# Patient Record
Sex: Female | Born: 1975 | Race: White | Hispanic: No | Smoking: Former smoker
Health system: Southern US, Community
[De-identification: ages and names within clinical notes are randomized; demographics above are authoritative.]

## PROBLEM LIST (undated history)

## (undated) DIAGNOSIS — F32A Depression, unspecified: Secondary | ICD-10-CM

## (undated) DIAGNOSIS — I2699 Other pulmonary embolism without acute cor pulmonale: Secondary | ICD-10-CM

## (undated) DIAGNOSIS — R569 Unspecified convulsions: Secondary | ICD-10-CM

## (undated) DIAGNOSIS — M1990 Unspecified osteoarthritis, unspecified site: Secondary | ICD-10-CM

## (undated) DIAGNOSIS — J45909 Unspecified asthma, uncomplicated: Secondary | ICD-10-CM

## (undated) DIAGNOSIS — F419 Anxiety disorder, unspecified: Secondary | ICD-10-CM

## (undated) DIAGNOSIS — Z7901 Long term (current) use of anticoagulants: Secondary | ICD-10-CM

## (undated) DIAGNOSIS — I82409 Acute embolism and thrombosis of unspecified deep veins of unspecified lower extremity: Secondary | ICD-10-CM

## (undated) DIAGNOSIS — G43909 Migraine, unspecified, not intractable, without status migrainosus: Secondary | ICD-10-CM

## (undated) HISTORY — DX: Unspecified asthma, uncomplicated: J45.909

## (undated) HISTORY — DX: Anxiety disorder, unspecified: F41.9

## (undated) HISTORY — DX: Depression, unspecified: F32.A

## (undated) HISTORY — DX: Migraine, unspecified, not intractable, without status migrainosus: G43.909

## (undated) HISTORY — DX: Long term (current) use of anticoagulants: Z79.01

---

## 2002-02-22 ENCOUNTER — Ambulatory Visit (HOSPITAL_COMMUNITY): Admission: RE | Admit: 2002-02-22 | Discharge: 2002-02-22 | Payer: Self-pay | Admitting: *Deleted

## 2002-02-22 ENCOUNTER — Encounter: Payer: Self-pay | Admitting: *Deleted

## 2002-04-28 ENCOUNTER — Ambulatory Visit (HOSPITAL_COMMUNITY): Admission: RE | Admit: 2002-04-28 | Discharge: 2002-04-28 | Payer: Self-pay | Admitting: *Deleted

## 2002-04-28 ENCOUNTER — Encounter: Payer: Self-pay | Admitting: *Deleted

## 2002-07-06 ENCOUNTER — Ambulatory Visit (HOSPITAL_COMMUNITY): Admission: AD | Admit: 2002-07-06 | Discharge: 2002-07-06 | Payer: Self-pay | Admitting: *Deleted

## 2002-07-09 ENCOUNTER — Inpatient Hospital Stay (HOSPITAL_COMMUNITY): Admission: AD | Admit: 2002-07-09 | Discharge: 2002-07-11 | Payer: Self-pay | Admitting: *Deleted

## 2008-03-28 ENCOUNTER — Emergency Department (HOSPITAL_COMMUNITY): Admission: EM | Admit: 2008-03-28 | Discharge: 2008-03-29 | Payer: Self-pay | Admitting: Emergency Medicine

## 2010-03-07 ENCOUNTER — Ambulatory Visit: Payer: Self-pay | Admitting: Cardiology

## 2010-11-23 NOTE — H&P (Signed)
NAMEARRIONA, PREST                           ACCOUNT NO.:  [id number]   MEDICAL RECORD NO.:  [id number]                   PATIENT TYPE:  OIB   LOCATION:  A415                                 FACILITY:  [facility]   PHYSICIAN:  Langley Gauss, M.D.                DATE OF BIRTH:  04-Sep-1975   DATE OF ADMISSION:  07/06/2002  DATE OF DISCHARGE:  07/06/2002                                HISTORY & PHYSICAL   HISTORY OF PRESENT ILLNESS:  This is a 35 year old gravida 3, para 2 at 12-  5/[redacted] weeks gestation who was seen in the office for a scheduled visit at  which time she complains of mostly back pain with occasional shooting of  this pain down into her legs.  The patient states that four days prior to  today she did have some significant menstrual-type cramping which has since  abated.  She denies any change in vaginal discharge, no leakage of fluid, no  vaginal bleeding.  The patient's prenatal course uncomplicated.  She is  known to have a normal glucose tolerance test of 112, O positive blood type,  has had serial ultrasounds which have documented adequate fetal growth and  normal anatomic survey.  The patient did have a first-trimester ultrasound  when she was complaining of left lower quadrant pelvic pain.  However, this  revealed a normal intrauterine pregnancy.  The patient does have a history  of depression.  She did restart Zoloft 50 mg p.o. q.h.s. on Dec 02, 2001.   PAST OBSTETRICAL HISTORY:  Vaginal delivery x 2.  Delivered at 37 and 38  weeks, 7 pounds 4 ounces, 8 pounds 1 ounce.  No problems or complications at  either labor or delivery.   PAST MEDICAL HISTORY:  Otherwise negative.   ALLERGIES:  No known drug allergies.   SOCIAL HISTORY:  The patient was smoking less than one pack per day at the  onset of the pregnancy and did plan on quitting.  Father of the baby is  named Jonny Ruiz, who works for [employer].  The patient herself is employed  in the home only.   PHYSICAL  EXAMINATION:  GENERAL:  In no acute distress.  VITAL SIGNS:  Height 5 feet 7-1/2 inches.  Prepregnancy weight 170, most  recent weight 253.  Blood pressure 132/76, pulse rate of 80, respiratory  rate is 20.  HEENT:  Negative.  No adenopathy.  NECK:  Supple. Thyroid is nonpalpable.  LUNGS:  Clear.  CARDIOVASCULAR:  Regular rate and rhythm.  ABDOMEN:  Soft and nontender.  No surgical scars are identified.  The  patient is vertex presentation by Leopold's maneuvers.  EXTREMITIES:  Normal, with only trace pretibial edema.  PELVIC:  Cervix 4 cm dilated, 60% effaced, vertex at a -2 station but well  applied to the cervix.  Fetal heart tones are auscultated in the 150s.    ASSESSMENT:  A  37-5/7 weeks intrauterine pregnancy, possibly in early labor  with cervical dilatation of 4 cm.  Will refer her to [facility] for  evaluation.  If the patient is noted to be having regular uterine  contractions and documents cervical change, will consider her to be in labor  and proceed with amniotomy in anticipation of delivery.                                               Langley Gauss, M.D.    DC/MEDQ  D:  07/06/2002  T:  07/06/2002  Job:  [id number]

## 2010-11-23 NOTE — H&P (Signed)
   Jaime Massey, Jaime Massey                         ACCOUNT NO.:  [id number]   MEDICAL RECORD NO.:  [id number]                   PATIENT TYPE:  INP   LOCATION:  A420                                 FACILITY:  [facility]   PHYSICIAN:  Langley Gauss, M.D.                DATE OF BIRTH:  11/20/1975   DATE OF ADMISSION:  07/09/2002  DATE OF DISCHARGE:                                HISTORY & PHYSICAL   ADDENDUM:  The patient was admitted on July 09, 2002 for induction of  labor.  Pertinently, the patient had been sent to [facility] on  July 06, 2002 with possible prodromal labor; however, she was determined  not to be in labor at that time and was discharged to home.  The patient's  plan of course and past medical history is unchanged since dictated history  and physical.  On examination today, there was noted to be a reassuring  fetal heart rate, cervix 5 cm dilated, 80% effaced, -1 station posterior  position noted.  A fetal scalp electrode was used with resultant rupture of  membranes.  Clear amniotic fluid is noted.   PLAN:  The patient will be observed for several hours for spontaneous labor,  and thereafter will be induced or augmented if clinically indicated.                                               Langley Gauss, M.D.    DC/MEDQ  D:  07/09/2002  T:  07/09/2002  Job:  [id number]

## 2010-11-23 NOTE — Op Note (Signed)
   NAMEALETTA, EDMUNDS                         ACCOUNT NO.:  [id number]   MEDICAL RECORD NO.:  [id number]                   PATIENT TYPE:  OIB   LOCATION:  A415                                 FACILITY:  [facility]   PHYSICIAN:  Langley Gauss, M.D.                DATE OF BIRTH:  10-15-1975   DATE OF PROCEDURE:  07/06/2002  DATE OF DISCHARGE:  07/06/2002                                 OPERATIVE REPORT   PROCEDURE:  Non-stress test interpretation.   DISCHARGE DIAGNOSIS:  Pregnancy at 37-5/7 weeks intrauterine pregnancy with  abdominal pain.   INTERPRETATION:  The patient was placed on an external fetal monitor  complaining of irregular uterine contractions.  The patient was noted to  have a fetal heart rate baseline of 140-155 with accelerations noted of  greater than 15 beats per minute times greater than 15 seconds' duration.  No fetal heart rate decelerations are noted.  Long-term variability is noted  to be normal.  External toco reveals no significant uterine activity  identified.   ASSESSMENT:  A 37-5/7 weeks' intrauterine pregnancy complaining of abdominal  pain with reactive non-stress test.  Non-stress test interpretation only.                                               Langley Gauss, M.D.    DC/MEDQ  D:  08/03/2002  T:  08/03/2002  Job:  [id number]

## 2010-11-23 NOTE — Discharge Summary (Signed)
   Jaime Massey, Jaime Massey                         ACCOUNT NO.:  [id number]   MEDICAL RECORD NO.:  [id number]                   PATIENT TYPE:  INP   LOCATION:  A420                                 FACILITY:  [facility]   PHYSICIAN:  Langley Gauss, M.D.                DATE OF BIRTH:  1976-05-17   DATE OF ADMISSION:  07/09/2002  DATE OF DISCHARGE:  07/11/2002                                 DISCHARGE SUMMARY   DIAGNOSIS:  A 38-week intrauterine pregnancy delivered.   DISPOSITION:  The patient is to follow up in the office in four weeks' time.  She is bottle feeding at time of discharge.   DISCHARGE MEDICATIONS:  1. Tylenol No.3.  2. Motrin.  3. HCTZ 25 mg p.o. daily p.r.n. fluid retention #30 with no refill.   PERTINENT LABORATORY STUDIES:  O+ blood type.  Hemoglobin 10.6, hematocrit  30.4, with a white count of 13.7.  RPR is nonreactive.   COMPLICATIONS OF HOSPITALIZATION:  Postpartum hemorrhage with estimated  blood loss of 600 cc.   HOSPITAL COURSE:  See previous dictation.  The patient did well postpartum  with no postpartum complications.  Ambulated well, was able to void without  difficulty, bonding well with the infant.  The patient was discharged in my  absence on July 11, 2002 according to cross-coverage call arrangements.                                               Langley Gauss, M.D.    DC/MEDQ  D:  07/16/2002  T:  07/17/2002  Job:  [id number]

## 2010-11-23 NOTE — Op Note (Signed)
Jaime Massey, Jaime Massey                         ACCOUNT NO.:  [id number]   MEDICAL RECORD NO.:  [id number]                   PATIENT TYPE:  INP   LOCATION:  A420                                 FACILITY:  [facility]   PHYSICIAN:  Langley Gauss, M.D.                DATE OF BIRTH:  May 13, 1976   DATE OF PROCEDURE:  07/09/2002  DATE OF DISCHARGE:  07/11/2002                                 OPERATIVE REPORT   DIAGNOSIS:  38-week intrauterine pregnancy for induction of labor.  Delivery  performed with spontaneous assisted vaginal delivery, 8 lb, 3.5 oz, female  infant, delivered over an intact perineum.   ANALGESIA FOR DELIVERY:  The patient was treated during the course of labor  with IV Nubain only.   Delivery was complicated by a postpartum hemorrhage secondary to uterine  atony with a total estimated blood loss of 600 cc.  This was managed with IV  Pitocin solution, as well as 1 ampule of IM Hemabate.   SUMMARY:  The patient admitted at [redacted] weeks gestation for induction of labor.  Amniotomy was performed with findings of clear amniotic fluid.  A fetal  scalp electrode was placed.  This documented a reassuring fetal heart rate  throughout the course of labor.  The patient progressed rapidly along the  labor curve to complete dilatation, after which time she was placed in the  dorsal lithotomy position, prepped and draped in the usual sterile manner.  She pushed well during the short second stage of labor and delivered in a  direct OA position over the intact perineum.  The mouth and the nares of the  infant were bulb suctioned of clear amniotic fluid.  Renewed expulsive  efforts resulted in spontaneous rotation to a left anterior shoulder  position.  Gentle downward traction, combined with expulsive efforts  resulted in delivery of the shoulders beneath the pubis symphysis without  difficulty.  The mouth and nares were bulb suctioned of clear amniotic  fluid.  Continued expulsive efforts  resulted in the delivery of the  remainder of the infant without difficulty.  A spontaneous and vigorous  breathe and cry was noted.  The umbilical cord was milked towards the  infant.  The cord was doubly clamped and cut, and the infant was placed on  the maternal abdomen for immediate bonding purposes.  Arterial cord gas and  cord blood were then obtained.  Gentle traction on the umbilical cord  resulted in separation, which upon examination appears to be an intact  placenta with attached three-vessel umbilical cord.  There was the inherent  delay in initiation of the IV Pitocin solution, following delivery of the  placenta.  The initial atony was managed utilizing bimanual massage of the  uterus with expression of several large clots.  The IV Pitocin solution was  then initiated.  Excellent uterine tone was achieved; however, atony was  again encountered.  Thus,  the patient was treated at that time with IM  Hemabate.  The patient thereafter had excellent control of her bleeding with  no excessive bleeding thereafter noted.  Examination of the genital tract  reveals no lacerations, and specifically, the perineum was noted to be  intact.  Thus, the patient is taken out of the dorsal lithotomy position and  allowed to bond with the infant.  No complications of delivery.                                               Langley Gauss, M.D.    DC/MEDQ  D:  07/16/2002  T:  07/17/2002  Job:  [id number]

## 2012-06-17 ENCOUNTER — Emergency Department (HOSPITAL_COMMUNITY)
Admission: EM | Admit: 2012-06-17 | Discharge: 2012-06-17 | Disposition: A | Discharge status: Home / Self Care | Payer: Self-pay | Attending: Emergency Medicine | Admitting: Emergency Medicine

## 2012-06-17 ENCOUNTER — Encounter (HOSPITAL_COMMUNITY): Payer: Self-pay | Admitting: *Deleted

## 2012-06-17 ENCOUNTER — Emergency Department (HOSPITAL_COMMUNITY): Payer: Self-pay

## 2012-06-17 DIAGNOSIS — M25519 Pain in unspecified shoulder: Secondary | ICD-10-CM | POA: Insufficient documentation

## 2012-06-17 DIAGNOSIS — R079 Chest pain, unspecified: Secondary | ICD-10-CM | POA: Insufficient documentation

## 2012-06-17 DIAGNOSIS — R61 Generalized hyperhidrosis: Secondary | ICD-10-CM | POA: Insufficient documentation

## 2012-06-17 DIAGNOSIS — Z86718 Personal history of other venous thrombosis and embolism: Secondary | ICD-10-CM | POA: Insufficient documentation

## 2012-06-17 DIAGNOSIS — R209 Unspecified disturbances of skin sensation: Secondary | ICD-10-CM | POA: Insufficient documentation

## 2012-06-17 DIAGNOSIS — M549 Dorsalgia, unspecified: Secondary | ICD-10-CM | POA: Insufficient documentation

## 2012-06-17 DIAGNOSIS — R1013 Epigastric pain: Secondary | ICD-10-CM | POA: Insufficient documentation

## 2012-06-17 DIAGNOSIS — R110 Nausea: Secondary | ICD-10-CM | POA: Insufficient documentation

## 2012-06-17 DIAGNOSIS — F17200 Nicotine dependence, unspecified, uncomplicated: Secondary | ICD-10-CM | POA: Insufficient documentation

## 2012-06-17 HISTORY — DX: Acute embolism and thrombosis of unspecified deep veins of unspecified lower extremity: I82.409

## 2012-06-17 LAB — CBC WITH DIFFERENTIAL/PLATELET
Basophils Absolute: 0 10*3/uL (ref 0.0–0.1)
Basophils Relative: 0 % (ref 0–1)
Eosinophils Absolute: 0.1 10*3/uL (ref 0.0–0.7)
Eosinophils Relative: 1 % (ref 0–5)
HCT: 39 % (ref 36.0–46.0)
Hemoglobin: 13.9 g/dL (ref 12.0–15.0)
Lymphocytes Relative: 31 % (ref 12–46)
Lymphs Abs: 2.5 10*3/uL (ref 0.7–4.0)
MCH: 32.6 pg (ref 26.0–34.0)
MCHC: 35.6 g/dL (ref 30.0–36.0)
MCV: 91.5 fL (ref 78.0–100.0)
Monocytes Absolute: 0.4 10*3/uL (ref 0.1–1.0)
Monocytes Relative: 4 % (ref 3–12)
Neutro Abs: 5.3 10*3/uL (ref 1.7–7.7)
Neutrophils Relative %: 64 % (ref 43–77)
Platelets: 164 10*3/uL (ref 150–400)
RBC: 4.26 MIL/uL (ref 3.87–5.11)
RDW: 11.6 % (ref 11.5–15.5)
WBC: 8.3 10*3/uL (ref 4.0–10.5)

## 2012-06-17 LAB — BASIC METABOLIC PANEL
BUN: 6 mg/dL (ref 6–23)
CO2: 28 mEq/L (ref 19–32)
Calcium: 9.4 mg/dL (ref 8.4–10.5)
Chloride: 105 mEq/L (ref 96–112)
Creatinine, Ser: 0.9 mg/dL (ref 0.50–1.10)
GFR calc Af Amer: >90 mL/min (ref >90)
GFR calc non Af Amer: 82 mL/min — ABNORMAL LOW (ref >90)
Glucose, Bld: 85 mg/dL (ref 70–99)
Potassium: 3.9 mEq/L (ref 3.5–5.1)
Sodium: 140 mEq/L (ref 135–145)

## 2012-06-17 LAB — TROPONIN I: Troponin I: <0.3 ng/mL (ref <0.30)

## 2012-06-17 LAB — D-DIMER, QUANTITATIVE: D-Dimer, Quant: <0.27 ug/mL-FEU (ref 0.00–0.48)

## 2012-06-17 MED ORDER — ASPIRIN 81 MG PO CHEW
324.0000 mg | CHEWABLE_TABLET | Freq: Once | ORAL | Status: AC
  Administered 2012-06-17: 324 mg via ORAL
  Filled 2012-06-17: qty 4

## 2012-06-17 NOTE — ED Notes (Addendum)
Pt reporting sharp epigastric pain starting last night. No relief from antacid tablets.  Denies SOB. Reports intermittent nausea at work today.  Reports pain also in back occasionally.

## 2012-06-17 NOTE — ED Provider Notes (Signed)
History  This chart was scribed for Joya Gaskins, MD by Erskine Emery, ED Scribe. This patient was seen in room APA11/APA11 and the patient's care was started at 20:13.   CSN: [id number]  Arrival date & time 06/17/12  1954   First MD Initiated Contact with Patient 06/17/12 2013      No chief complaint on file.  The history is provided by the patient. No language interpreter was used.  Jaime Massey is a 36 y.o. female who presents to the Emergency Department complaining of sharp chest, upper abdominal, back, and shoulder pain since just before bed, while resting last night. Pt reports some associated diaphoresis, intermittent nausea, and bilateral hand numbness, but denies any SOB, fever, cough, diaphoresis, emesis, LOC, lower abdominal pain, flu-like symptoms, or weakness, or swelling in the legs. Pt reports the pain is aggravated by moving, changing positions, and deep breathing. CP is not exertional  She took antacid tablets because she thought it might be indigestion, with no relief from symptoms. Pt reports her shoulder has been bothering her for a couple weeks now. Pt reports one episode of similar symptoms, when she had a blood clot in her leg 2 years ago after her caesarian section.  She was put on coumadin but she has been off it for a year now. She was cared for at [facility].     Past Medical History  Diagnosis Date  . DVT (deep venous thrombosis)     Past Surgical History  Procedure Date  . Cesarean section     History reviewed. No pertinent family history.  History  Substance Use Topics  . Smoking status: Current Every Day Smoker -- 0.5 packs/day  . Smokeless tobacco: Not on file  . Alcohol Use: No    OB History    Grav Para Term Preterm Abortions TAB SAB Ect Mult Living                  Review of Systems  Constitutional: Positive for diaphoresis. Negative for fever.  Respiratory: Negative for cough and shortness of breath.   Cardiovascular: Positive  for chest pain.  Gastrointestinal: Positive for nausea and abdominal pain. Negative for vomiting and diarrhea.  Musculoskeletal: Positive for back pain.  Neurological: Positive for numbness. Negative for syncope and weakness.  All other systems reviewed and are negative.    Allergies  Review of patient's allergies indicates no known allergies.  Home Medications  No current outpatient prescriptions on file.  Triage Vitals: BP 122/70  Pulse 86  Temp 98.3 F (36.8 C) (Oral)  Resp 20  Ht 5\' 9"  (1.753 m)  Wt 206 lb 1 oz (93.469 kg)  BMI 30.43 kg/m2  SpO2 100%  Physical Exam CONSTITUTIONAL: Well developed/well nourished HEAD AND FACE: Normocephalic/atraumatic EYES: EOMI/PERRL ENMT: Mucous membranes moist NECK: supple no meningeal signs SPINE:entire spine nontender CV: S1/S2 noted, no murmurs/rubs/gallops noted LUNGS: Lungs are clear to auscultation bilaterally, no apparent distress CHEST: Chest mildly tender ABDOMEN: soft, nontender, no rebound or guarding GU:no cva tenderness NEURO: Pt is awake/alert, moves all extremitiesx4, no focal motor or sensory deficits noted in her extremities EXTREMITIES: pulses normal, full ROM SKIN: warm, color normal PSYCH: no abnormalities of mood noted   ED Course  Procedures  DIAGNOSTIC STUDIES: Oxygen Saturation is 100% on room air, normal by my interpretation.    COORDINATION OF CARE: 20:52--I evaluated the patient and we discussed a treatment plan including x-ray and blood work to which the pt agreed.   21:59--I  rechecked the pt. She says her pain has decreased a little but is still present, as a pressure in her mid chest. We discussed a further treatment plan and she would like a d-dimer for more information.  Pt well appearing. On re-exam, suspicion for ACS is low as pain atypical and at times is reproducible.  Will not repeat troponin.  EKG unremarkable.  ddimer negative, PE less likely given exam/vitals.    Results for orders  placed during the hospital encounter of 06/17/12  BASIC METABOLIC PANEL      Component Value Range   Sodium 140  135 - 145 mEq/L   Potassium 3.9  3.5 - 5.1 mEq/L   Chloride 105  96 - 112 mEq/L   CO2 28  19 - 32 mEq/L   Glucose, Bld 85  70 - 99 mg/dL   BUN 6  6 - 23 mg/dL   Creatinine, Ser 5.78  0.50 - 1.10 mg/dL   Calcium 9.4  8.4 - 46.9 mg/dL   GFR calc non Af Amer 82 (*) >90 mL/min   GFR calc Af Amer >90  >90 mL/min  CBC WITH DIFFERENTIAL      Component Value Range   WBC 8.3  4.0 - 10.5 K/uL   RBC 4.26  3.87 - 5.11 MIL/uL   Hemoglobin 13.9  12.0 - 15.0 g/dL   HCT 62.9  52.8 - 41.3 %   MCV 91.5  78.0 - 100.0 fL   MCH 32.6  26.0 - 34.0 pg   MCHC 35.6  30.0 - 36.0 g/dL   RDW 24.4  01.0 - 27.2 %   Platelets 164  150 - 400 K/uL   Neutrophils Relative 64  43 - 77 %   Neutro Abs 5.3  1.7 - 7.7 K/uL   Lymphocytes Relative 31  12 - 46 %   Lymphs Abs 2.5  0.7 - 4.0 K/uL   Monocytes Relative 4  3 - 12 %   Monocytes Absolute 0.4  0.1 - 1.0 K/uL   Eosinophils Relative 1  0 - 5 %   Eosinophils Absolute 0.1  0.0 - 0.7 K/uL   Basophils Relative 0  0 - 1 %   Basophils Absolute 0.0  0.0 - 0.1 K/uL  TROPONIN I      Component Value Range   Troponin I <0.30  <0.30 ng/mL  D-DIMER, QUANTITATIVE      Component Value Range   D-Dimer, Quant <0.27  0.00 - 0.48 ug/mL-FEU   Dg Chest 2 View  06/17/2012  *RADIOLOGY REPORT*  Clinical Data: Chest pain radiating to the left arm.  History of DVT.  CHEST - 2 VIEW  Comparison: None.  Findings: The heart size and pulmonary vascularity are normal. The lungs appear clear and expanded without focal air space disease or consolidation. No blunting of the costophrenic angles.  No pneumothorax.  Mediastinal contours appear intact.  IMPRESSION: No evidence of active pulmonary disease.   Original Report Authenticated By: Burman Nieves, M.D.        MDM  Nursing notes including past medical history and social history reviewed and considered in  documentation Labs/vital reviewed and considered xrays reviewed and considered      Date: 06/17/2012  Rate: 73  Rhythm: normal sinus rhythm  QRS Axis: normal  Intervals: normal  ST/T Wave abnormalities: nonspecific ST changes  Conduction Disutrbances:none  Narrative Interpretation:      I personally performed the services described in this documentation, which was scribed in my presence.  The recorded information has been reviewed and is accurate.      Joya Gaskins, MD 06/17/12 [phone]

## 2012-12-07 ENCOUNTER — Emergency Department (HOSPITAL_COMMUNITY): Payer: Medicaid Other

## 2012-12-07 ENCOUNTER — Emergency Department (HOSPITAL_COMMUNITY)
Admission: EM | Admit: 2012-12-07 | Discharge: 2012-12-07 | Disposition: A | Discharge status: Home / Self Care | Payer: Medicaid Other | Attending: Emergency Medicine | Admitting: Emergency Medicine

## 2012-12-07 ENCOUNTER — Encounter (HOSPITAL_COMMUNITY): Payer: Self-pay | Admitting: *Deleted

## 2012-12-07 DIAGNOSIS — R109 Unspecified abdominal pain: Secondary | ICD-10-CM | POA: Insufficient documentation

## 2012-12-07 DIAGNOSIS — Z86711 Personal history of pulmonary embolism: Secondary | ICD-10-CM | POA: Insufficient documentation

## 2012-12-07 DIAGNOSIS — Z3202 Encounter for pregnancy test, result negative: Secondary | ICD-10-CM | POA: Insufficient documentation

## 2012-12-07 DIAGNOSIS — R10811 Right upper quadrant abdominal tenderness: Secondary | ICD-10-CM | POA: Insufficient documentation

## 2012-12-07 DIAGNOSIS — Z86718 Personal history of other venous thrombosis and embolism: Secondary | ICD-10-CM | POA: Insufficient documentation

## 2012-12-07 DIAGNOSIS — F17200 Nicotine dependence, unspecified, uncomplicated: Secondary | ICD-10-CM | POA: Insufficient documentation

## 2012-12-07 DIAGNOSIS — R197 Diarrhea, unspecified: Secondary | ICD-10-CM | POA: Insufficient documentation

## 2012-12-07 HISTORY — DX: Other pulmonary embolism without acute cor pulmonale: I26.99

## 2012-12-07 LAB — CBC WITH DIFFERENTIAL/PLATELET
Basophils Absolute: 0 10*3/uL (ref 0.0–0.1)
Basophils Relative: 1 % (ref 0–1)
HCT: 44.2 % (ref 36.0–46.0)
Lymphocytes Relative: 17 % (ref 12–46)
MCHC: 35.5 g/dL (ref 30.0–36.0)
Monocytes Absolute: 0.3 10*3/uL (ref 0.1–1.0)
Neutro Abs: 6.2 10*3/uL (ref 1.7–7.7)
Neutrophils Relative %: 78 % — ABNORMAL HIGH (ref 43–77)
RDW: 11.6 % (ref 11.5–15.5)
WBC: 7.9 10*3/uL (ref 4.0–10.5)

## 2012-12-07 LAB — COMPREHENSIVE METABOLIC PANEL
ALT: 16 U/L (ref 0–35)
AST: 18 U/L (ref 0–37)
Albumin: 4.7 g/dL (ref 3.5–5.2)
Alkaline Phosphatase: 72 U/L (ref 39–117)
CO2: 25 mEq/L (ref 19–32)
Chloride: 105 mEq/L (ref 96–112)
Creatinine, Ser: 0.83 mg/dL (ref 0.50–1.10)
GFR calc non Af Amer: 90 mL/min — ABNORMAL LOW (ref >90)
Potassium: 3.9 mEq/L (ref 3.5–5.1)
Sodium: 140 mEq/L (ref 135–145)
Total Bilirubin: 0.3 mg/dL (ref 0.3–1.2)

## 2012-12-07 LAB — URINALYSIS, ROUTINE W REFLEX MICROSCOPIC
Glucose, UA: NEGATIVE mg/dL
Leukocytes, UA: NEGATIVE
pH: 6 (ref 5.0–8.0)

## 2012-12-07 MED ORDER — PROMETHAZINE HCL 25 MG PO TABS: 25.0000 mg | ORAL_TABLET | Freq: Four times a day (QID) | ORAL | Status: DC | PRN

## 2012-12-07 MED ORDER — HYDROCODONE-ACETAMINOPHEN 5-325 MG PO TABS: 1.0000 | ORAL_TABLET | Freq: Four times a day (QID) | ORAL | Status: DC | PRN

## 2012-12-07 NOTE — ED Provider Notes (Signed)
History     This chart was scribed for Benny Lennert, MD, MD by Smitty Pluck, ED Scribe. The patient was seen in room APA05/APA05 and the patient's care was started at 2:54 PM.   CSN: [id number]  Arrival date & time 12/07/12  1051      Chief Complaint  Patient presents with  . Abdominal Pain     Patient is a 37 y.o. female presenting with abdominal pain. The history is provided by the patient and medical records. No language interpreter was used.  Abdominal Pain This is a chronic problem. The current episode started more than 1 week ago. The problem occurs every several days. The problem has not changed since onset.Associated symptoms include abdominal pain. Pertinent negatives include no chest pain and no headaches. Nothing aggravates the symptoms. Nothing relieves the symptoms. She has tried nothing for the symptoms.   HPI Comments: Jaime Massey is a 37 y.o. female who presents to the Emergency Department complaining of abdominal pain that has been ongoing since November 2013 but has worsened due to having diarrhea for the past 13 hours. Pt states that she has been seen before in the past for similar and was told she had GERD but Prilosec did not relieve symptoms. She reports family hx of gallstones. Pt denies fever, chills, nausea, vomiting, diarrhea, weakness, cough, SOB and any other pain. She denies hx of cholecystectomy.      Past Medical History  Diagnosis Date  . DVT (deep venous thrombosis)   . PE (pulmonary embolism)     Past Surgical History  Procedure Laterality Date  . Cesarean section      History reviewed. No pertinent family history.  History  Substance Use Topics  . Smoking status: Current Every Day Smoker -- 0.50 packs/day  . Smokeless tobacco: Not on file  . Alcohol Use: No    OB History   Grav Para Term Preterm Abortions TAB SAB Ect Mult Living                  Review of Systems  Constitutional: Negative for appetite change and fatigue.   HENT: Negative for congestion, sinus pressure and ear discharge.   Eyes: Negative for discharge.  Respiratory: Negative for cough.   Cardiovascular: Negative for chest pain.  Gastrointestinal: Positive for abdominal pain and diarrhea.  Genitourinary: Negative for frequency and hematuria.  Musculoskeletal: Negative for back pain.  Skin: Negative for rash.  Neurological: Negative for seizures and headaches.  Psychiatric/Behavioral: Negative for hallucinations.    Allergies  Review of patient's allergies indicates no known allergies.  Home Medications   Current Outpatient Rx  Name  Route  Sig  Dispense  Refill  . acetaminophen (TYLENOL) 500 MG tablet   Oral   Take 500 mg by mouth daily as needed. For pain         . calcium carbonate (TUMS - DOSED IN MG ELEMENTAL CALCIUM) 500 MG chewable tablet   Oral   Chew 1 tablet by mouth daily as needed. For indigestion/stomach upset           BP 106/64  Pulse 89  Temp(Src) 98 F (36.7 C) (Oral)  Resp 18  Ht 5\' 9"  (1.753 m)  Wt 191 lb (86.637 kg)  BMI 28.19 kg/m2  SpO2 98%  Physical Exam  Nursing note and vitals reviewed. Constitutional: She is oriented to person, place, and time. She appears well-developed.  HENT:  Head: Normocephalic.  Eyes: Conjunctivae and EOM are normal. No  scleral icterus.  Neck: Neck supple. No thyromegaly present.  Cardiovascular: Normal rate and regular rhythm.  Exam reveals no gallop and no friction rub.   No murmur heard. Pulmonary/Chest: No stridor. She has no wheezes. She has no rales. She exhibits no tenderness.  Abdominal: She exhibits no distension. There is tenderness (mild) in the right upper quadrant. There is no rebound.  Musculoskeletal: Normal range of motion. She exhibits no edema.  Lymphadenopathy:    She has no cervical adenopathy.  Neurological: She is oriented to person, place, and time. Coordination normal.  Skin: No rash noted. No erythema.  Psychiatric: She has a normal mood  and affect. Her behavior is normal.    ED Course  Procedures (including critical care time) DIAGNOSTIC STUDIES: Oxygen Saturation is 98% on room air, normal by my interpretation.    COORDINATION OF CARE: 3:15 PM Discussed ED treatment with pt and pt agrees.      Labs Reviewed  CBC WITH DIFFERENTIAL - Abnormal; Notable for the following:    Hemoglobin 15.7 (*)    Neutrophils Relative % 78 (*)    All other components within normal limits  COMPREHENSIVE METABOLIC PANEL - Abnormal; Notable for the following:    GFR calc non Af Amer 90 (*)    All other components within normal limits  URINALYSIS, ROUTINE W REFLEX MICROSCOPIC - Abnormal; Notable for the following:    Hgb urine dipstick SMALL (*)    Bilirubin Urine SMALL (*)    Ketones, ur TRACE (*)    Protein, ur TRACE (*)    All other components within normal limits  AMYLASE  URINE MICROSCOPIC-ADD ON  POCT PREGNANCY, URINE   No results found.   No diagnosis found.    MDM   Spoke with surgeon who will follow up this week.  The chart was scribed for me under my direct supervision.  I personally performed the history, physical, and medical decision making and all procedures in the evaluation of this patient.Benny Lennert, MD 12/07/12 1705

## 2012-12-07 NOTE — ED Notes (Signed)
Patient with no complaints at this time. Respirations even and unlabored. Skin warm/dry. Discharge instructions reviewed with patient at this time. Patient given opportunity to voice concerns/ask questions. Patient discharged at this time and left Emergency Department with steady gait.   

## 2012-12-07 NOTE — ED Notes (Addendum)
Pain RUQ for 7 mos, seen here to eval for PE that was negative.Her doctor placed her on prilosec for reflux , has not been eval for gall bladder problem.  And has family hx of gall bladder problems.  Nausea, no vomiting, Has had diarrhea

## 2012-12-11 ENCOUNTER — Encounter (HOSPITAL_COMMUNITY): Payer: Self-pay | Admitting: Pharmacy Technician

## 2012-12-16 ENCOUNTER — Encounter (HOSPITAL_COMMUNITY): Payer: Self-pay

## 2012-12-16 ENCOUNTER — Encounter (HOSPITAL_COMMUNITY)
Admission: RE | Admit: 2012-12-16 | Discharge: 2012-12-16 | Disposition: A | Discharge status: Home / Self Care | Payer: Medicaid Other | Source: Ambulatory Visit | Attending: General Surgery | Admitting: General Surgery

## 2012-12-16 HISTORY — DX: Unspecified osteoarthritis, unspecified site: M19.90

## 2012-12-16 HISTORY — DX: Unspecified convulsions: R56.9

## 2012-12-16 LAB — SURGICAL PCR SCREEN
MRSA, PCR: NEGATIVE
Staphylococcus aureus: NEGATIVE

## 2012-12-16 NOTE — Patient Instructions (Addendum)
AOWYN ROZEBOOM  12/16/2012   Your procedure is scheduled on:   12/21/2012  Report to [facility] at  615  AM.  Call this number if you have problems the morning of surgery: [phone]   Remember:   Do not eat food or drink liquids after midnight.   Take these medicines the morning of surgery with A SIP OF WATER: norco,phenergan   Do not wear jewelry, make-up or nail polish.  Do not wear lotions, powders, or perfumes.   Do not shave 48 hours prior to surgery. Men may shave face and neck.  Do not bring valuables to the hospital.  [facility] is not responsible for any belongings or valuables.  Contacts, dentures or bridgework may not be worn into surgery.  Leave suitcase in the car. After surgery it may be brought to your room.  For patients admitted to the hospital, checkout time is 11:00 AM the day of discharge.   Patients discharged the day of surgery will not be allowed to drive  home.  Name and phone number of your driver: family  Special Instructions: Shower using CHG 2 nights before surgery and the night before surgery.  If you shower the day of surgery use CHG.  Use special wash - you have one bottle of CHG for all showers.  You should use approximately 1/3 of the bottle for each shower.   Please read over the following fact sheets that you were given: Pain Booklet, Coughing and Deep Breathing, MRSA Information, Surgical Site Infection Prevention, Anesthesia Post-op Instructions and Care and Recovery After Surgery Laparoscopic Cholecystectomy Laparoscopic cholecystectomy is surgery to remove the gallbladder. The gallbladder is located slightly to the right of center in the abdomen, behind the liver. It is a concentrating and storage sac for the bile produced in the liver. Bile aids in the digestion and absorption of fats. Gallbladder disease (cholecystitis) is an inflammation of your gallbladder. This condition is usually caused by a buildup of gallstones (cholelithiasis) in your  gallbladder. Gallstones can block the flow of bile, resulting in inflammation and pain. In severe cases, emergency surgery may be required. When emergency surgery is not required, you will have time to prepare for the procedure. Laparoscopic surgery is an alternative to open surgery. Laparoscopic surgery usually has a shorter recovery time. Your common bile duct may also need to be examined and explored. Your caregiver will discuss this with you if he or she feels this should be done. If stones are found in the common bile duct, they may be removed. LET YOUR CAREGIVER KNOW ABOUT:  Allergies to food or medicine.  Medicines taken, including vitamins, herbs, eyedrops, over-the-counter medicines, and creams.  Use of steroids (by mouth or creams).  Previous problems with anesthetics or numbing medicines.  History of bleeding problems or blood clots.  Previous surgery.  Other health problems, including diabetes and kidney problems.  Possibility of pregnancy, if this applies. RISKS AND COMPLICATIONS All surgery is associated with risks. Some problems that may occur following this procedure include:  Infection.  Damage to the common bile duct, nerves, arteries, veins, or other internal organs such as the stomach or intestines.  Bleeding.  A stone may remain in the common bile duct. BEFORE THE PROCEDURE  Do not take aspirin for 3 days prior to surgery or blood thinners for 1 week prior to surgery.  Do not eat or drink anything after midnight the night before surgery.  Let your caregiver know if  you develop a cold or other infectious problem prior to surgery.  You should be present 60 minutes before the procedure or as directed. PROCEDURE  You will be given medicine that makes you sleep (general anesthetic). When you are asleep, your surgeon will make several small cuts (incisions) in your abdomen. One of these incisions is used to insert a small, lighted scope (laparoscope) into the  abdomen. The laparoscope helps the surgeon see into your abdomen. Carbon dioxide gas will be pumped into your abdomen. The gas allows more room for the surgeon to perform your surgery. Other operating instruments are inserted through the other incisions. Laparoscopic procedures may not be appropriate when:  There is major scarring from previous surgery.  The gallbladder is extremely inflamed.  There are bleeding disorders or unexpected cirrhosis of the liver.  A pregnancy is near term.  Other conditions make the laparoscopic procedure impossible. If your surgeon feels it is not safe to continue with a laparoscopic procedure, he or she will perform an open abdominal procedure. In this case, the surgeon will make an incision to open the abdomen. This gives the surgeon a larger view and field to work within. This may allow the surgeon to perform procedures that sometimes cannot be performed with a laparoscope alone. Open surgery has a longer recovery time. AFTER THE PROCEDURE  You will be taken to the recovery area where a nurse will watch and check your progress.  You may be allowed to go home the same day.  Do not resume physical activities until directed by your caregiver.  You may resume a normal diet and activities as directed. Document Released: 06/24/2005 Document Revised: 09/16/2011 Document Reviewed: 12/07/2010 [facility] Patient Information 2014 [place], [place]. PATIENT INSTRUCTIONS POST-ANESTHESIA  IMMEDIATELY FOLLOWING SURGERY:  Do not drive or operate machinery for the first twenty four hours after surgery.  Do not make any important decisions for twenty four hours after surgery or while taking narcotic pain medications or sedatives.  If you develop intractable nausea and vomiting or a severe headache please notify your doctor immediately.  FOLLOW-UP:  Please make an appointment with your surgeon as instructed. You do not need to follow up with anesthesia unless specifically  instructed to do so.  WOUND CARE INSTRUCTIONS (if applicable):  Keep a dry clean dressing on the anesthesia/puncture wound site if there is drainage.  Once the wound has quit draining you may leave it open to air.  Generally you should leave the bandage intact for twenty four hours unless there is drainage.  If the epidural site drains for more than 36-48 hours please call the anesthesia department.  QUESTIONS?:  Please feel free to call your physician or the hospital operator if you have any questions, and they will be happy to assist you.

## 2012-12-21 ENCOUNTER — Ambulatory Visit (HOSPITAL_COMMUNITY): Payer: Medicaid Other | Admitting: Anesthesiology

## 2012-12-21 ENCOUNTER — Ambulatory Visit (HOSPITAL_COMMUNITY)
Admission: RE | Admit: 2012-12-21 | Discharge: 2012-12-21 | Disposition: A | Discharge status: Home / Self Care | Payer: Medicaid Other | Source: Ambulatory Visit | Attending: General Surgery | Admitting: General Surgery

## 2012-12-21 ENCOUNTER — Encounter (HOSPITAL_COMMUNITY): Payer: Self-pay | Admitting: Anesthesiology

## 2012-12-21 ENCOUNTER — Encounter (HOSPITAL_COMMUNITY)
Admission: RE | Disposition: A | Discharge status: Home / Self Care | Payer: Self-pay | Source: Ambulatory Visit | Attending: General Surgery

## 2012-12-21 DIAGNOSIS — K8020 Calculus of gallbladder without cholecystitis without obstruction: Secondary | ICD-10-CM | POA: Insufficient documentation

## 2012-12-21 DIAGNOSIS — K8000 Calculus of gallbladder with acute cholecystitis without obstruction: Secondary | ICD-10-CM

## 2012-12-21 HISTORY — PX: CHOLECYSTECTOMY: SHX55

## 2012-12-21 SURGERY — LAPAROSCOPIC CHOLECYSTECTOMY
Anesthesia: General | Site: Abdomen | Wound class: Clean Contaminated

## 2012-12-21 MED ORDER — FENTANYL CITRATE 0.05 MG/ML IJ SOLN
INTRAMUSCULAR | Status: AC
  Filled 2012-12-21: qty 2

## 2012-12-21 MED ORDER — LIDOCAINE HCL (CARDIAC) 20 MG/ML IV SOLN
INTRAVENOUS | Status: DC | PRN
  Administered 2012-12-21: 30 mg via INTRAVENOUS

## 2012-12-21 MED ORDER — FENTANYL CITRATE 0.05 MG/ML IJ SOLN
25.0000 ug | INTRAMUSCULAR | Status: DC | PRN
  Administered 2012-12-21 (×2): 50 ug via INTRAVENOUS

## 2012-12-21 MED ORDER — PROPOFOL 10 MG/ML IV BOLUS: INTRAVENOUS | Status: DC | PRN

## 2012-12-21 MED ORDER — PROPOFOL 10 MG/ML IV BOLUS
INTRAVENOUS | Status: DC | PRN
  Administered 2012-12-21: 150 mg via INTRAVENOUS

## 2012-12-21 MED ORDER — ONDANSETRON HCL 4 MG/2ML IJ SOLN
4.0000 mg | Freq: Once | INTRAMUSCULAR | Status: AC
  Administered 2012-12-21: 4 mg via INTRAVENOUS

## 2012-12-21 MED ORDER — FENTANYL CITRATE 0.05 MG/ML IJ SOLN
INTRAMUSCULAR | Status: DC | PRN
  Administered 2012-12-21 (×7): 50 ug via INTRAVENOUS

## 2012-12-21 MED ORDER — MIDAZOLAM HCL 2 MG/2ML IJ SOLN
INTRAMUSCULAR | Status: AC
  Filled 2012-12-21: qty 2

## 2012-12-21 MED ORDER — LIDOCAINE HCL (PF) 1 % IJ SOLN
INTRAMUSCULAR | Status: AC
  Filled 2012-12-21: qty 2

## 2012-12-21 MED ORDER — HYDROCODONE-ACETAMINOPHEN 5-325 MG PO TABS: 1.0000 | ORAL_TABLET | Freq: Four times a day (QID) | ORAL | Status: DC | PRN

## 2012-12-21 MED ORDER — NEOSTIGMINE METHYLSULFATE 1 MG/ML IJ SOLN
INTRAMUSCULAR | Status: DC | PRN
  Administered 2012-12-21: 3 mg via INTRAVENOUS

## 2012-12-21 MED ORDER — MIDAZOLAM HCL 2 MG/2ML IJ SOLN
1.0000 mg | INTRAMUSCULAR | Status: DC | PRN
  Administered 2012-12-21: 2 mg via INTRAVENOUS

## 2012-12-21 MED ORDER — FENTANYL CITRATE 0.05 MG/ML IJ SOLN
INTRAMUSCULAR | Status: AC
  Filled 2012-12-21: qty 5

## 2012-12-21 MED ORDER — LACTATED RINGERS IV SOLN
INTRAVENOUS | Status: DC
  Administered 2012-12-21: 07:00:00 via INTRAVENOUS

## 2012-12-21 MED ORDER — CEFAZOLIN SODIUM-DEXTROSE 2-3 GM-% IV SOLR
INTRAVENOUS | Status: AC
  Filled 2012-12-21: qty 50

## 2012-12-21 MED ORDER — ONDANSETRON HCL 4 MG/2ML IJ SOLN
4.0000 mg | Freq: Once | INTRAMUSCULAR | Status: AC | PRN
  Administered 2012-12-21: 4 mg via INTRAVENOUS

## 2012-12-21 MED ORDER — ONDANSETRON HCL 4 MG/2ML IJ SOLN
INTRAMUSCULAR | Status: AC
  Filled 2012-12-21: qty 2

## 2012-12-21 MED ORDER — SODIUM CHLORIDE 0.9 % IR SOLN
Status: DC | PRN
  Administered 2012-12-21: 1000 mL

## 2012-12-21 MED ORDER — ROCURONIUM BROMIDE 100 MG/10ML IV SOLN
INTRAVENOUS | Status: DC | PRN
  Administered 2012-12-21: 30 mg via INTRAVENOUS

## 2012-12-21 MED ORDER — GLYCOPYRROLATE 0.2 MG/ML IJ SOLN
INTRAMUSCULAR | Status: DC | PRN
  Administered 2012-12-21: .6 mg via INTRAVENOUS

## 2012-12-21 MED ORDER — BUPIVACAINE HCL (PF) 0.5 % IJ SOLN
INTRAMUSCULAR | Status: DC | PRN
  Administered 2012-12-21: 10 mL

## 2012-12-21 MED ORDER — CEFAZOLIN SODIUM-DEXTROSE 2-3 GM-% IV SOLR
2.0000 g | INTRAVENOUS | Status: AC
  Administered 2012-12-21: 2 g via INTRAVENOUS

## 2012-12-21 MED ORDER — BUPIVACAINE HCL (PF) 0.5 % IJ SOLN
INTRAMUSCULAR | Status: AC
  Filled 2012-12-21: qty 30

## 2012-12-21 SURGICAL SUPPLY — 39 items
APPLIER CLIP UNV 5X34 EPIX (ENDOMECHANICALS) ×2 IMPLANT
BAG HAMPER (MISCELLANEOUS) ×2 IMPLANT
BENZOIN TINCTURE PRP APPL 2/3 (GAUZE/BANDAGES/DRESSINGS) ×2 IMPLANT
CLOTH BEACON ORANGE TIMEOUT ST (SAFETY) ×2 IMPLANT
COVER LIGHT HANDLE STERIS (MISCELLANEOUS) ×4 IMPLANT
DECANTER SPIKE VIAL GLASS SM (MISCELLANEOUS) ×2 IMPLANT
DEVICE TROCAR PUNCTURE CLOSURE (ENDOMECHANICALS) ×2 IMPLANT
DURAPREP 26ML APPLICATOR (WOUND CARE) ×2 IMPLANT
ELECT REM PT RETURN 9FT ADLT (ELECTROSURGICAL) ×2
ELECTRODE REM PT RTRN 9FT ADLT (ELECTROSURGICAL) ×1 IMPLANT
FILTER SMOKE EVAC LAPAROSHD (FILTER) ×2 IMPLANT
FORMALIN 10 PREFIL 120ML (MISCELLANEOUS) ×2 IMPLANT
GLOVE BIOGEL PI IND STRL 7.0 (GLOVE) ×1 IMPLANT
GLOVE BIOGEL PI IND STRL 7.5 (GLOVE) ×1 IMPLANT
GLOVE BIOGEL PI INDICATOR 7.0 (GLOVE) ×1
GLOVE BIOGEL PI INDICATOR 7.5 (GLOVE) ×1
GLOVE ECLIPSE 7.0 STRL STRAW (GLOVE) ×4 IMPLANT
GLOVE EXAM NITRILE MD LF STRL (GLOVE) ×2 IMPLANT
GLOVE SS BIOGEL STRL SZ 6.5 (GLOVE) ×1 IMPLANT
GLOVE SUPERSENSE BIOGEL SZ 6.5 (GLOVE) ×1
GOWN STRL REIN XL XLG (GOWN DISPOSABLE) ×6 IMPLANT
HEMOSTAT SNOW SURGICEL 2X4 (HEMOSTASIS) ×2 IMPLANT
INST SET LAPROSCOPIC AP (KITS) ×2 IMPLANT
IV NS IRRIG 3000ML ARTHROMATIC (IV SOLUTION) IMPLANT
KIT ROOM TURNOVER APOR (KITS) ×2 IMPLANT
MANIFOLD NEPTUNE II (INSTRUMENTS) ×2 IMPLANT
NEEDLE INSUFFLATION 14GA 120MM (NEEDLE) ×2 IMPLANT
PACK LAP CHOLE LZT030E (CUSTOM PROCEDURE TRAY) ×2 IMPLANT
PAD ARMBOARD 7.5X6 YLW CONV (MISCELLANEOUS) ×2 IMPLANT
POUCH SPECIMEN RETRIEVAL 10MM (ENDOMECHANICALS) ×2 IMPLANT
SET BASIN LINEN APH (SET/KITS/TRAYS/PACK) ×2 IMPLANT
SET TUBE IRRIG SUCTION NO TIP (IRRIGATION / IRRIGATOR) IMPLANT
SLEEVE Z-THREAD 5X100MM (TROCAR) ×4 IMPLANT
STRIP CLOSURE SKIN 1/2X4 (GAUZE/BANDAGES/DRESSINGS) ×2 IMPLANT
SUT MNCRL AB 4-0 PS2 18 (SUTURE) ×4 IMPLANT
SUT VIC AB 2-0 CT2 27 (SUTURE) ×2 IMPLANT
TROCAR Z-THRD FIOS HNDL 11X100 (TROCAR) ×2 IMPLANT
TROCAR Z-THREAD FIOS 5X100MM (TROCAR) ×2 IMPLANT
WARMER LAPAROSCOPE (MISCELLANEOUS) ×2 IMPLANT

## 2012-12-21 NOTE — Transfer of Care (Signed)
Immediate Anesthesia Transfer of Care Note  Patient: Jaime Massey  Procedure(s) Performed: Procedure(s): LAPAROSCOPIC CHOLECYSTECTOMY (N/A)  Patient Location: PACU  Anesthesia Type:General  Level of Consciousness: awake, alert  and oriented  Airway & Oxygen Therapy: Patient Spontanous Breathing and Patient connected to face mask oxygen  Post-op Assessment: Report given to PACU RN  Post vital signs: Reviewed and stable  Complications: No apparent anesthesia complications

## 2012-12-21 NOTE — Interval H&P Note (Signed)
History and Physical Interval Note:  12/21/2012 7:44 AM  Jaime Massey  has presented today for surgery, with the diagnosis of cholelithiasis  The various methods of treatment have been discussed with the patient and family. After consideration of risks, benefits and other options for treatment, the patient has consented to  Procedure(s): LAPAROSCOPIC CHOLECYSTECTOMY (N/A) as a surgical intervention .  The patient's history has been reviewed, patient examined, no change in status, stable for surgery.  I have reviewed the patient's chart and labs.  Questions were answered to the patient's satisfaction.     Daniele Dillow C

## 2012-12-21 NOTE — Anesthesia Preprocedure Evaluation (Signed)
Anesthesia Evaluation  Patient identified by MRN, date of birth, ID band Patient awake    Reviewed: Allergy & Precautions, H&P , NPO status , Patient's Chart, lab work & pertinent test results  Airway Mallampati: II TM Distance: >3 FB Neck ROM: Full    Dental  (+) Teeth Intact   Pulmonary Current Smoker,  breath sounds clear to auscultation        Cardiovascular DVT Rhythm:Regular Rate:Normal     Neuro/Psych Seizures - (none since age 37), Well Controlled,     GI/Hepatic   Endo/Other    Renal/GU      Musculoskeletal   Abdominal   Peds  Hematology   Anesthesia Other Findings   Reproductive/Obstetrics                           Anesthesia Physical Anesthesia Plan  ASA: II  Anesthesia Plan: General   Post-op Pain Management:    Induction: Intravenous  Airway Management Planned: Oral ETT  Additional Equipment:   Intra-op Plan:   Post-operative Plan: Extubation in OR  Informed Consent: I have reviewed the patients History and Physical, chart, labs and discussed the procedure including the risks, benefits and alternatives for the proposed anesthesia with the patient or authorized representative who has indicated his/her understanding and acceptance.     Plan Discussed with:   Anesthesia Plan Comments:         Anesthesia Quick Evaluation

## 2012-12-21 NOTE — H&P (Signed)
  NTS SOAP Note  Vital Signs:  Vitals as of: 12/10/2012: Systolic 106: Diastolic 70: Heart Rate 125: Temp 100.79F: Height 69ft 9in: Weight 193Lbs 0 Ounces: Pain Level 6: BMI 28.5  BMI : 28.5 kg/m2  Subjective: This 37 Years 15 Months old Female presents for of 7-8 months of RUQ pain.  Initially thought to be reflux.  Located RUQ.  Radiation to back and shoulder.  Associated nausea, infrequent non bloody emesis.  Increase with fatty foods.  Occ diarrhea.  No melena, no hematochezia.  No jaundice.  Colicky in nature.  + family history of biliary disease.  ? Native American ancestry.  Episodes have become more frequent.    Review of Symptoms:  Constitutional:unremarkable   Head:unremarkable    Eyes:unremarkable   Nose/Mouth/Throat:unremarkable Cardiovascular:  unremarkable   Respiratory:unremarkable       as per HPI Genitourinary:unremarkable       arthralgia neck joints.   Skin:unremarkable Breast:unremarkable   Hematolgic/Lymphatic:unremarkable     Allergic/Immunologic:unremarkable     Past Medical History:  Obtained     Past Medical History       GERD, hx of PE during prenancy  Social History:Obtained  Social History  Preferred Language: English Race:  White Ethnicity: Not Hispanic / Latino Age: 37 Years 3 Months Marital Status:  S   Smoking Status: Unknown if ever smoked Functional Status reviewed on mm/dd/yyyy ------------------------------------------------ Bathing: Normal Cooking: Normal Dressing: Normal Driving: Normal Eating: Normal Managing Meds: Normal Oral Care: Normal Shopping: Normal Toileting: Normal Transferring: Normal Walking: Normal Cognitive Status reviewed on mm/dd/yyyy ------------------------------------------------ Attention: Normal Decision Making: Normal Language: Normal Memory: Normal Motor: Normal Perception: Normal Problem Solving: Normal Visual and Spatial: Normal   Family  History:Obtained     Family Health History      None recorded.    Objective Information: General:  Well appearing, well nourished in no distress.  obese Skin:     no rash or prominent lesions Head:Atraumatic; no masses; no abnormalities Eyes:  conjunctiva clear, EOM intact, PERRL Mouth:  Mucous membranes moist, no mucosal lesions. Neck:  Supple without lymphadenopathy.  Heart:  RRR, no murmur Lungs:    CTA bilaterally, no wheezes, rhonchi, rales.  Breathing unlabored. Abdomen:Soft, ND, no HSM, no masses.  Mild RUQ tenderness.  No peritoneal signs.   Extremities:  No deformities, clubbing, cyanosis, or edema.      RUQ U/S:  + Stones, mild wall thickening. Assessment:    Plan: Cholelithiasis, Options discussed with the patient.  Will plan to proceed to the OR at her convenience.  Patient to avoid fatty foods in the interim.  Patient will call with issues.  Patient Education:Alternative treatments to surgery were discussed with patient (and family).  Risks and benefits  of procedure were fully explained to the patient (and family) who gave informed consent. Patient/family questions were addressed.  Follow-up:Pending Surgery

## 2012-12-21 NOTE — Op Note (Signed)
Patient:  Jaime Massey  DOB:  03-04-1976  MRN:  [id number]   Preop Diagnosis:  Cholelithiasis  Postop Diagnosis:  The same  Procedure:  Laparoscopic cholecystectomy  Surgeon:  Dr. Tilford Pillar  Anes:  General endotracheal, 0.5% Sensorcaine plain for local  Indications:  Patient is a 37 year old female presented my office with a history of epigastric and right upper quadrant abdominal pain. Workup and evaluation was consistent for cholelithiasis.  Risks benefits alternatives a laparoscopic possible open cholecystectomy were discussed at length the patient including but not limited to risk of bleeding, infection, bile leak, small bowel injury, common bile duct injury, intraoperative cardiac and pulmonary events. Patient's questions and concerns are addressed the patient as consented for the planned procedure.  Procedure note:  Patient is taken to the operating room was placed in a supine position on the operating table time the general anesthetic is administered. Once patient was asleep she symmetrically intubated by the nurse anesthetist. At this point her abdomen is prepped with DuraPrep solution and draped in standard fashion. Time out was performed. At this point a stab incision was created infraumbilically with 11 blade scalpel with additional dissection down to subcuticular tissue carried out using a Coker clamp. At this point the fascia is grasped with the Coker clamp and elevated anteriorly. A Veress needle is inserted and saline drop test is utilized confirm intraperitoneal placement. At this time pneumoperitoneum was initiated and once sufficient pneumoperitoneum was obtained an 11 mm insert overlap scope allowing visualization the trocar entering his peritoneal cavity. At this point the inner cannulas removed lap scope was reinserted there is no evidence a trocar peritoneal placement injury. Patient's placed into a reverse Trendelenburg left lateral decubitus position. The remaining  trochars replaced a 5 mm can epigastrium, 5 monitored in the midline, and a 5 mm in the right lateral abdominal wall. The fundus of the gallbladder is grasped and lifted up and over the right lobe the liver. The peritoneal reflection onto the infundibulum was bluntly stripped using a [place] exposing the cystic duct and cystic arteries he entered into the infundibulum. A window was created by both the structures. 3 endoclips placed approximately one distally and the cystic ducts divided between 2 most distal clips. Similarly the cystic artery is ligated with 2 endoclips proximally one distally and the cystic arteries divided between 2 most is a clips. At this point which cauteries utilized dissect the gallbladder free from the gallbladder fossa. Once gallbladder is free is placed in Endo Catch bag and placed up and over the right lobe the liver. Inspection the gallbladder fossa indicate excellent hemostasis. Endoclips were inspected there is no evidence of any bleeding or bile leak. At this time her my attention to closure.  Using an Endo Close suture passing device a 2 Vicryl sutures passed to the umbilical trocar site. With this suture and placed the gallbladder is retrieved was read the umbilical trocar site and intact Endo Catch bag. The gallbladder is placed in the back table and sent as a perm specimen to pathology. At this time the pneumoperitoneum was evacuated. Trochars removed. The Vicryl sutures secured. Local anesthetic is instilled. The skin edges at all 4 trocar sites reapproximated using a 4 Monocryl in a running subcuticular suture. The skin was washed dried moist dry towel. Benzoin is applied around incision. Half-inch are suture placed. The drapes removed patient left come out of general anesthetic. She stretcher the PACU in stable condition. At the conclusion of procedure all instrument,  sponge, needle counts are correct. Patient tolerated procedure extremely well  Complications:   None apparent  EBL:  Minimal  Specimen:  Gallbladder

## 2012-12-21 NOTE — Anesthesia Postprocedure Evaluation (Signed)
  Anesthesia Post-op Note  Patient: Jaime Massey  Procedure(s) Performed: Procedure(s): LAPAROSCOPIC CHOLECYSTECTOMY (N/A)  Patient Location: PACU  Anesthesia Type:General  Level of Consciousness: awake, alert  and oriented  Airway and Oxygen Therapy: Patient Spontanous Breathing and Patient connected to face mask oxygen  Post-op Pain: moderate  Post-op Assessment: Post-op Vital signs reviewed, Patient's Cardiovascular Status Stable, Respiratory Function Stable, Patent Airway and No signs of Nausea or vomiting  Post-op Vital Signs: Reviewed and stable  Complications: No apparent anesthesia complications

## 2012-12-22 ENCOUNTER — Encounter (HOSPITAL_COMMUNITY): Payer: Self-pay | Admitting: General Surgery

## 2014-01-04 ENCOUNTER — Emergency Department (HOSPITAL_COMMUNITY): Payer: PRIVATE HEALTH INSURANCE

## 2014-01-04 ENCOUNTER — Emergency Department (HOSPITAL_COMMUNITY)
Admission: EM | Admit: 2014-01-04 | Discharge: 2014-01-04 | Disposition: A | Discharge status: Home / Self Care | Payer: PRIVATE HEALTH INSURANCE | Attending: Emergency Medicine | Admitting: Emergency Medicine

## 2014-01-04 ENCOUNTER — Encounter (HOSPITAL_COMMUNITY): Payer: Self-pay | Admitting: Emergency Medicine

## 2014-01-04 DIAGNOSIS — S60229A Contusion of unspecified hand, initial encounter: Secondary | ICD-10-CM | POA: Insufficient documentation

## 2014-01-04 DIAGNOSIS — Y9289 Other specified places as the place of occurrence of the external cause: Secondary | ICD-10-CM | POA: Insufficient documentation

## 2014-01-04 DIAGNOSIS — F17200 Nicotine dependence, unspecified, uncomplicated: Secondary | ICD-10-CM | POA: Insufficient documentation

## 2014-01-04 DIAGNOSIS — Z86711 Personal history of pulmonary embolism: Secondary | ICD-10-CM | POA: Insufficient documentation

## 2014-01-04 DIAGNOSIS — S60221A Contusion of right hand, initial encounter: Secondary | ICD-10-CM

## 2014-01-04 DIAGNOSIS — X500XXA Overexertion from strenuous movement or load, initial encounter: Secondary | ICD-10-CM | POA: Insufficient documentation

## 2014-01-04 DIAGNOSIS — S63501A Unspecified sprain of right wrist, initial encounter: Secondary | ICD-10-CM

## 2014-01-04 DIAGNOSIS — Y9389 Activity, other specified: Secondary | ICD-10-CM | POA: Insufficient documentation

## 2014-01-04 DIAGNOSIS — Z8739 Personal history of other diseases of the musculoskeletal system and connective tissue: Secondary | ICD-10-CM | POA: Insufficient documentation

## 2014-01-04 DIAGNOSIS — R569 Unspecified convulsions: Secondary | ICD-10-CM | POA: Insufficient documentation

## 2014-01-04 DIAGNOSIS — Y990 Civilian activity done for income or pay: Secondary | ICD-10-CM | POA: Insufficient documentation

## 2014-01-04 DIAGNOSIS — S63509A Unspecified sprain of unspecified wrist, initial encounter: Secondary | ICD-10-CM | POA: Insufficient documentation

## 2014-01-04 DIAGNOSIS — Z86718 Personal history of other venous thrombosis and embolism: Secondary | ICD-10-CM | POA: Insufficient documentation

## 2014-01-04 MED ORDER — HYDROCODONE-ACETAMINOPHEN 5-325 MG PO TABS: 1.0000 | ORAL_TABLET | ORAL | Status: DC | PRN

## 2014-01-04 MED ORDER — MELOXICAM 15 MG PO TABS: 15.0000 mg | ORAL_TABLET | Freq: Every day | ORAL | Status: DC

## 2014-01-04 NOTE — ED Provider Notes (Signed)
CSN: [id number]     Arrival date & time 01/04/14  1025 History   First MD Initiated Contact with Patient 01/04/14 1037     Chief Complaint  Patient presents with  . Hand Injury     (Consider location/radiation/quality/duration/timing/severity/associated sxs/prior Treatment) HPI Comments: Patient is a 38 year old female who is in employee at the [employer]. The patient presents to the emergency department with complaint of injury to the right thumb. 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 The patient states she had almost immediate pain to the thumb and wrist area. She noted mild swelling and some bruising. No other injury reported . The patient is not on any anticoagulation medications, she has not had any previous operations or procedures involving the upper extremities.  Patient is a 38 y.o. female presenting with hand injury. The history is provided by the patient.  Hand Injury Associated symptoms: no back pain and no neck pain     Past Medical History  Diagnosis Date  . DVT (deep venous thrombosis)   . PE (pulmonary embolism)   . Seizures     as child from severe ear infections- no meds now and no seizures since age 47  . Arthritis    Past Surgical History  Procedure Laterality Date  . Cesarean section    . Cholecystectomy N/A 12/21/2012    Procedure: LAPAROSCOPIC CHOLECYSTECTOMY;  Surgeon: Donato Heinz, MD;  Location: [facility];  Service: General;  Laterality: N/A;   History reviewed. No pertinent family history. History  Substance Use Topics  . Smoking status: Current Every Day Smoker -- 0.50 packs/day for 20 years    Types: Cigarettes  . Smokeless tobacco: Not on file  . Alcohol Use: No   OB History   Grav Para Term Preterm Abortions TAB SAB Ect Mult Living                 Review of Systems  Constitutional: Negative for activity change.       All ROS Neg except as noted  in HPI  HENT: Negative for nosebleeds.   Eyes: Negative for photophobia and discharge.  Respiratory: Negative for cough, shortness of breath and wheezing.   Cardiovascular: Negative for chest pain and palpitations.  Gastrointestinal: Negative for abdominal pain and blood in stool.  Genitourinary: Negative for dysuria, frequency and hematuria.  Musculoskeletal: Positive for arthralgias. Negative for back pain and neck pain.  Skin: Negative.   Neurological: Positive for seizures. Negative for dizziness and speech difficulty.  Psychiatric/Behavioral: Negative for hallucinations and confusion.      Allergies  Review of patient's allergies indicates no known allergies.  Home Medications   Prior to Admission medications   Not on File   BP 110/76  Pulse 77  Temp(Src) 98.7 F (37.1 C) (Oral)  Resp 18  Ht 5' 9"  (1.753 m)  Wt 195 lb (88.451 kg)  BMI 28.78 kg/m2  SpO2 100% Physical Exam  Nursing note and vitals reviewed. Constitutional: She is oriented to person, place, and time. She appears well-developed and well-nourished.  Non-toxic appearance.  HENT:  Head: Normocephalic.  Right Ear: Tympanic membrane and external ear normal.  Left Ear: Tympanic membrane and external ear normal.  Eyes: EOM and lids are normal. Pupils are equal, round, and reactive to light.  Neck: Normal range of motion. Neck supple. Carotid bruit is not present.  Cardiovascular: Normal rate, regular rhythm, normal  heart sounds, intact distal pulses and normal pulses.   Pulmonary/Chest: Breath sounds normal. No respiratory distress.  Abdominal: Soft. Bowel sounds are normal. There is no tenderness. There is no guarding.  Musculoskeletal: Normal range of motion.  There is full range of motion of the right and left shoulders and elbows. There is soreness with range of motion of the right wrist. There is no pain in the anatomical snuff box. There is pain of the right thumb, particularly at the MP joint. The  capillary refill is less than 2 seconds. The radial pulse is 2+ bilaterally. There is a bruise to the greater thenar eminence on the right.  Lymphadenopathy:       Head (right side): No submandibular adenopathy present.       Head (left side): No submandibular adenopathy present.    She has no cervical adenopathy.  Neurological: She is alert and oriented to person, place, and time. She has normal strength. No cranial nerve deficit or sensory deficit.  Skin: Skin is warm and dry.  Psychiatric: She has a normal mood and affect. Her speech is normal.    ED Course  Procedures (including critical care time) Labs Review Labs Reviewed - No data to display  Imaging Review Dg Hand Complete Right  01/04/2014   CLINICAL DATA:  HAND INJURY  EXAM: RIGHT HAND - COMPLETE 3+ VIEW  COMPARISON:  None.  FINDINGS: There is no evidence of fracture or dislocation. There is no evidence of arthropathy or other focal bone abnormality. Soft tissues are unremarkable.  IMPRESSION: Negative.   Electronically Signed   By: Margaree Mackintosh M.D.   On: 01/04/2014 11:05     EKG Interpretation None      MDM X-ray of the right hand is negative for fracture or dislocation. Vital signs are well within normal limits. The pulse oximetry is 100% on room air.  The patient is fitted with a thumb spica splint and sling. The patient is excused for work duty over the next 5 days. Prescription for Mobic and Norco given to the patient. The patient will follow with orthopedics if not improving.    Final diagnoses:  None    **I have reviewed nursing notes, vital signs, and all appropriate lab and imaging results for this patient.  Lenox Ahr, PA-C 01/05/14 2218

## 2014-01-04 NOTE — Discharge Instructions (Signed)
Your xray is negative for fracture or dislocation. Please apply ice, and elevate the hand as much as possible. Please use mobic daily with food. Please use norco for pain if needed. This medication may cause drowsiness, use with caution. Please see Dr Aline Brochure for additional evaluation if not improving. Wrist Pain A wrist sprain happens when the bands of tissue that hold the wrist joints together (ligament) stretch too much or tear. A wrist strain happens when muscles or bands of tissue that connect muscles to bones (tendons) are stretched or pulled. HOME CARE  Put ice on the injured area.  Put ice in a plastic bag.  Place a towel between your skin and the bag.  Leave the ice on for 15-20 minutes, 03-04 times a day, for the first 2 days.  Raise (elevate) the injured wrist to lessen puffiness (swelling).  Rest the injured wrist for at least 48 hours or as told by your doctor.  Wear a splint, cast, or an elastic wrap as told by your doctor.  Only take medicine as told by your doctor.  Follow up with your doctor as told. This is important. GET HELP RIGHT AWAY IF:   The fingers are puffy, very red, white, or cold and blue.  The fingers lose feeling (numb) or tingle.  The pain gets worse.  It is hard to move the fingers. MAKE SURE YOU:   Understand these instructions.  Will watch your condition.  Will get help right away if you are not doing well or get worse. Document Released: 12/11/2007 Document Revised: 09/16/2011 Document Reviewed: 08/15/2010 [facility] Patient Information 2015 [place], [place]. This information is not intended to replace advice given to you by your health care provider. Make sure you discuss any questions you have with your health care provider.

## 2014-01-04 NOTE — ED Notes (Signed)
Pt works at [employer], sent over by supervisor after a resident bent the pt's rt thumb backwards, pt co pain and swelling to lt hand.

## 2014-01-06 NOTE — ED Provider Notes (Signed)
Medical screening examination/treatment/procedure(s) were performed by non-physician practitioner and as supervising physician I was immediately available for consultation/collaboration.   EKG Interpretation None        Sharyon Cable, MD 01/06/14 [phone]

## 2014-06-25 ENCOUNTER — Emergency Department (HOSPITAL_COMMUNITY)
Admission: EM | Admit: 2014-06-25 | Discharge: 2014-06-25 | Disposition: A | Discharge status: Home / Self Care | Payer: Medicaid Other | Attending: Emergency Medicine | Admitting: Emergency Medicine

## 2014-06-25 ENCOUNTER — Encounter (HOSPITAL_COMMUNITY): Payer: Self-pay | Admitting: *Deleted

## 2014-06-25 DIAGNOSIS — T360X5A Adverse effect of penicillins, initial encounter: Secondary | ICD-10-CM | POA: Insufficient documentation

## 2014-06-25 DIAGNOSIS — M1990 Unspecified osteoarthritis, unspecified site: Secondary | ICD-10-CM | POA: Diagnosis not present

## 2014-06-25 DIAGNOSIS — Z791 Long term (current) use of non-steroidal anti-inflammatories (NSAID): Secondary | ICD-10-CM | POA: Insufficient documentation

## 2014-06-25 DIAGNOSIS — Z792 Long term (current) use of antibiotics: Secondary | ICD-10-CM | POA: Insufficient documentation

## 2014-06-25 DIAGNOSIS — Z720 Tobacco use: Secondary | ICD-10-CM | POA: Diagnosis not present

## 2014-06-25 DIAGNOSIS — Z86718 Personal history of other venous thrombosis and embolism: Secondary | ICD-10-CM | POA: Diagnosis not present

## 2014-06-25 DIAGNOSIS — Z86711 Personal history of pulmonary embolism: Secondary | ICD-10-CM | POA: Diagnosis not present

## 2014-06-25 DIAGNOSIS — R21 Rash and other nonspecific skin eruption: Secondary | ICD-10-CM | POA: Insufficient documentation

## 2014-06-25 DIAGNOSIS — T7840XA Allergy, unspecified, initial encounter: Secondary | ICD-10-CM

## 2014-06-25 DIAGNOSIS — Z8669 Personal history of other diseases of the nervous system and sense organs: Secondary | ICD-10-CM | POA: Insufficient documentation

## 2014-06-25 MED ORDER — FAMOTIDINE 20 MG PO TABS
40.0000 mg | ORAL_TABLET | Freq: Once | ORAL | Status: AC
  Administered 2014-06-25: 40 mg via ORAL
  Filled 2014-06-25: qty 2

## 2014-06-25 MED ORDER — PREDNISONE 50 MG PO TABS
60.0000 mg | ORAL_TABLET | Freq: Once | ORAL | Status: AC
  Administered 2014-06-25: 60 mg via ORAL
  Filled 2014-06-25 (×2): qty 1

## 2014-06-25 MED ORDER — PREDNISONE 20 MG PO TABS: 60.0000 mg | ORAL_TABLET | Freq: Every day | ORAL | Status: DC

## 2014-06-25 MED ORDER — DIPHENHYDRAMINE HCL 25 MG PO CAPS
50.0000 mg | ORAL_CAPSULE | Freq: Once | ORAL | Status: AC
  Administered 2014-06-25: 50 mg via ORAL
  Filled 2014-06-25: qty 2

## 2014-06-25 NOTE — ED Provider Notes (Signed)
TIME SEEN: 11:00 PM  CHIEF COMPLAINT: Rash  HPI: Patient is a 38 y.o. F with prior history of DVT and pulmonary embolus no longer on anticoagulation who presents emergency department with a diffuse pruritic rash. States that she was recently started on amoxicillin for sinusitis. States she woke today with a diffuse pruritic rash. No other new exposures. No angioedema, shortness of breath or wheezing. No hypotension.  ROS: See HPI Constitutional: no fever  Eyes: no drainage  ENT: no runny nose   Cardiovascular:  no chest pain  Resp: no SOB  GI: no vomiting GU: no dysuria Integumentary: no rash  Allergy: no hives  Musculoskeletal: no leg swelling  Neurological: no slurred speech ROS otherwise negative  PAST MEDICAL HISTORY/PAST SURGICAL HISTORY:  Past Medical History  Diagnosis Date  . DVT (deep venous thrombosis)   . PE (pulmonary embolism)   . Seizures     as child from severe ear infections- no meds now and no seizures since age 25  . Arthritis     MEDICATIONS:  Prior to Admission medications   Medication Sig Start Date End Date Taking? Authorizing Provider  amoxicillin-clavulanate (AUGMENTIN) 875-125 MG per tablet Take 1 tablet by mouth 2 (two) times daily.   Yes Historical Provider, MD  diphenhydrAMINE (BENADRYL) 25 MG tablet Take 25-50 mg by mouth every 6 (six) hours as needed for itching or allergies.   Yes Historical Provider, MD  HYDROcodone-acetaminophen (NORCO/VICODIN) 5-325 MG per tablet Take 1 tablet by mouth every 4 (four) hours as needed. Patient not taking: Reported on 06/25/2014 01/04/14   Lenox Ahr, PA-C  meloxicam (MOBIC) 15 MG tablet Take 1 tablet (15 mg total) by mouth daily. Patient not taking: Reported on 06/25/2014 01/04/14   Lenox Ahr, PA-C    ALLERGIES:  No Known Allergies  SOCIAL HISTORY:  History  Substance Use Topics  . Smoking status: Current Every Day Smoker -- 0.50 packs/day for 20 years    Types: Cigarettes  . Smokeless tobacco:  Not on file  . Alcohol Use: No    FAMILY HISTORY: History reviewed. No pertinent family history.  EXAM: BP 117/73 mmHg  Pulse 96  Temp(Src) 97.7 F (36.5 C)  Resp 20  Ht 5' 9"  (1.753 m)  Wt 194 lb (87.998 kg)  BMI 28.64 kg/m2  SpO2 100% CONSTITUTIONAL: Alert and oriented and responds appropriately to questions. Well-appearing; well-nourished HEAD: Normocephalic EYES: Conjunctivae clear, PERRL ENT: normal nose; no rhinorrhea; moist mucous membranes; pharynx without lesions noted, no angioedema, no trismus or drooling, no stridor NECK: Supple, no meningismus, no LAD  CARD: RRR; S1 and S2 appreciated; no murmurs, no clicks, no rubs, no gallops RESP: Normal chest excursion without splinting or tachypnea; breath sounds clear and equal bilaterally; no wheezes, no rhonchi, no rales, no hypoxia or respiratory distress ABD/GI: Normal bowel sounds; non-distended; soft, non-tender, no rebound, no guarding BACK:  The back appears normal and is non-tender to palpation, there is no CVA tenderness EXT: Normal ROM in all joints; non-tender to palpation; no edema; normal capillary refill; no cyanosis    SKIN: Normal color for age and race; warm; diffuse erythematous macular rash with some areas that appear to be urticaria, no testicular lesions or blisters, no rash in her palms or soles NEURO: Moves all extremities equally PSYCH: The patient's mood and manner are appropriate. Grooming and personal hygiene are appropriate.  MEDICAL DECISION MAKING: Patient here with drug rash versus urticaria. Will treat with steroid burst. We'll have her continue Benadryl.  She is very well-appearing on exam. I do not feel she needs a further monitored in the ED since symptoms have been present all day and have not significantly progressed. I do not feel she needs epinephrine. She is hemodynamically stable. Discussed with patient I recommend stopping amoxicillin analysis and this is one of her allergies. Discussed with  patient that I do not feel she needs any other antibiotics for sinusitis as this is likely caused by a viral infection. Have recommended PCP follow-up if symptoms continue or worsen. Discussed return precautions. She verbalizes understanding and is comfortable with plan.       [place], DO 06/25/14 2339

## 2014-06-25 NOTE — ED Notes (Signed)
Pt c/o red rash all over body; pt states she has been taking amoxicillin x 4 days for a sinus infection; pt states she woke up this am with the rash and itching all over

## 2014-06-25 NOTE — Discharge Instructions (Signed)
Hives Hives are itchy, red, swollen areas of the skin. They can vary in size and location on your body. Hives can come and go for hours or several days (acute hives) or for several weeks (chronic hives). Hives do not spread from person to person (noncontagious). They may get worse with scratching, exercise, and emotional stress. CAUSES   Allergic reaction to food, additives, or drugs.  Infections, including the common cold.  Illness, such as vasculitis, lupus, or thyroid disease.  Exposure to sunlight, heat, or cold.  Exercise.  Stress.  Contact with chemicals. SYMPTOMS   Red or white swollen patches on the skin. The patches may change size, shape, and location quickly and repeatedly.  Itching.  Swelling of the hands, feet, and face. This may occur if hives develop deeper in the skin. DIAGNOSIS  Your caregiver can usually tell what is wrong by performing a physical exam. Skin or blood tests may also be done to determine the cause of your hives. In some cases, the cause cannot be determined. TREATMENT  Mild cases usually get better with medicines such as antihistamines. Severe cases may require an emergency epinephrine injection. If the cause of your hives is known, treatment includes avoiding that trigger.  HOME CARE INSTRUCTIONS   Avoid causes that trigger your hives.  Take antihistamines as directed by your caregiver to reduce the severity of your hives. Non-sedating or low-sedating antihistamines are usually recommended. Do not drive while taking an antihistamine.  Take any other medicines prescribed for itching as directed by your caregiver.  Wear loose-fitting clothing.  Keep all follow-up appointments as directed by your caregiver. SEEK MEDICAL CARE IF:   You have persistent or severe itching that is not relieved with medicine.  You have painful or swollen joints. SEEK IMMEDIATE MEDICAL CARE IF:   You have a fever.  Your tongue or lips are swollen.  You have  trouble breathing or swallowing.  You feel tightness in the throat or chest.  You have abdominal pain. These problems may be the first sign of a life-threatening allergic reaction. Call your local emergency services (911 in [place] MAKE SURE YOU:   Understand these instructions.  Will watch your condition.  Will get help right away if you are not doing well or get worse. Document Released: 06/24/2005 Document Revised: 06/29/2013 Document Reviewed: 09/17/2011 [employer] Patient Information 2015 [employer]. This information is not intended to replace advice given to you by your health care provider. Make sure you discuss any questions you have with your health care provider.  

## 2014-07-17 ENCOUNTER — Emergency Department (HOSPITAL_COMMUNITY)
Admission: EM | Admit: 2014-07-17 | Discharge: 2014-07-17 | Disposition: A | Discharge status: Home / Self Care | Payer: PRIVATE HEALTH INSURANCE | Attending: Emergency Medicine | Admitting: Emergency Medicine

## 2014-07-17 ENCOUNTER — Encounter (HOSPITAL_COMMUNITY): Payer: Self-pay | Admitting: Emergency Medicine

## 2014-07-17 ENCOUNTER — Emergency Department (HOSPITAL_COMMUNITY): Payer: PRIVATE HEALTH INSURANCE

## 2014-07-17 DIAGNOSIS — Z791 Long term (current) use of non-steroidal anti-inflammatories (NSAID): Secondary | ICD-10-CM | POA: Diagnosis not present

## 2014-07-17 DIAGNOSIS — Z7952 Long term (current) use of systemic steroids: Secondary | ICD-10-CM | POA: Insufficient documentation

## 2014-07-17 DIAGNOSIS — Z792 Long term (current) use of antibiotics: Secondary | ICD-10-CM | POA: Insufficient documentation

## 2014-07-17 DIAGNOSIS — Y9389 Activity, other specified: Secondary | ICD-10-CM | POA: Diagnosis not present

## 2014-07-17 DIAGNOSIS — M1990 Unspecified osteoarthritis, unspecified site: Secondary | ICD-10-CM | POA: Diagnosis not present

## 2014-07-17 DIAGNOSIS — S63501A Unspecified sprain of right wrist, initial encounter: Secondary | ICD-10-CM | POA: Diagnosis not present

## 2014-07-17 DIAGNOSIS — Y990 Civilian activity done for income or pay: Secondary | ICD-10-CM | POA: Diagnosis not present

## 2014-07-17 DIAGNOSIS — Z86711 Personal history of pulmonary embolism: Secondary | ICD-10-CM | POA: Insufficient documentation

## 2014-07-17 DIAGNOSIS — Z8669 Personal history of other diseases of the nervous system and sense organs: Secondary | ICD-10-CM | POA: Diagnosis not present

## 2014-07-17 DIAGNOSIS — X58XXXA Exposure to other specified factors, initial encounter: Secondary | ICD-10-CM | POA: Diagnosis not present

## 2014-07-17 DIAGNOSIS — Z720 Tobacco use: Secondary | ICD-10-CM | POA: Insufficient documentation

## 2014-07-17 DIAGNOSIS — Z86718 Personal history of other venous thrombosis and embolism: Secondary | ICD-10-CM | POA: Insufficient documentation

## 2014-07-17 DIAGNOSIS — Y9289 Other specified places as the place of occurrence of the external cause: Secondary | ICD-10-CM | POA: Diagnosis not present

## 2014-07-17 DIAGNOSIS — S6391XA Sprain of unspecified part of right wrist and hand, initial encounter: Secondary | ICD-10-CM

## 2014-07-17 DIAGNOSIS — M79643 Pain in unspecified hand: Secondary | ICD-10-CM

## 2014-07-17 DIAGNOSIS — S6992XA Unspecified injury of left wrist, hand and finger(s), initial encounter: Secondary | ICD-10-CM | POA: Diagnosis present

## 2014-07-17 MED ORDER — HYDROCODONE-ACETAMINOPHEN 5-325 MG PO TABS: 1.0000 | ORAL_TABLET | ORAL | Status: DC | PRN

## 2014-07-17 MED ORDER — NAPROXEN 500 MG PO TABS: 500.0000 mg | ORAL_TABLET | Freq: Two times a day (BID) | ORAL | Status: DC

## 2014-07-17 MED ORDER — NAPROXEN 500 MG PO TABS
500.0000 mg | ORAL_TABLET | Freq: Two times a day (BID) | ORAL | Status: DC
Start: 2014-07-17 — End: 2015-02-04

## 2014-07-17 NOTE — Discharge Instructions (Signed)
Wrist Pain Wrist injuries are frequent in adults and children. A sprain is an injury to the ligaments that hold your bones together. A strain is an injury to muscle or muscle cord-like structures (tendons) from stretching or pulling. Generally, when wrists are moderately tender to touch following a fall or injury, a break in the bone (fracture) may be present. Most wrist sprains or strains are better in 3 to 5 days, but complete healing may take several weeks. HOME CARE INSTRUCTIONS   Put ice on the injured area.  Put ice in a plastic bag.  Place a towel between your skin and the bag.  Leave the ice on for 15-20 minutes, 3-4 times a day, for the first 2 days, or as directed by your health care provider.  Keep your arm raised above the level of your heart whenever possible to reduce swelling and pain.  Rest the injured area for at least 48 hours or as directed by your health care provider.  If a splint or elastic bandage has been applied, use it for as long as directed by your health care provider or until seen by a health care provider for a follow-up exam.  Only take over-the-counter or prescription medicines for pain, discomfort, or fever as directed by your health care provider.  Keep all follow-up appointments. You may need to follow up with a specialist or have follow-up X-rays. Improvement in pain level is not a guarantee that you did not fracture a bone in your wrist. The only way to determine whether or not you have a broken bone is by X-ray. SEEK IMMEDIATE MEDICAL CARE IF:   Your fingers are swollen, very red, white, or cold and blue.  Your fingers are numb or tingling.  You have increasing pain.  You have difficulty moving your fingers. MAKE SURE YOU:   Understand these instructions.  Will watch your condition.  Will get help right away if you are not doing well or get worse. Document Released: 04/03/2005 Document Revised: 06/29/2013 Document Reviewed:  08/15/2010 [facility] Patient Information 2015 [place], [place]. This information is not intended to replace advice given to you by your health care provider. Make sure you discuss any questions you have with your health care provider.  Sprain A sprain happens when the bands of tissue that connect bones and hold joints together (ligaments) stretch too much or tear. HOME CARE  Raise (elevate) the injured area to lessen puffiness (swelling).  Put ice on the injured area 2 times a day for 2-3 days.  Put ice in a plastic bag.  Place a towel between your skin and the bag.  Leave the ice on for 15 minutes.  Only take medicine as told by your doctor.  Protect your injured area until your pain and stiffness go away.  Do not get your cast or splint wet. Cover your cast or splint with a plastic bag when you shower or take a bath. Do not swim in a pool.  Your doctor may suggest exercises during your recovery to keep from getting stiff. GET HELP RIGHT AWAY IF:   Your cast or splint becomes damaged.  Your pain gets worse. MAKE SURE YOU:   Understand these instructions.  Will watch this condition.  Will get help right away if you are not doing well or get worse. Document Released: 12/11/2007 Document Revised: 04/14/2013 Document Reviewed: 07/06/2011 [facility] Patient Information 2015 [place], [place]. This information is not intended to replace advice given to you by your health care  provider. Make sure you discuss any questions you have with your health care provider.   You may take the hydrocodone prescribed for pain relief.  This will make you drowsy - do not drive within 4 hours of taking this medication.  Apply an ice pack for 10 minute episodes throughout the day for the next 2 days.  Elevation will also help with pain and swelling.  You may add a heating pad for 20 minutes several times daily starting on Tuesday.  Wear the splint for comfort and support as your injury heals.  You should  be rechecked by occupational medicine in 5 days to determine if you can go back to full activity with your hand and wrist at that time.
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
## 2014-08-16 ENCOUNTER — Other Ambulatory Visit (HOSPITAL_COMMUNITY): Payer: Self-pay | Admitting: Orthopedic Surgery

## 2014-08-16 DIAGNOSIS — S60221A Contusion of right hand, initial encounter: Secondary | ICD-10-CM

## 2014-08-26 ENCOUNTER — Ambulatory Visit (HOSPITAL_COMMUNITY)
Admission: RE | Admit: 2014-08-26 | Discharge: 2014-08-26 | Disposition: A | Discharge status: Home / Self Care | Payer: PRIVATE HEALTH INSURANCE | Source: Ambulatory Visit | Attending: Orthopedic Surgery | Admitting: Orthopedic Surgery

## 2014-08-26 DIAGNOSIS — M7989 Other specified soft tissue disorders: Secondary | ICD-10-CM | POA: Diagnosis present

## 2014-08-26 DIAGNOSIS — S60221A Contusion of right hand, initial encounter: Secondary | ICD-10-CM

## 2014-09-23 ENCOUNTER — Ambulatory Visit: Payer: PRIVATE HEALTH INSURANCE | Attending: Orthopedic Surgery | Admitting: Physical Therapy

## 2014-09-23 ENCOUNTER — Encounter: Payer: Self-pay | Admitting: Physical Therapy

## 2014-09-23 DIAGNOSIS — M79644 Pain in right finger(s): Secondary | ICD-10-CM | POA: Insufficient documentation

## 2014-09-23 NOTE — Therapy (Signed)
[facility] [place], [place], [address] Phone: [phone]   Fax:  [phone]  Physical Therapy Evaluation  Patient Details  Name: Jaime Massey MRN: [id number] Date of Birth: [birth year] Referring Provider:  Latanya Maudlin, MD  Encounter Date: 09/23/2014      PT End of Session - 09/23/14 1148    Visit Number 1   Number of Visits 12   Date for PT Re-Evaluation 11/04/14   PT Start Time 0904   PT Stop Time 0944   PT Time Calculation (min) 40 min   Activity Tolerance Patient tolerated treatment well   Behavior During Therapy [facility] for tasks assessed/performed      Past Medical History  Diagnosis Date  . DVT (deep venous thrombosis)   . PE (pulmonary embolism)   . Seizures     as child from severe ear infections- no meds now and no seizures since age 27  . Arthritis     Past Surgical History  Procedure Laterality Date  . Cesarean section    . Cholecystectomy N/A 12/21/2012    Procedure: LAPAROSCOPIC CHOLECYSTECTOMY;  Surgeon: Donato Heinz, MD;  Location: [facility];  Service: General;  Laterality: N/A;    There were no vitals filed for this visit.  Visit Diagnosis:  Thumb pain, right - Plan: PT plan of care cert/re-cert      Subjective Assessment - 09/23/14 1130    Pertinent History Previous right hand injury which patient said essentially resolved and was nothing like it is now.   Patient Stated Goals Get out of pain.   Currently in Pain? Yes   Pain Score 3    Pain Location Hand   Pain Orientation Right   Pain Descriptors / Indicators Aching;Throbbing;Numbness;Tingling   Pain Type --  Sub-acute.   Pain Onset More than a month ago   Pain Frequency Constant   Aggravating Factors  Wearing brace for long periods of time.   Pain Relieving Factors Rest.   Multiple Pain Sites No            [facility] PT Assessment - 09/23/14 0001    Assessment   Medical Diagnosis Contusion of right hand; Right wrist pain.    Onset Date 07/17/14   Precautions   Precautions None   Balance Screen   Has the patient fallen in the past 6 months Yes   How many times? 1   Has the patient had a decrease in activity level because of a fear of falling?  No   Is the patient reluctant to leave their home because of a fear of falling?  No   ROM / Strength   AROM / PROM / Strength AROM;Strength   AROM   Overall AROM  Within functional limits for tasks performed   Overall AROM Comments Right wrist, hand and finger AROM WNL's.   Strength   Overall Strength Comments Left grip= 70# at the second standardized position of the hand dynamometer and right= 80#.   Palpation   Palpation Patient c/o pain over the dorsum of her right thumb (dorsal aspect) from [facility] to IP.   Special Tests    Special Tests --  Mildly positive right Loralee Pacas Adult PT Treatment/Exercise - 09/23/14 0001    Modalities   Modalities Electrical Stimulation   Electrical Stimulation   Electrical Stimulation Location Right thumb dorsal surface  Electrical Stimulation Parameters 80-150Hz  x 15 minutes constant   Electrical Stimulation Goals Pain                     PT Long Term Goals - 09/23/14 1201    PT LONG TERM GOAL #1   Title Ind with HEP.   Time 6   Period Weeks   PT LONG TERM GOAL #2   Title Perform ADL's with pain not > 1-2/10   Time 6   Period Weeks   Status New   PT LONG TERM GOAL #3   Time 6   Period Weeks   Status New   PT LONG TERM GOAL #4   Title No complaints of right thumb numbness/   Time 6   Period Weeks   Status New   PT LONG TERM GOAL #5   Title Return to full duty work.   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  Her right hand twisted.  She reports 2 fingers and right thumb turned black and blue.  Her current  pain-level is a 3-4/10 but states by the end of the day her pain can rise to 5-6/10 with increased swelling and numbness over right right thumb(dorsl aspect). The patient states she is motivated to get back to her normal job duties and get off light duty.                                                                             Pt will benefit from skilled therapeutic intervention in order to improve on the following deficits Impaired sensation;Decreased activity tolerance   Rehab Potential Excellent   PT Frequency 2x / week   PT Duration 6 weeks  or 12 visits.   PT Treatment/Interventions ADLs/Self Care Home Management;Moist Heat;Therapeutic activities;Patient/family education;Therapeutic exercise;Passive range of motion;Manual techniques;Ultrasound   PT Next Visit Plan Modalities and STW/M to right thumb.   Consulted and Agree with Plan of Care Patient         Problem List There are no active problems to display for this patient.   Odilon Cass, [place] MPT 09/23/2014, 12:16 PM  [facility] [address] [place], [place], [address] Phone: [phone]   Fax:  [phone]

## 2014-09-27 ENCOUNTER — Encounter: Payer: Self-pay | Admitting: Physical Therapy

## 2014-09-27 ENCOUNTER — Ambulatory Visit: Payer: PRIVATE HEALTH INSURANCE | Admitting: Physical Therapy

## 2014-09-27 DIAGNOSIS — M79644 Pain in right finger(s): Secondary | ICD-10-CM

## 2014-09-27 NOTE — Therapy (Signed)
[facility] [place], [place], [address] Phone: [phone]   Fax:  [phone]  Physical Therapy Treatment  Patient Details  Name: Jaime Massey MRN: [id number] Date of Birth: 02/16/1976 Referring Provider:  Deloria Lair., MD  Encounter Date: 09/27/2014      PT End of Session - 09/27/14 1622    Visit Number 2   Number of Visits 12   Date for PT Re-Evaluation 11/04/14   PT Start Time 1401   PT Stop Time 1452   PT Time Calculation (min) 51 min   Activity Tolerance Patient tolerated treatment well   Behavior During Therapy [facility] for tasks assessed/performed      Past Medical History  Diagnosis Date  . DVT (deep venous thrombosis)   . PE (pulmonary embolism)   . Seizures     as child from severe ear infections- no meds now and no seizures since age 46  . Arthritis     Past Surgical History  Procedure Laterality Date  . Cesarean section    . Cholecystectomy N/A 12/21/2012    Procedure: LAPAROSCOPIC CHOLECYSTECTOMY;  Surgeon: Donato Heinz, MD;  Location: [facility];  Service: General;  Laterality: N/A;    There were no vitals filed for this visit.  Visit Diagnosis:  Thumb pain, right      Subjective Assessment - 09/27/14 1606    Symptoms Reports its feeling "about the same." Has a numb/tingling feeling in it. Has just gotten off work so she has been using her hands all day. Has good days and bad days. Now wears a shorter brace with thumb open so she can move thumb and not swelling. 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 Only uses brace at work and has not been icing at home.    Pertinent History Previous right hand injury which patient said essentially resolved and was nothing like it is now.   Patient Stated Goals Get out of pain.   Currently in Pain? Yes   Pain Score 4    Pain Location Hand   Pain Orientation Right   Pain Descriptors  / Indicators Numbness;Tingling   Pain Onset More than a month ago   Aggravating Factors  Prolonged use of full brace   Pain Relieving Factors Rest                       [facility] Adult PT Treatment/Exercise - 09/27/14 0001    Exercises   Exercises Hand   Hand Exercises   Theraputty - Roll Yellow putty   Other Hand Exercises Red web grip   Making fist, thumb adduction with each digit.    Acupuncturist Location Right thumb dorsal surface   Electrical Stimulation Action IFC   Electrical Stimulation Parameters 80-150Hz  x 15 min constant   Electrical Stimulation Goals Pain   Manual Therapy   Manual Therapy Massage   Massage STW/M to R thumb into lateral wrist x8 minutes                     PT Long Term Goals - 09/27/14 1623    PT LONG TERM GOAL #1   Title Ind with HEP.   Time 6   Period Weeks   Status On-going   PT LONG TERM GOAL #2   Title Perform ADL's with pain not > 1-2/10  Time 6   Period Weeks   Status On-going  Patient reports that she can complete ADLS with pain <8-4/53 mostly but its not reallly pain just numbness, tingling   PT LONG TERM GOAL #4   Title No complaints of right thumb numbness/   Time 6   Period Weeks   Status On-going   PT LONG TERM GOAL #5   Title Return to full duty work.   Time 6   Period Weeks   Status On-going               Plan - 09/27/14 1639    Clinical Impression Statement Patient tolerated treatment well today. Denied any pain during exercises. All goals remain on-going at this time. Normal e-stim response observed following e-stim removal. Denied pain at the end of the treatment only continued numbness and tingling.   Pt will benefit from skilled therapeutic intervention in order to improve on the following deficits Impaired sensation;Decreased activity tolerance   Rehab Potential Excellent   PT Frequency 2x / week   PT Duration 6 weeks   PT Treatment/Interventions  ADLs/Self Care Home Management;Moist Heat;Therapeutic activities;Patient/family education;Therapeutic exercise;Passive range of motion;Manual techniques;Ultrasound   PT Next Visit Plan Continue PT POC. Consider initiating wrist strengthening exercises as symptoms dictate next session.   Consulted and Agree with Plan of Care Patient        Problem List There are no active problems to display for this patient.   Wynelle Fanny, PTA 09/27/2014, 4:57 PM  [facility] [address] [place], [place], [address] Phone: [phone]   Fax:  [phone]

## 2014-10-03 ENCOUNTER — Ambulatory Visit: Payer: PRIVATE HEALTH INSURANCE | Admitting: *Deleted

## 2014-10-03 DIAGNOSIS — M79644 Pain in right finger(s): Secondary | ICD-10-CM | POA: Diagnosis not present

## 2014-10-03 NOTE — Therapy (Signed)
[facility] [place], [place], [address] Phone: [phone]   Fax:  [phone]  Physical Therapy Treatment  Patient Details  Name: Jaime Massey MRN: [id number] Date of Birth: March 01, 1976 Referring Provider:  Latanya Maudlin, MD  Encounter Date: 10/03/2014      PT End of Session - 10/03/14 1656    Visit Number 3   Number of Visits 12   Date for PT Re-Evaluation 11/04/14   PT Start Time 1601   PT Stop Time 1649   PT Time Calculation (min) 48 min   Activity Tolerance Patient tolerated treatment well   Behavior During Therapy [facility] for tasks assessed/performed      Past Medical History  Diagnosis Date  . DVT (deep venous thrombosis)   . PE (pulmonary embolism)   . Seizures     as child from severe ear infections- no meds now and no seizures since age 69  . Arthritis     Past Surgical History  Procedure Laterality Date  . Cesarean section    . Cholecystectomy N/A 12/21/2012    Procedure: LAPAROSCOPIC CHOLECYSTECTOMY;  Surgeon: Donato Heinz, MD;  Location: [facility];  Service: General;  Laterality: N/A;    There were no vitals filed for this visit.  Visit Diagnosis:  Thumb pain, right      Subjective Assessment - 10/03/14 1604    Symptoms Pt feels that she has less numbness today in her RT thumb and it is easier to grip things    Pertinent History Previous right hand injury which patient said essentially resolved and was nothing like it is now.   Patient Stated Goals Get out of pain.   Currently in Pain? Yes   Pain Score 2    Pain Location Hand   Pain Orientation Right   Pain Descriptors / Indicators Numbness;Burning   Pain Onset More than a month ago   Pain Frequency Constant   Aggravating Factors  brace   Pain Relieving Factors PT Rxs                       [facility] Adult PT Treatment/Exercise - 10/03/14 0001    Exercises   Exercises Hand   Hand Exercises   Theraputty - Roll Yellow putty    Theraputty - Grip yellow putty   Theraputty - Pinch Yellow putty   Other Hand Exercises Red web grip   Fist grasping and finger pinching, adduction   Electrical Stimulation   Electrical Stimulation Location Right thumb dorsal surface   Electrical Stimulation Action premod   Electrical Stimulation Parameters 80-150   Electrical Stimulation Goals Pain   Manual Therapy   Manual Therapy Myofascial release   Myofascial Release IASTM to Hand flexors/extensors f/b STW to same muscluture  and thenar eminance with Pt sitting                     PT Long Term Goals - 09/27/14 1623    PT LONG TERM GOAL #1   Title Ind with HEP.   Time 6   Period Weeks   Status On-going   PT LONG TERM GOAL #2   Title Perform ADL's with pain not > 1-2/10   Time 6   Period Weeks   Status On-going  Patient reports that she can complete ADLS with pain <3-4/03 mostly but its not reallly pain just numbness, tingling   PT LONG TERM GOAL #4   Title No complaints of  right thumb numbness/   Time 6   Period Weeks   Status On-going   PT LONG TERM GOAL #5   Title Return to full duty work.   Time 6   Period Weeks   Status On-going               Plan - 10/03/14 1701    Clinical Impression Statement Pt did fairly well with Rx today and has less numbness in thumb. She was able to perform gripping exs a little easier today   Pt will benefit from skilled therapeutic intervention in order to improve on the following deficits Impaired sensation;Decreased activity tolerance   Rehab Potential Excellent   PT Frequency 2x / week   PT Duration 6 weeks   PT Treatment/Interventions ADLs/Self Care Home Management;Moist Heat;Therapeutic activities;Patient/family education;Therapeutic exercise;Passive range of motion;Manual techniques;Ultrasound   PT Next Visit Plan Continue PT POC. Consider initiating wrist strengthening exercises as symptoms dictate next session.        Problem List There are no  active problems to display for this patient.   RAMSEUR,CHRIS, PTA 10/03/2014, 5:17 PM  [facility] [address] [place], [place], [address] Phone: [phone]   Fax:  [phone]

## 2014-10-06 ENCOUNTER — Ambulatory Visit: Payer: PRIVATE HEALTH INSURANCE | Admitting: *Deleted

## 2014-10-06 ENCOUNTER — Encounter: Payer: Self-pay | Admitting: *Deleted

## 2014-10-06 DIAGNOSIS — M79644 Pain in right finger(s): Secondary | ICD-10-CM | POA: Diagnosis not present

## 2014-10-06 NOTE — Therapy (Signed)
[facility] [place], [place], [address] Phone: [phone]   Fax:  [phone]  Physical Therapy Treatment  Patient Details  Name: Jaime Massey MRN: [id number] Date of Birth: [birth year] Referring Provider:  Deloria Lair., MD  Encounter Date: 10/06/2014      PT End of Session - 10/06/14 8309    Visit Number 4   Number of Visits 12   Date for PT Re-Evaluation 11/04/14   PT Start Time 1603      Past Medical History  Diagnosis Date  . DVT (deep venous thrombosis)   . PE (pulmonary embolism)   . Seizures     as child from severe ear infections- no meds now and no seizures since age 50  . Arthritis     Past Surgical History  Procedure Laterality Date  . Cesarean section    . Cholecystectomy N/A 12/21/2012    Procedure: LAPAROSCOPIC CHOLECYSTECTOMY;  Surgeon: Donato Heinz, MD;  Location: [facility];  Service: General;  Laterality: N/A;    There were no vitals filed for this visit.  Visit Diagnosis:  Thumb pain, right      Subjective Assessment - 10/06/14 1611    Symptoms Pt feels that she has less numbness today in her RT thumb and it is easier to grip things    Pertinent History Previous right hand injury which patient said essentially resolved and was nothing like it is now.   Patient Stated Goals Get out of pain.   Pain Location Hand   Pain Orientation Right   Pain Descriptors / Indicators Burning;Numbness   Pain Onset More than a month ago   Pain Frequency Constant   Aggravating Factors  brace   Pain Relieving Factors PT Rxs            [facility] PT Assessment - 10/06/14 0001    Strength   Overall Strength Comments second standardized position of the hand dynamometer right= 80#.                   [facility] Adult PT Treatment/Exercise - 10/06/14 0001    Exercises   Exercises Hand   Hand Exercises   Other Hand Exercises Red web grip    Other Hand Exercises Red digiflex pinching and  gripping   Electrical Stimulation   Electrical Stimulation Location Right thumb dorsal surface   Electrical Stimulation Action premod   Electrical Stimulation Parameters 80-150   Electrical Stimulation Goals Pain   Manual Therapy   Manual Therapy Myofascial release   Massage STW/M to R thumb into lateral wrist x8 minutes   Myofascial Release IASTM to Hand flexors/extensors f/b STW to same muscluture  and thenar eminance with Pt sitting                     PT Long Term Goals - 09/27/14 1623    PT LONG TERM GOAL #1   Title Ind with HEP.   Time 6   Period Weeks   Status On-going   PT LONG TERM GOAL #2   Title Perform ADL's with pain not > 1-2/10   Time 6   Period Weeks   Status On-going  Patient reports that she can complete ADLS with pain <4-0/76 mostly but its not reallly pain just numbness, tingling   PT LONG TERM GOAL #4   Title No complaints of right thumb numbness/   Time 6   Period Weeks   Status On-going  PT LONG TERM GOAL #5   Title Return to full duty work.   Time 6   Period Weeks   Status On-going               Plan - 10/06/14 1802    Clinical Impression Statement Pt had less numbness in thumb today and feels that she is progressing with PT   Pt will benefit from skilled therapeutic intervention in order to improve on the following deficits Impaired sensation;Decreased activity tolerance   Rehab Potential Excellent   PT Frequency 2x / week   PT Duration 6 weeks   PT Treatment/Interventions ADLs/Self Care Home Management;Moist Heat;Therapeutic activities;Patient/family education;Therapeutic exercise;Passive range of motion;Manual techniques;Ultrasound   PT Next Visit Plan Continue PT POC. Cont. with gripping and thumb opposition exs         Problem List There are no active problems to display for this patient.   Rutherford Alarie,CHRIS, PTA 10/06/2014, 6:11 PM  [facility] [address] [place], [place], [address] Phone: [phone]   Fax:  [phone]

## 2014-10-11 ENCOUNTER — Ambulatory Visit: Payer: PRIVATE HEALTH INSURANCE | Attending: Orthopedic Surgery | Admitting: Physical Therapy

## 2014-10-11 DIAGNOSIS — M79644 Pain in right finger(s): Secondary | ICD-10-CM | POA: Insufficient documentation

## 2014-10-11 NOTE — Therapy (Signed)
[facility] [place], [place], [address] Phone: [phone]   Fax:  [phone]  Physical Therapy Treatment  Patient Details  Name: Jaime Massey MRN: [id number] Date of Birth: 1976/06/08 Referring Provider:  Deloria Lair., MD  Encounter Date: 10/11/2014      PT End of Session - 10/11/14 1611    Visit Number 5   Number of Visits 12   Date for PT Re-Evaluation 11/04/14   PT Start Time 0400   PT Stop Time 0435   PT Time Calculation (min) 35 min   Activity Tolerance Patient tolerated treatment well   Behavior During Therapy [facility] for tasks assessed/performed      Past Medical History  Diagnosis Date  . DVT (deep venous thrombosis)   . PE (pulmonary embolism)   . Seizures     as child from severe ear infections- no meds now and no seizures since age 24  . Arthritis     Past Surgical History  Procedure Laterality Date  . Cesarean section    . Cholecystectomy N/A 12/21/2012    Procedure: LAPAROSCOPIC CHOLECYSTECTOMY;  Surgeon: Donato Heinz, MD;  Location: [facility];  Service: General;  Laterality: N/A;    There were no vitals filed for this visit.  Visit Diagnosis:  Thumb pain, right      Subjective Assessment - 10/11/14 1612    Subjective Feeling is starting to come back in hand.   Pain Score 2    Pain Location Hand   Pain Orientation Right   Pain Descriptors / Indicators Burning;Numbness   Pain Frequency Constant                       [facility] Adult PT Treatment/Exercise - 10/11/14 0001    Electrical Stimulation   Electrical Stimulation Location Right dorsal thumb   Electrical Stimulation Action Pre-mod   Electrical Stimulation Parameters 80-150 HZ constant x 20 minutes.   Manual Therapy   Massage --  STW/M IASTM x 8 mins and gentle thumb mobs.                     PT Long Term Goals - 09/27/14 1623    PT LONG TERM GOAL #1   Title Ind with HEP.   Time 6   Period Weeks    Status On-going   PT LONG TERM GOAL #2   Title Perform ADL's with pain not > 1-2/10   Time 6   Period Weeks   Status On-going  Patient reports that she can complete ADLS with pain <8-5/63 mostly but its not reallly pain just numbness, tingling   PT LONG TERM GOAL #4   Title No complaints of right thumb numbness/   Time 6   Period Weeks   Status On-going   PT LONG TERM GOAL #5   Title Return to full duty work.   Time 6   Period Weeks   Status On-going               Problem List There are no active problems to display for this patient.   APPLEGATE, [place] MPT 10/11/2014, 4:38 PM  [facility] [address] [place], [place], [address] Phone: [phone]   Fax:  [phone]

## 2014-10-13 ENCOUNTER — Ambulatory Visit: Payer: PRIVATE HEALTH INSURANCE | Admitting: Physical Therapy

## 2014-10-13 DIAGNOSIS — M79644 Pain in right finger(s): Secondary | ICD-10-CM

## 2014-10-13 NOTE — Therapy (Signed)
[facility] [place], [place], [address] Phone: [phone]   Fax:  [phone]  Physical Therapy Treatment  Patient Details  Name: Jaime Massey MRN: [id number] Date of Birth: 01-Aug-1975 Referring Provider:  Deloria Lair., MD  Encounter Date: 10/13/2014      PT End of Session - 10/13/14 1608    Visit Number 6   Number of Visits 12   PT Start Time 0405   PT Stop Time 0439   PT Time Calculation (min) 34 min      Past Medical History  Diagnosis Date  . DVT (deep venous thrombosis)   . PE (pulmonary embolism)   . Seizures     as child from severe ear infections- no meds now and no seizures since age 39  . Arthritis     Past Surgical History  Procedure Laterality Date  . Cesarean section    . Cholecystectomy N/A 12/21/2012    Procedure: LAPAROSCOPIC CHOLECYSTECTOMY;  Surgeon: Donato Heinz, MD;  Location: [facility];  Service: General;  Laterality: N/A;    There were no vitals filed for this visit.  Visit Diagnosis:  Thumb pain, right      Subjective Assessment - 10/13/14 1611    Subjective About the same today.   Pain Score 2    Pain Location Hand   Pain Orientation Right   Pain Descriptors / Indicators Burning;Numbness   Pain Onset More than a month ago                       [facility] Adult PT Treatment/Exercise - 10/13/14 1612    Electrical Stimulation   Electrical Stimulation Location Right dorsal thumb   Electrical Stimulation Action Pre-mod   Electrical Stimulation Parameters 80-150 Hz x 20 minutes.   Manual Therapy   Myofascial Release IASTM to patient's right thumb x 8 minutes.                     PT Long Term Goals - 09/27/14 1623    PT LONG TERM GOAL #1   Title Ind with HEP.   Time 6   Period Weeks   Status On-going   PT LONG TERM GOAL #2   Title Perform ADL's with pain not > 1-2/10   Time 6   Period Weeks   Status On-going  Patient reports that she can  complete ADLS with pain <4-1/93 mostly but its not reallly pain just numbness, tingling   PT LONG TERM GOAL #4   Title No complaints of right thumb numbness/   Time 6   Period Weeks   Status On-going   PT LONG TERM GOAL #5   Title Return to full duty work.   Time 6   Period Weeks   Status On-going               Plan - 10/13/14 1631    PT Next Visit Plan Pulsed U/S in area of left CMCJ.   Consulted and Agree with Plan of Care Patient        Problem List There are no active problems to display for this patient.   APPLEGATE, [place] MPT 10/13/2014, 4:39 PM  [facility] [address] [place], [place], [address] Phone: [phone]   Fax:  [phone]

## 2014-10-18 ENCOUNTER — Ambulatory Visit: Payer: PRIVATE HEALTH INSURANCE | Admitting: Physical Therapy

## 2014-10-18 DIAGNOSIS — M79644 Pain in right finger(s): Secondary | ICD-10-CM | POA: Diagnosis not present

## 2014-10-18 NOTE — Therapy (Signed)
[facility] [place], [place], [address] Phone: [phone]   Fax:  [phone]  Physical Therapy Treatment  Patient Details  Name: Jaime Massey MRN: [id number] Date of Birth: 10/12/1975 Referring Provider:  Deloria Lair., MD  Encounter Date: 10/18/2014      PT End of Session - 10/18/14 1635    Visit Number 7   Number of Visits 12   Date for PT Re-Evaluation 11/04/14   PT Start Time 1600   PT Stop Time 4166   PT Time Calculation (min) 47 min   Activity Tolerance Patient tolerated treatment well   Behavior During Therapy [facility] for tasks assessed/performed      Past Medical History  Diagnosis Date  . DVT (deep venous thrombosis)   . PE (pulmonary embolism)   . Seizures     as child from severe ear infections- no meds now and no seizures since age 28  . Arthritis     Past Surgical History  Procedure Laterality Date  . Cesarean section    . Cholecystectomy N/A 12/21/2012    Procedure: LAPAROSCOPIC CHOLECYSTECTOMY;  Surgeon: Donato Heinz, MD;  Location: [facility];  Service: General;  Laterality: N/A;    There were no vitals filed for this visit.  Visit Diagnosis:  Thumb pain, right      Subjective Assessment - 10/18/14 1610    Subjective Getting better with sensations. States that if she moves her thumb a lot then the pain increases. Grip strength continues to be decreased.    Pertinent History Previous right hand injury which patient said essentially resolved and was nothing like it is now.   Patient Stated Goals Get out of pain.   Currently in Pain? Yes   Pain Score 4    Pain Location Head   Pain Orientation Right   Pain Descriptors / Indicators Tiring   Pain Onset More than a month ago   Pain Frequency Intermittent            [facility] PT Assessment - 10/18/14 0001    Assessment   Medical Diagnosis Contusion of right hand; Right wrist pain.   Onset Date 07/17/14                    [facility] Adult PT Treatment/Exercise - 10/18/14 0001    Hand Exercises   Theraputty - Flatten yellow    Theraputty - Grip yellow putty   Theraputty - Pinch Yellow putty   Digiticizer x11mn   Other Hand Exercises Red web grip    Other Hand Exercises Ball squeezes x 3 min   Electrical Stimulation   Electrical Stimulation Location Right thumb and distal lateral forearm   Electrical Stimulation Action Pre-mod   Electrical Stimulation Parameters 80-150 Hz x 15 min   Electrical Stimulation Goals Pain                     PT Long Term Goals - 10/18/14 1633    PT LONG TERM GOAL #1   Title Ind with HEP.   Time 6   Period Weeks   Status On-going   PT LONG TERM GOAL #2   Title Perform ADL's with pain not > 1-2/10   Time 6   Period Weeks   Status Achieved   PT LONG TERM GOAL #4   Title No complaints of right thumb numbness/   Time 6   Period Weeks   Status On-going   PT LONG  TERM GOAL #5   Title Return to full duty work.   Time 6   Period Weeks   Status On-going               Plan - 10/18/14 1635    Clinical Impression Statement Patient tolerated treatment well and is improving in regards to sensation, grip strength continues to be limitation for patient. Normal e-stim response observed following removal of modality. Achieved ADLs goal at today's session. Experienced numbness from e-stim machine sensation but denies pain.   Pt will benefit from skilled therapeutic intervention in order to improve on the following deficits Impaired sensation;Decreased activity tolerance   Rehab Potential Excellent   PT Frequency 2x / week   PT Duration 6 weeks   PT Treatment/Interventions ADLs/Self Care Home Management;Moist Heat;Therapeutic activities;Patient/family education;Therapeutic exercise;Passive range of motion;Manual techniques;Ultrasound   PT Next Visit Plan Continue per PT POC. Consider pulsed [place] to L CMC joint as symptoms dictate. Consider giving theraputty HEP next  session.   Consulted and Agree with Plan of Care Patient        Problem List There are no active problems to display for this patient.   Wynelle Fanny, PTA 10/18/2014, 4:50 PM  [facility] [address] [place], [place], [address] Phone: [phone]   Fax:  [phone]

## 2014-10-20 ENCOUNTER — Ambulatory Visit: Payer: PRIVATE HEALTH INSURANCE | Admitting: Physical Therapy

## 2014-10-20 ENCOUNTER — Encounter: Payer: Self-pay | Admitting: Physical Therapy

## 2014-10-20 DIAGNOSIS — M79644 Pain in right finger(s): Secondary | ICD-10-CM | POA: Diagnosis not present

## 2014-10-20 NOTE — Therapy (Signed)
[facility] [place], [place], [address] Phone: [phone]   Fax:  [phone]  Physical Therapy Treatment  Patient Details  Name: Jaime Massey MRN: [id number] Date of Birth: Nov 18, 1975 Referring Provider:  Deloria Lair., MD  Encounter Date: 10/20/2014      PT End of Session - 10/20/14 1605    Visit Number 8   Number of Visits 12   Date for PT Re-Evaluation 11/04/14   PT Start Time 1600   PT Stop Time 1645   PT Time Calculation (min) 45 min   Activity Tolerance Patient tolerated treatment well   Behavior During Therapy [facility] for tasks assessed/performed      Past Medical History  Diagnosis Date  . DVT (deep venous thrombosis)   . PE (pulmonary embolism)   . Seizures     as child from severe ear infections- no meds now and no seizures since age 6  . Arthritis     Past Surgical History  Procedure Laterality Date  . Cesarean section    . Cholecystectomy N/A 12/21/2012    Procedure: LAPAROSCOPIC CHOLECYSTECTOMY;  Surgeon: Donato Heinz, MD;  Location: [facility];  Service: General;  Laterality: N/A;    There were no vitals filed for this visit.  Visit Diagnosis:  Thumb pain, right      Subjective Assessment - 10/20/14 1606    Subjective Patient states that the main limitation she thinks is weakness. States that she has been doing more at work and it has been sore due to doing more at work. Still has some numbness and tingling. Improvement with feeling is 50% better, and strength is continuing to progress per patient.    Pertinent History Previous right hand injury which patient said essentially resolved and was nothing like it is now.   Patient Stated Goals Get out of pain.   Currently in Pain? No/denies            [facility] PT Assessment - 10/20/14 0001    Assessment   Medical Diagnosis Contusion of right hand; Right wrist pain.   Onset Date 07/17/14                   [facility] Adult PT  Treatment/Exercise - 10/20/14 0001    Exercises   Exercises Hand   Hand Exercises   Theraputty - Flatten Yellow Theraputty   Theraputty - Grip yellow putty   Theraputty - Pinch Yellow putty   Digiticizer x14mn   Other Hand Exercises Red web grip and thumb opposition   Other Hand Exercises Ball squeezes x 3 min; Roll red pin x 3 min   Modalities   Modalities Electrical Stimulation   Electrical Stimulation   Electrical Stimulation Location Lateral thumb and forearm   Electrical Stimulation Action Pre-Mod   Electrical Stimulation Parameters 80-150 Hz x 20 min   Electrical Stimulation Goals Pain                PT Education - 10/20/14 1616    Education provided Yes   Education Details HEP: towel squeezes, flatten, opposition, pinch and grip theraputty; yellow theraputty given for home use.    Person(s) Educated Patient   Methods Explanation;Demonstration;Handout   Comprehension Verbalized understanding;Returned demonstration             PT Long Term Goals - 10/18/14 1633    PT LONG TERM GOAL #1   Title Ind with HEP.   Time 6   Period Weeks  Status On-going   PT LONG TERM GOAL #2   Title Perform ADL's with pain not > 1-2/10   Time 6   Period Weeks   Status Achieved   PT LONG TERM GOAL #4   Title No complaints of right thumb numbness/   Time 6   Period Weeks   Status On-going   PT LONG TERM GOAL #5   Title Return to full duty work.   Time 6   Period Weeks   Status On-going               Plan - 10/20/14 1632    Clinical Impression Statement Patient tolerated treatment well. Welcomed new HEP exercises that could be taken out of town with her next week for family funeral and verbalized understanding. Normal e-stim response observed following the removal of the modaliy. Experienced 7-8/10 soreness in R thumb in hypothenar musculature from movement and  activity.   Pt will benefit from skilled therapeutic intervention in order to improve on the  following deficits Impaired sensation;Decreased activity tolerance   Rehab Potential Excellent   PT Frequency 2x / week   PT Duration 6 weeks   PT Treatment/Interventions ADLs/Self Care Home Management;Moist Heat;Therapeutic activities;Patient/family education;Therapeutic exercise;Passive range of motion;Manual techniques;Ultrasound   PT Next Visit Plan Continue per PT POC. Consider pulsed [place] to L CMC joint as symptoms dictate.   Consulted and Agree with Plan of Care Patient        Problem List There are no active problems to display for this patient.   Wynelle Fanny, PTA 10/20/2014, 4:54 PM  [facility] [address] [place], [place], [address] Phone: [phone]   Fax:  [phone]

## 2014-10-20 NOTE — Patient Instructions (Signed)
Towel Roll Squeeze   With right forearm resting on surface, gently squeeze towel. Repeat ____ times per set. Do ____ sets per session. Do ____ sessions per day.  Copyright  VHI. All rights reserved.  Finger Opposition   Actively touch right thumb to each fingertip. Start with index finger and proceed toward little finger. Move slowly at first, then more rapidly as motion and coordination improve. Be sure to touch each fingertip. Repeat for 3 minutes with Theraputty in hand;   Touch each finger with your thumb Flatten the theraputty Grip the theraputty  Pinch the theraputty   Copyright  VHI. All rights reserved.

## 2014-11-10 ENCOUNTER — Encounter: Payer: Self-pay | Admitting: Physical Therapy

## 2014-11-15 ENCOUNTER — Ambulatory Visit: Payer: PRIVATE HEALTH INSURANCE | Attending: Orthopedic Surgery | Admitting: Physical Therapy

## 2014-11-15 DIAGNOSIS — M79644 Pain in right finger(s): Secondary | ICD-10-CM | POA: Insufficient documentation

## 2014-11-15 NOTE — Therapy (Signed)
[facility] [place], [place], [address] Phone: [phone]   Fax:  [phone]  Physical Therapy Treatment  Patient Details  Name: Jaime Massey MRN: [id number] Date of Birth: 06-12-1976 Referring Provider:  Deloria Lair., MD  Encounter Date: 11/15/2014      PT End of Session - 11/15/14 1634    Visit Number 9   Number of Visits 12   Date for PT Re-Evaluation 11/29/14   PT Start Time 0400   PT Stop Time 0459   PT Time Calculation (min) 59 min   Activity Tolerance Patient tolerated treatment well   Behavior During Therapy [facility] for tasks assessed/performed      Past Medical History  Diagnosis Date  . DVT (deep venous thrombosis)   . PE (pulmonary embolism)   . Seizures     as child from severe ear infections- no meds now and no seizures since age 50  . Arthritis     Past Surgical History  Procedure Laterality Date  . Cesarean section    . Cholecystectomy N/A 12/21/2012    Procedure: LAPAROSCOPIC CHOLECYSTECTOMY;  Surgeon: Donato Heinz, MD;  Location: [facility];  Service: General;  Laterality: N/A;    There were no vitals filed for this visit.  Visit Diagnosis:  Thumb pain, right      Subjective Assessment - 11/15/14 1633    Subjective Been out of therapy several weeks now.  Numbness in thumb better but went to a concert and held my phone to take pictures for an extended period of time recently and pain rose to an 8/10.   Pain Score 5    Pain Location Finger (Comment which one)   Pain Orientation Right   Pain Descriptors / Indicators Aching   Pain Onset More than a month ago                                      PT Long Term Goals - 11/15/14 1716    PT LONG TERM GOAL #1   Title Ind with HEP.   Time 6   Period Weeks   Status On-going   PT LONG TERM GOAL #2   Title Perform ADL's with pain not > 1-2/10   Time 6   Period Weeks   Status Partially Met   PT LONG TERM GOAL  #3   Title Return to full duty work.   Time 6   Period Weeks   Status New   PT LONG TERM GOAL #4   Title No complaints of right thumb numbness/   Time 6   Period Weeks   Status On-going     TREATMENT:  Red power web x 5 minutes (right hand).   Pre-mod e' stim to affected right thumb x 20 minutes.  Combo U/S-E'Stim at 1.50 W/CM2 x 12 minutes at 50%.  STW/M and joint mobs x 11 minutes.         Plan - 11/15/14 1714    Clinical Impression Statement The patient tolerated treatmetn well and reported decreased pain following.   Pt will benefit from skilled therapeutic intervention in order to improve on the following deficits Pain;Decreased activity tolerance   Rehab Potential Excellent   PT Frequency --  3 visits remaining.   PT Duration 2 weeks   PT Treatment/Interventions ADLs/Self Care Home Management;Moist Heat;Therapeutic activities;Patient/family education;Therapeutic exercise;Ultrasound;Manual techniques;Cryotherapy;Electrical Stimulation;Passive range of motion  PT Next Visit Plan Continue per PT POC. Consider pulsed [place] to L CMC joint as symptoms dictate.   Consulted and Agree with Plan of Care Patient        Problem List There are no active problems to display for this patient.   Keirstan Iannello, [place] MPT 11/15/2014, 5:18 PM  [facility] [address] [place], [place], [address] Phone: [phone]   Fax:  [phone]

## 2014-11-17 ENCOUNTER — Encounter: Payer: Self-pay | Admitting: Physical Therapy

## 2014-11-17 ENCOUNTER — Ambulatory Visit: Payer: PRIVATE HEALTH INSURANCE | Admitting: Physical Therapy

## 2014-11-17 DIAGNOSIS — M79644 Pain in right finger(s): Secondary | ICD-10-CM | POA: Diagnosis not present

## 2014-11-17 NOTE — Therapy (Signed)
[facility] [place], [place], [address] Phone: [phone]   Fax:  [phone]  Physical Therapy Treatment  Patient Details  Name: Jaime Massey MRN: [id number] Date of Birth: [birth year] Referring Provider:  Deloria Lair., MD  Encounter Date: 11/17/2014      PT End of Session - 11/17/14 1441    Visit Number 10   Number of Visits 12   Date for PT Re-Evaluation 11/29/14   PT Start Time 6063   PT Stop Time 1456   PT Time Calculation (min) 20 min   Activity Tolerance Patient tolerated treatment well   Behavior During Therapy [facility] for tasks assessed/performed      Past Medical History  Diagnosis Date  . DVT (deep venous thrombosis)   . PE (pulmonary embolism)   . Seizures     as child from severe ear infections- no meds now and no seizures since age 39  . Arthritis     Past Surgical History  Procedure Laterality Date  . Cesarean section    . Cholecystectomy N/A 12/21/2012    Procedure: LAPAROSCOPIC CHOLECYSTECTOMY;  Surgeon: Donato Heinz, MD;  Location: [facility];  Service: General;  Laterality: N/A;    There were no vitals filed for this visit.  Visit Diagnosis:  Thumb pain, right      Subjective Assessment - 11/17/14 1438    Subjective States that her hand is sore today. States that thumb has been swelling and thinks it may be from brace. Says that it doesn't really bother her until after she's worn the brace for a while.   Pertinent History Previous right hand injury which patient said essentially resolved and was nothing like it is now.   Patient Stated Goals Get out of pain.   Currently in Pain? No/denies            [facility] PT Assessment - 11/17/14 0001    Assessment   Medical Diagnosis Contusion of right hand; Right wrist pain.   Onset Date 07/17/14                     [facility] Adult PT Treatment/Exercise - 11/17/14 0001    Exercises   Exercises Hand   Hand Exercises   Other Hand  Exercises Red web grip x3 min and thumb opposition x43mn, radial deviate with hammer x30 reps, towel squeeze x30 reps   Other Hand Exercises Ball squeezes x 4 min; Roll red pin x 3 min                     PT Long Term Goals - 11/17/14 1441    PT LONG TERM GOAL #1   Title Ind with HEP.   Time 6   Period Weeks   Status Achieved   PT LONG TERM GOAL #2   Title Perform ADL's with pain not > 1-2/10   Time 6   Period Weeks   Status Achieved   PT LONG TERM GOAL #3   Title Return to full duty work.   Time 6   Period Weeks   Status On-going   PT LONG TERM GOAL #4   Title No complaints of right thumb numbness/   Time 6   Period Weeks   Status On-going               Plan - 11/17/14 1457    Clinical Impression Statement Treatment was short due to time contraints. Otherwise patient tolerated  treatment well without complaint of pain.   Pt will benefit from skilled therapeutic intervention in order to improve on the following deficits Pain;Decreased activity tolerance   Rehab Potential Excellent   PT Duration 2 weeks   PT Treatment/Interventions ADLs/Self Care Home Management;Moist Heat;Therapeutic activities;Patient/family education;Therapeutic exercise;Ultrasound;Manual techniques;Cryotherapy;Electrical Stimulation;Passive range of motion   PT Next Visit Plan Continue per PT POC. Consider pulsed [place] to L CMC joint as symptoms dictate.   Consulted and Agree with Plan of Care Patient        Problem List There are no active problems to display for this patient.   Wynelle Fanny, PTA 11/17/2014, 3:00 PM  [facility] [address] [place], [place], [address] Phone: [phone]   Fax:  [phone]

## 2014-11-22 ENCOUNTER — Ambulatory Visit: Payer: PRIVATE HEALTH INSURANCE | Admitting: *Deleted

## 2014-11-22 ENCOUNTER — Encounter: Payer: Self-pay | Admitting: *Deleted

## 2014-11-22 DIAGNOSIS — M79644 Pain in right finger(s): Secondary | ICD-10-CM

## 2014-11-22 NOTE — Therapy (Signed)
[facility] [place], [place], [address] Phone: [phone]   Fax:  [phone]  Physical Therapy Treatment  Patient Details  Name: Jaime Massey MRN: [id number] Date of Birth: [birth year] Referring Provider:  Deloria Lair., MD  Encounter Date: 11/22/2014      PT End of Session - 11/22/14 8338    Visit Number 11   Number of Visits 12   Date for PT Re-Evaluation 11/29/14   PT Start Time 1600   PT Stop Time 1648   PT Time Calculation (min) 48 min      Past Medical History  Diagnosis Date  . DVT (deep venous thrombosis)   . PE (pulmonary embolism)   . Seizures     as child from severe ear infections- no meds now and no seizures since age 40  . Arthritis     Past Surgical History  Procedure Laterality Date  . Cesarean section    . Cholecystectomy N/A 12/21/2012    Procedure: LAPAROSCOPIC CHOLECYSTECTOMY;  Surgeon: Donato Heinz, MD;  Location: [facility];  Service: General;  Laterality: N/A;    There were no vitals filed for this visit.  Visit Diagnosis:  Thumb pain, right      Subjective Assessment - 11/22/14 1610    Subjective States that her hand is sore today. States that thumb has been swelling and thinks it may be from brace. Says that it doesn't really bother her until after she's worn the brace for a while. Went to MD yesterday and he gave me a new brace, but not sure about cont. PT.     Pertinent History Previous right hand injury which patient said essentially resolved and was nothing like it is now.   Patient Stated Goals Get out of pain.   Currently in Pain? Yes   Pain Score 0-No pain   Pain Location Finger (Comment which one)   Pain Orientation Right   Pain Descriptors / Indicators Aching   Pain Onset More than a month ago   Pain Frequency Intermittent   Aggravating Factors  old brace   Pain Relieving Factors PT RXs                         [facility] Adult PT Treatment/Exercise -  11/22/14 0001    Exercises   Exercises Hand   Hand Exercises   Digiticizer x25mn   Other Hand Exercises Red web grip x3 min and thumb opposition x452m,   Other Hand Exercises Roll red pin: wrist ext/flexion, supination/pronation   Modalities   Modalities Electrical Stimulation;Ultrasound   Electrical Stimulation   Electrical Stimulation Location Lateral thumb and forearm   Electrical Stimulation Action Premod   Electrical Stimulation Parameters 80-150 hz x15 min   Electrical Stimulation Goals Pain   Ultrasound   Ultrasound Location base of RT thumb and into radial aspect   Ultrasound Parameters 1 w/cm2, 50% x8 min   Ultrasound Goals Pain  and numbness                     PT Long Term Goals - 11/17/14 1441    PT LONG TERM GOAL #1   Title Ind with HEP.   Time 6   Period Weeks   Status Achieved   PT LONG TERM GOAL #2   Title Perform ADL's with pain not > 1-2/10   Time 6   Period Weeks   Status Achieved   PT  LONG TERM GOAL #3   Title Return to full duty work.   Time 6   Period Weeks   Status On-going   PT LONG TERM GOAL #4   Title No complaints of right thumb numbness/   Time 6   Period Weeks   Status On-going               Plan - 11/22/14 1644    Clinical Impression Statement Pt feels that she is better over all, but RT thumb goes numb at times when holding things. She gets a throbbing pain at times. She is on LD at work for 4 more weeks, but unsure if she is to cont. PT or not   Pt will benefit from skilled therapeutic intervention in order to improve on the following deficits Pain;Decreased activity tolerance   Rehab Potential Excellent   PT Duration 2 weeks   PT Treatment/Interventions ADLs/Self Care Home Management;Moist Heat;Therapeutic activities;Patient/family education;Therapeutic exercise;Ultrasound;Manual techniques;Cryotherapy;Electrical Stimulation;Passive range of motion   PT Next Visit Plan Continue per PT POC. Pt to call MD office to  find out if she is to cont. with PT or not        Problem List There are no active problems to display for this patient.   Miles Borkowski,CHRIS, PTA 11/22/2014, 4:57 PM  [facility] [address] [place], [place], [address] Phone: [phone]   Fax:  [phone]

## 2014-11-24 ENCOUNTER — Encounter: Payer: Self-pay | Admitting: Physical Therapy

## 2014-11-24 ENCOUNTER — Ambulatory Visit: Payer: PRIVATE HEALTH INSURANCE | Admitting: Physical Therapy

## 2014-11-24 DIAGNOSIS — M79644 Pain in right finger(s): Secondary | ICD-10-CM

## 2014-11-24 NOTE — Therapy (Addendum)
[facility] [place], [place], [address] Phone: [phone]   Fax:  [phone]  Physical Therapy Treatment  Patient Details  Name: Jaime Massey MRN: [id number] Date of Birth: Jul 12, 1975 Referring Provider:  Deloria Lair., MD  Encounter Date: 11/24/2014      PT End of Session - 11/24/14 1600    Visit Number 12   Number of Visits 12   Date for PT Re-Evaluation 11/29/14   PT Start Time 1600   PT Stop Time 1655   PT Time Calculation (min) 55 min   Activity Tolerance Patient tolerated treatment well   Behavior During Therapy [facility] for tasks assessed/performed      Past Medical History  Diagnosis Date  . DVT (deep venous thrombosis)   . PE (pulmonary embolism)   . Seizures     as child from severe ear infections- no meds now and no seizures since age 72  . Arthritis     Past Surgical History  Procedure Laterality Date  . Cesarean section    . Cholecystectomy N/A 12/21/2012    Procedure: LAPAROSCOPIC CHOLECYSTECTOMY;  Surgeon: Donato Heinz, MD;  Location: [facility];  Service: General;  Laterality: N/A;    There were no vitals filed for this visit.  Visit Diagnosis:  Thumb pain, right      Subjective Assessment - 11/24/14 1623    Subjective States that she tried to get in touch with her MD and was on hold for a prolonged period of time. Thinks that new brace and possibly [place] is helping. Not swelling witth new brace.   Pertinent History Previous right hand injury which patient said essentially resolved and was nothing like it is now.   Patient Stated Goals Get out of pain.   Currently in Pain? No/denies            [facility] PT Assessment - 11/24/14 0001    Assessment   Medical Diagnosis Contusion of right hand; Right wrist pain.   Onset Date 07/17/14   Strength   Overall Strength Comments Dynamometer L wrist=approx 84 lbs, R wrist= approx. 61 lbs                      [facility] Adult PT  Treatment/Exercise - 11/24/14 0001    Exercises   Exercises Hand   Hand Exercises   Digiticizer x9mn   Other Hand Exercises Red web grip x3 min and thumb opposition x363m, ball squeeze x3m67m R wrist pronation/supination 2# x30 reps    Other Hand Exercises  Towel drag 4# x30 reps, wrist ext/flex 2# x30 reps each, Radial deviate with hammer x30, towel squeeze x3mi51m Modalities   Modalities Electrical Stimulation;Ultrasound   Electrical Stimulation   Electrical Stimulation Location Lateral thumb and forearm   Electrical Stimulation Action Pre-Mod   Electrical Stimulation Parameters 80-150 Hz x15mi22mElectrical Stimulation Goals Pain   Ultrasound   Ultrasound Location Base of R thumb and into radial aspect of R wrist   Ultrasound Parameters 1 w/cm2, 50% x8min 41mltrasound Goals Pain;Other (Comment)  Numbness                     PT Long Term Goals - 11/24/14 1600    PT LONG TERM GOAL #1   Title Ind with HEP.   Time 6   Period Weeks   Status Achieved   PT LONG TERM GOAL #2   Title Perform ADL's  with pain not > 1-2/10   Time 6   Period Weeks   Status Achieved   PT LONG TERM GOAL #3   Title Return to full duty work.   Time 6   Period Weeks   Status Not Met   PT LONG TERM GOAL #4   Title No complaints of right thumb numbness/   Time 6   Period Weeks   Status Not Met               Plan - 11/24/14 1629    Clinical Impression Statement Patient tolerated treatment well without complaint of pain during exercises. Patient has not returned to full work duty at this time and still has R thumb numbness with some activities meaning the two remaining goals were not achieved at this time. Required minimal verbal cueing and demonstration to correct technique of some exercises. Normal modalities response noted following removal of the modalties. Experienced tingling/numb sensation following therapy but denied pain.   Pt will benefit from skilled therapeutic  intervention in order to improve on the following deficits Pain;Decreased activity tolerance   Rehab Potential Excellent   PT Duration 2 weeks   PT Treatment/Interventions ADLs/Self Care Home Management;Moist Heat;Therapeutic activities;Patient/family education;Therapeutic exercise;Ultrasound;Manual techniques;Cryotherapy;Electrical Stimulation;Passive range of motion   PT Next Visit Plan Communicate with MPT regarding last treatment.    Consulted and Agree with Plan of Care Patient     PHYSICAL THERAPY DISCHARGE SUMMARY  Visits from Start of Care: 12  Current functional level related to goals / functional outcomes: Please see above.   Remaining deficits: Continued pain and numbness of right thumb.   Education / Equipment: HEP. Plan: Patient agrees to discharge.  Patient goals were not met. Patient is being discharged due to meeting the stated rehab goals.  ?????       Problem List There are no active problems to display for this patient.   APPLEGATE, [place] MPT 11/24/2014, 5:02 PM  [facility] [address] [place], [place], [address] Phone: [phone]   Fax:  [phone]

## 2014-11-24 NOTE — Therapy (Signed)
[facility] [place], [place], [address] Phone: [phone]   Fax:  [phone]  Physical Therapy Treatment  Patient Details  Name: Jaime Massey MRN: [id number] Date of Birth: Jul 12, 1975 Referring Provider:  Deloria Lair., MD  Encounter Date: 11/24/2014      PT End of Session - 11/24/14 1600    Visit Number 12   Number of Visits 12   Date for PT Re-Evaluation 11/29/14   PT Start Time 1600   PT Stop Time 1655   PT Time Calculation (min) 55 min   Activity Tolerance Patient tolerated treatment well   Behavior During Therapy [facility] for tasks assessed/performed      Past Medical History  Diagnosis Date  . DVT (deep venous thrombosis)   . PE (pulmonary embolism)   . Seizures     as child from severe ear infections- no meds now and no seizures since age 72  . Arthritis     Past Surgical History  Procedure Laterality Date  . Cesarean section    . Cholecystectomy N/A 12/21/2012    Procedure: LAPAROSCOPIC CHOLECYSTECTOMY;  Surgeon: Donato Heinz, MD;  Location: [facility];  Service: General;  Laterality: N/A;    There were no vitals filed for this visit.  Visit Diagnosis:  Thumb pain, right      Subjective Assessment - 11/24/14 1623    Subjective States that she tried to get in touch with her MD and was on hold for a prolonged period of time. Thinks that new brace and possibly [place] is helping. Not swelling witth new brace.   Pertinent History Previous right hand injury which patient said essentially resolved and was nothing like it is now.   Patient Stated Goals Get out of pain.   Currently in Pain? No/denies            [facility] PT Assessment - 11/24/14 0001    Assessment   Medical Diagnosis Contusion of right hand; Right wrist pain.   Onset Date 07/17/14   Strength   Overall Strength Comments Dynamometer L wrist=approx 84 lbs, R wrist= approx. 61 lbs                      [facility] Adult PT  Treatment/Exercise - 11/24/14 0001    Exercises   Exercises Hand   Hand Exercises   Digiticizer x9mn   Other Hand Exercises Red web grip x3 min and thumb opposition x363m, ball squeeze x3m67m R wrist pronation/supination 2# x30 reps    Other Hand Exercises  Towel drag 4# x30 reps, wrist ext/flex 2# x30 reps each, Radial deviate with hammer x30, towel squeeze x3mi51m Modalities   Modalities Electrical Stimulation;Ultrasound   Electrical Stimulation   Electrical Stimulation Location Lateral thumb and forearm   Electrical Stimulation Action Pre-Mod   Electrical Stimulation Parameters 80-150 Hz x15mi22mElectrical Stimulation Goals Pain   Ultrasound   Ultrasound Location Base of R thumb and into radial aspect of R wrist   Ultrasound Parameters 1 w/cm2, 50% x8min 41mltrasound Goals Pain;Other (Comment)  Numbness                     PT Long Term Goals - 11/24/14 1600    PT LONG TERM GOAL #1   Title Ind with HEP.   Time 6   Period Weeks   Status Achieved   PT LONG TERM GOAL #2   Title Perform ADL's  with pain not > 1-2/10   Time 6   Period Weeks   Status Achieved   PT LONG TERM GOAL #3   Title Return to full duty work.   Time 6   Period Weeks   Status Not Met   PT LONG TERM GOAL #4   Title No complaints of right thumb numbness/   Time 6   Period Weeks   Status Not Met               Plan - 11/24/14 1629    Clinical Impression Statement Patient tolerated treatment well without complaint of pain during exercises. Patient has not returned to full work duty at this time and still has R thumb numbness with some activities meaning the two remaining goals were not achieved at this time. Required minimal verbal cueing and demonstration to correct technique of some exercises. Normal modalities response noted following removal of the modalties. Experienced tingling/numb sensation following therapy but denied pain.   Pt will benefit from skilled therapeutic  intervention in order to improve on the following deficits Pain;Decreased activity tolerance   Rehab Potential Excellent   PT Duration 2 weeks   PT Treatment/Interventions ADLs/Self Care Home Management;Moist Heat;Therapeutic activities;Patient/family education;Therapeutic exercise;Ultrasound;Manual techniques;Cryotherapy;Electrical Stimulation;Passive range of motion   PT Next Visit Plan Communicate with MPT regarding last treatment.    Consulted and Agree with Plan of Care Patient        Problem List There are no active problems to display for this patient.   Ahmed Prima, PTA 11/24/2014 4:58 PM    [facility] [address] [place], [place], [address] Phone: [phone]   Fax:  [phone]

## 2015-02-04 ENCOUNTER — Encounter (HOSPITAL_COMMUNITY): Payer: Self-pay | Admitting: Emergency Medicine

## 2015-02-04 ENCOUNTER — Emergency Department (HOSPITAL_COMMUNITY)
Admission: EM | Admit: 2015-02-04 | Discharge: 2015-02-04 | Disposition: A | Discharge status: Home / Self Care | Payer: 59 | Attending: Emergency Medicine | Admitting: Emergency Medicine

## 2015-02-04 DIAGNOSIS — Z7952 Long term (current) use of systemic steroids: Secondary | ICD-10-CM | POA: Insufficient documentation

## 2015-02-04 DIAGNOSIS — M1990 Unspecified osteoarthritis, unspecified site: Secondary | ICD-10-CM | POA: Diagnosis not present

## 2015-02-04 DIAGNOSIS — Z86711 Personal history of pulmonary embolism: Secondary | ICD-10-CM | POA: Insufficient documentation

## 2015-02-04 DIAGNOSIS — Z880 Allergy status to penicillin: Secondary | ICD-10-CM | POA: Diagnosis not present

## 2015-02-04 DIAGNOSIS — Z720 Tobacco use: Secondary | ICD-10-CM | POA: Insufficient documentation

## 2015-02-04 DIAGNOSIS — Z86718 Personal history of other venous thrombosis and embolism: Secondary | ICD-10-CM | POA: Insufficient documentation

## 2015-02-04 DIAGNOSIS — M79671 Pain in right foot: Secondary | ICD-10-CM | POA: Diagnosis present

## 2015-02-04 DIAGNOSIS — M79672 Pain in left foot: Secondary | ICD-10-CM | POA: Insufficient documentation

## 2015-02-04 DIAGNOSIS — M722 Plantar fascial fibromatosis: Secondary | ICD-10-CM | POA: Diagnosis not present

## 2015-02-04 MED ORDER — NAPROXEN 500 MG PO TABS: 500.0000 mg | ORAL_TABLET | Freq: Two times a day (BID) | ORAL | Status: DC

## 2015-02-04 NOTE — ED Provider Notes (Signed)
CSN: [id number]     Arrival date & time 02/04/15  1517 History   First MD Initiated Contact with Patient 02/04/15 [phone]     Chief Complaint  Patient presents with  . Foot Pain     (Consider location/radiation/quality/duration/timing/severity/associated sxs/prior Treatment) Patient is a 39 y.o. female presenting with lower extremity pain. The history is provided by the patient and medical records.  Foot Pain Associated symptoms include arthralgias.   39 year old female with history of DVT, PE, seizures, arthritis, presenting to the ED for bilateral foot pain for the past several months. Patient states pain is most intense upon waking, improves throughout the day when walking. States pain becomes intense again when she sits down and attempts to get up again. She denies numbness or weakness of her feet. No known injuries, trauma, or falls.  No intervention tried PTA.  Patient has been seen by orthopedics in the past, Drs Gladstone Lighter and Amedeo Plenty of [facility] orthopedics.  No intervention tried PTA.  Past Medical History  Diagnosis Date  . DVT (deep venous thrombosis)   . PE (pulmonary embolism)   . Seizures     as child from severe ear infections- no meds now and no seizures since age 25  . Arthritis    Past Surgical History  Procedure Laterality Date  . Cesarean section    . Cholecystectomy N/A 12/21/2012    Procedure: LAPAROSCOPIC CHOLECYSTECTOMY;  Surgeon: Donato Heinz, MD;  Location: [facility];  Service: General;  Laterality: N/A;   History reviewed. No pertinent family history. History  Substance Use Topics  . Smoking status: Current Every Day Smoker -- 0.50 packs/day for 20 years    Types: Cigarettes  . Smokeless tobacco: Never Used  . Alcohol Use: No   OB History    Gravida Para Term Preterm AB TAB SAB Ectopic Multiple Living   4 4 4       4      Review of Systems  Musculoskeletal: Positive for arthralgias.  All other systems reviewed and are negative.     Allergies   Penicillins  Home Medications   Prior to Admission medications   Medication Sig Start Date End Date Taking? Authorizing Provider  Naproxen Sodium 220 MG CAPS Take 440 mg by mouth 2 (two) times daily as needed (Pain).   Yes Historical Provider, MD  HYDROcodone-acetaminophen (NORCO/VICODIN) 5-325 MG per tablet Take 1 tablet by mouth every 4 (four) hours as needed. 07/17/14   Evalee Jefferson, PA-C  meloxicam (MOBIC) 15 MG tablet Take 1 tablet (15 mg total) by mouth daily. Patient not taking: Reported on 06/25/2014 01/04/14   Lily Kocher, PA-C  naproxen (NAPROSYN) 500 MG tablet Take 1 tablet (500 mg total) by mouth 2 (two) times daily. 07/17/14   Evalee Jefferson, PA-C  predniSONE (DELTASONE) 20 MG tablet Take 3 tablets (60 mg total) by mouth daily. 06/25/14   Kristen N Ward, DO   BP 124/70 mmHg  Pulse 95  Temp(Src) 98.3 F (36.8 C) (Oral)  Resp 18  Ht 5' 9"  (1.753 m)  Wt 213 lb (96.616 kg)  BMI 31.44 kg/m2  SpO2 100%    Physical Exam  Constitutional: She is oriented to person, place, and time. She appears well-developed and well-nourished.  HENT:  Head: Normocephalic and atraumatic.  Mouth/Throat: Oropharynx is clear and moist.  Eyes: Conjunctivae and EOM are normal. Pupils are equal, round, and reactive to light.  Neck: Normal range of motion. Neck supple.  Cardiovascular: Normal rate, regular rhythm and normal heart  sounds.   Pulmonary/Chest: Effort normal and breath sounds normal. No respiratory distress. She has no wheezes.  Musculoskeletal: Normal range of motion.  Bilateral feet normal in appearance; tenderness along the tendon fascia of soles of bilateral feet without acute deformity; full ROM of ankles, feet, and all toes; both feet at [facility] No calf asymmetry, tenderness, or palpable cords; no overlying skin changes  Neurological: She is alert and oriented to person, place, and time.  Skin: Skin is warm and dry.  Psychiatric: She has a normal mood and affect.  Nursing note and vitals  reviewed.   ED Course  Procedures (including critical care time) Labs Review Labs Reviewed - No data to display  Imaging Review No results found.   EKG Interpretation None      MDM   Final diagnoses:  Plantar fasciitis, bilateral   39 year old female with bilateral foot pain for several months. Symptoms and physical exam findings are consistent with plantar fasciitis.  No clinical signs of DVT. I've encouraged stretching and anti-inflammatories. She is established patient of [facility] whom she will follow-up with.  Discussed plan with patient, he/she acknowledged understanding and agreed with plan of care.  Return precautions given for new or worsening symptoms.  Larene Pickett, PA-C 02/04/15 [place], MD 02/04/15 [phone]

## 2015-02-04 NOTE — ED Notes (Signed)
Pt states that both of her feet have been hurting for several months.

## 2015-02-04 NOTE — Discharge Instructions (Signed)
Take the prescribed medication as directed. See attached documents for stretching exercises like we discussed. Follow-up with [facility]-- call to make appt if needed. Return to the ED for new or worsening symptoms.

## 2016-01-15 DIAGNOSIS — B349 Viral infection, unspecified: Secondary | ICD-10-CM | POA: Diagnosis not present

## 2016-01-15 DIAGNOSIS — F17200 Nicotine dependence, unspecified, uncomplicated: Secondary | ICD-10-CM | POA: Diagnosis not present

## 2016-04-16 DIAGNOSIS — M79671 Pain in right foot: Secondary | ICD-10-CM | POA: Diagnosis not present

## 2016-04-16 DIAGNOSIS — M722 Plantar fascial fibromatosis: Secondary | ICD-10-CM | POA: Diagnosis not present

## 2016-04-18 DIAGNOSIS — H5213 Myopia, bilateral: Secondary | ICD-10-CM | POA: Diagnosis not present

## 2016-05-14 DIAGNOSIS — M722 Plantar fascial fibromatosis: Secondary | ICD-10-CM | POA: Diagnosis not present

## 2016-06-16 ENCOUNTER — Encounter (HOSPITAL_COMMUNITY): Payer: Self-pay | Admitting: Emergency Medicine

## 2016-06-16 ENCOUNTER — Emergency Department (HOSPITAL_COMMUNITY)
Admission: EM | Admit: 2016-06-16 | Discharge: 2016-06-16 | Disposition: A | Discharge status: Home / Self Care | Payer: PRIVATE HEALTH INSURANCE | Attending: Emergency Medicine | Admitting: Emergency Medicine

## 2016-06-16 ENCOUNTER — Emergency Department (HOSPITAL_COMMUNITY): Payer: PRIVATE HEALTH INSURANCE

## 2016-06-16 DIAGNOSIS — F17210 Nicotine dependence, cigarettes, uncomplicated: Secondary | ICD-10-CM | POA: Insufficient documentation

## 2016-06-16 DIAGNOSIS — S60012A Contusion of left thumb without damage to nail, initial encounter: Secondary | ICD-10-CM | POA: Diagnosis not present

## 2016-06-16 DIAGNOSIS — Y929 Unspecified place or not applicable: Secondary | ICD-10-CM | POA: Insufficient documentation

## 2016-06-16 DIAGNOSIS — Y9389 Activity, other specified: Secondary | ICD-10-CM | POA: Insufficient documentation

## 2016-06-16 DIAGNOSIS — W230XXA Caught, crushed, jammed, or pinched between moving objects, initial encounter: Secondary | ICD-10-CM | POA: Diagnosis not present

## 2016-06-16 DIAGNOSIS — Y990 Civilian activity done for income or pay: Secondary | ICD-10-CM | POA: Diagnosis not present

## 2016-06-16 DIAGNOSIS — Z79899 Other long term (current) drug therapy: Secondary | ICD-10-CM | POA: Diagnosis not present

## 2016-06-16 DIAGNOSIS — S6992XA Unspecified injury of left wrist, hand and finger(s), initial encounter: Secondary | ICD-10-CM | POA: Diagnosis present

## 2016-06-16 MED ORDER — ACETAMINOPHEN 325 MG PO TABS
650.0000 mg | ORAL_TABLET | Freq: Once | ORAL | Status: AC
  Administered 2016-06-16: 650 mg via ORAL
  Filled 2016-06-16: qty 2

## 2016-06-16 MED ORDER — IBUPROFEN 800 MG PO TABS
800.0000 mg | ORAL_TABLET | Freq: Once | ORAL | Status: AC
  Administered 2016-06-16: 800 mg via ORAL
  Filled 2016-06-16: qty 1

## 2016-06-16 NOTE — Discharge Instructions (Signed)
The x-ray of your left hand is negative for fracture or dislocation. The examination is negative for a hematoma under the nail. Thumb at this time. You do have a lot of bruising however to the left thumb. Please use the Ace wrap and ice pack. Please keep your hand elevated above your heart is much as possible. Use 600 mg of ibuprofen and/or 500 mg of Tylenol every 6 hours as needed for soreness and discomfort. Please see Dr. Scotty Court, or return to the emergency department if not improving.

## 2016-06-16 NOTE — ED Triage Notes (Signed)
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 Left thumb purple on exam with swelling noted.

## 2016-06-16 NOTE — ED Provider Notes (Signed)
[facility] DEPT Provider Note   CSN: [id number] Arrival date & time: 06/16/16  1234  By signing my name below, I, Jaime Massey, attest that this documentation has been prepared under the direction and in the presence of Jaime Massey, [place]. Electronically Signed: Neta Massey, ED Scribe. 06/16/2016. 1:26 PM.    History   Chief Complaint Chief Complaint  Patient presents with  . Hand Injury   The history is provided by the patient. No language interpreter was used.   HPI Comments:  Jaime Massey is a 40 y.o. female who presents to the Emergency Department s/p left hand injury that occurred at 0830 today. 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 Pt notes swelling and a color change to her left thumb. Pt states that her entire thumb is in pain, rates the pain at 5/10, and describes the pain as "aching." No alleviating factors noted. Pt denies other associated symptoms.   Past Medical History:  Diagnosis Date  . Arthritis   . DVT (deep venous thrombosis) ([place])   . PE (pulmonary embolism)   . Seizures ([place])    as child from severe ear infections- no meds now and no seizures since age 16    There are no active problems to display for this patient.   Past Surgical History:  Procedure Laterality Date  . CESAREAN SECTION    . CHOLECYSTECTOMY N/A 12/21/2012   Procedure: LAPAROSCOPIC CHOLECYSTECTOMY;  Surgeon: Donato Heinz, MD;  Location: [facility];  Service: General;  Laterality: N/A;    OB History    Gravida Para Term Preterm AB Living   4 4 4     4    SAB TAB Ectopic Multiple Live Births                   Home Medications    Prior to Admission medications   Medication Sig Start Date End Date Taking? Authorizing Provider  HYDROcodone-acetaminophen (NORCO/VICODIN) 5-325 MG per tablet Take 1 tablet by mouth every 4 (four) hours as needed. 07/17/14   Evalee Jefferson, PA-C  meloxicam (MOBIC) 15 MG tablet  Take 1 tablet (15 mg total) by mouth daily. Patient not taking: Reported on 06/25/2014 01/04/14   Jaime Kocher, PA-C  naproxen (NAPROSYN) 500 MG tablet Take 1 tablet (500 mg total) by mouth 2 (two) times daily with a meal. 02/04/15   Larene Pickett, PA-C  Naproxen Sodium 220 MG CAPS Take 440 mg by mouth 2 (two) times daily as needed (Pain).    Historical Provider, MD  predniSONE (DELTASONE) 20 MG tablet Take 3 tablets (60 mg total) by mouth daily. 06/25/14   [place], DO    Family History History reviewed. No pertinent family history.  Social History Social History  Substance Use Topics  . Smoking status: Current Every Day Smoker    Packs/day: 0.50    Years: 20.00    Types: Cigarettes  . Smokeless tobacco: Never Used  . Alcohol use No     Allergies   Penicillins   Review of Systems Review of Systems  Musculoskeletal: Positive for arthralgias and joint swelling.  Neurological: Negative for numbness.  All other systems reviewed and are negative.    Physical Exam Updated Vital Signs BP 117/67 (BP Location: Right Arm)   Pulse 90   Temp 97.7 F (36.5 C) (Oral)   Resp 18   Ht 5' 9"  (1.753 m)  Wt 207 lb (93.9 kg)   SpO2 100%   BMI 30.57 kg/m   Physical Exam  Constitutional: She appears well-developed and well-nourished. No distress.  HENT:  Head: Normocephalic and atraumatic.  Eyes: Conjunctivae are normal.  Cardiovascular: Normal rate.   Pulmonary/Chest: Effort normal.  Abdominal: She exhibits no distension.  Musculoskeletal: Normal range of motion.  Bruise between DIP and MP joint of left thumb. No pain at anatomical snuff box. Pain at DIP joint. Do deformity of left wrist. Full ROM of left wrist, elbow, shoulder.   Neurological: She is alert.  Skin: Skin is warm and dry. Capillary refill takes less than 2 seconds.  Psychiatric: She has a normal mood and affect.  Nursing note and vitals reviewed.    ED Treatments / Results  DIAGNOSTIC  STUDIES:  Oxygen Saturation is 100% on RA, normal by my interpretation.    COORDINATION OF CARE:  1:26 PM Will wrap with ace bandage, and pt was instructed to continue using ice. Discussed treatment plan with pt at bedside and pt agreed to plan.   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 EXAM: LEFT HAND - COMPLETE 3+ VIEW COMPARISON:  None. FINDINGS: No fracture or dislocation with special attention paid to the thumb. Joint spaces are preserved. No erosions. No evidence chondrocalcinosis. Regional soft tissues appear normal. No radiopaque foreign body. IMPRESSION: Normal radiographs of the left hand. Electronically Signed   By: Sandi Mariscal M.D.   On: 06/16/2016 13:10    Procedures Procedures (including critical care time)  Medications Ordered in ED Medications - No data to display   Initial Impression / Assessment and Plan / ED Course  I have reviewed the triage vital signs and the nursing notes.  Pertinent labs & imaging results that were available during my care of the patient were reviewed by me and considered in my medical decision making (see chart for details).  Clinical Course     *I have reviewed nursing notes, vital signs, and all appropriate lab and imaging results for this patient.**  Final Clinical Impressions(s) / ED Diagnoses  Vital signs within normal limits. X-ray of the left hand is negative for fracture or dislocation. Particular the left is negative. Patient has some bruising of the thumb. Patient was placed in an ace wrap. Patient is to follow-up with orthopedics if not improving. Patient will use Tylenol and ibuprofen for soreness if needed.    Final diagnoses:  Contusion of left thumb without damage to nail, initial  encounter    New Prescriptions New Prescriptions   No medications on file  **I personally performed the services described in this documentation, which was scribed in my presence. The recorded information has been reviewed and is accurate.Jaime Kocher, PA-C 06/18/16 1825    Virgel Manifold, MD 06/18/16 [phone]

## 2016-08-09 ENCOUNTER — Emergency Department (HOSPITAL_COMMUNITY): Payer: PRIVATE HEALTH INSURANCE

## 2016-08-09 ENCOUNTER — Encounter (HOSPITAL_COMMUNITY): Payer: Self-pay

## 2016-08-09 ENCOUNTER — Emergency Department (HOSPITAL_COMMUNITY)
Admission: EM | Admit: 2016-08-09 | Discharge: 2016-08-09 | Disposition: A | Discharge status: Home / Self Care | Payer: PRIVATE HEALTH INSURANCE | Attending: Emergency Medicine | Admitting: Emergency Medicine

## 2016-08-09 DIAGNOSIS — S59902A Unspecified injury of left elbow, initial encounter: Secondary | ICD-10-CM | POA: Diagnosis present

## 2016-08-09 DIAGNOSIS — S46912A Strain of unspecified muscle, fascia and tendon at shoulder and upper arm level, left arm, initial encounter: Secondary | ICD-10-CM

## 2016-08-09 DIAGNOSIS — Y93F2 Activity, caregiving, lifting: Secondary | ICD-10-CM | POA: Insufficient documentation

## 2016-08-09 DIAGNOSIS — Y990 Civilian activity done for income or pay: Secondary | ICD-10-CM | POA: Insufficient documentation

## 2016-08-09 DIAGNOSIS — S53402A Unspecified sprain of left elbow, initial encounter: Secondary | ICD-10-CM | POA: Insufficient documentation

## 2016-08-09 DIAGNOSIS — Y92129 Unspecified place in nursing home as the place of occurrence of the external cause: Secondary | ICD-10-CM | POA: Insufficient documentation

## 2016-08-09 DIAGNOSIS — Z79899 Other long term (current) drug therapy: Secondary | ICD-10-CM | POA: Diagnosis not present

## 2016-08-09 DIAGNOSIS — M779 Enthesopathy, unspecified: Secondary | ICD-10-CM | POA: Diagnosis not present

## 2016-08-09 DIAGNOSIS — F17210 Nicotine dependence, cigarettes, uncomplicated: Secondary | ICD-10-CM | POA: Diagnosis not present

## 2016-08-09 DIAGNOSIS — X500XXA Overexertion from strenuous movement or load, initial encounter: Secondary | ICD-10-CM | POA: Diagnosis not present

## 2016-08-09 DIAGNOSIS — Z791 Long term (current) use of non-steroidal anti-inflammatories (NSAID): Secondary | ICD-10-CM | POA: Diagnosis not present

## 2016-08-09 MED ORDER — ONDANSETRON HCL 4 MG PO TABS
4.0000 mg | ORAL_TABLET | Freq: Once | ORAL | Status: AC
  Administered 2016-08-09: 4 mg via ORAL
  Filled 2016-08-09: qty 1

## 2016-08-09 MED ORDER — TRAMADOL HCL 50 MG PO TABS: ORAL_TABLET | ORAL | 0 refills | Status: DC

## 2016-08-09 MED ORDER — DEXAMETHASONE 4 MG PO TABS: 4.0000 mg | ORAL_TABLET | Freq: Two times a day (BID) | ORAL | 0 refills | Status: DC

## 2016-08-09 MED ORDER — DICLOFENAC SODIUM 75 MG PO TBEC: 75.0000 mg | DELAYED_RELEASE_TABLET | Freq: Two times a day (BID) | ORAL | 0 refills | Status: DC

## 2016-08-09 MED ORDER — KETOROLAC TROMETHAMINE 10 MG PO TABS
10.0000 mg | ORAL_TABLET | Freq: Once | ORAL | Status: AC
  Administered 2016-08-09: 10 mg via ORAL
  Filled 2016-08-09: qty 1

## 2016-08-09 MED ORDER — PREDNISONE 20 MG PO TABS
40.0000 mg | ORAL_TABLET | Freq: Once | ORAL | Status: AC
  Administered 2016-08-09: 40 mg via ORAL
  Filled 2016-08-09: qty 2

## 2016-08-09 NOTE — ED Triage Notes (Signed)
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 Having difficulty lifting now. Pain shoots up left arm. And painful to grip left hand

## 2016-08-09 NOTE — ED Notes (Signed)
Pt returned from xray

## 2016-08-09 NOTE — Discharge Instructions (Signed)
Your x-ray is negative for fracture or dislocation. Your examination suggest strain/sprain of the elbow, accompanied by tendinitis. Please use the Ace wrap and the sling over the next 3 days. Please rest her arm over the next 3 days. Use Decadron and diclofenac 2 times daily with food. May use Ultram for pain if needed.This medication may cause drowsiness. Please do not drink, drive, or participate in activity that requires concentration while taking this medication.

## 2016-08-14 NOTE — ED Provider Notes (Signed)
[facility] DEPT Provider Note   CSN: [id number] Arrival date & time: 08/09/16  1632     History   Chief Complaint Chief Complaint  Patient presents with  . Arm Pain    HPI Jaime Massey is a 41 y.o. female.  Patient is a 41 year old female who presents to the emergency department with a complaint of left elbow area pain.  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 She states that she has had pain in the left upper extremity particularly in the temporal area since that time. When she flexes and extends the elbow area and at times when she grips using the left hand she has pain that shoots from the elbow area into the wrist area and vice versa. She does not recall any other injury or trauma. She has not had any operations or procedures involving the left upper extremity. She does not recall having had this problem in the past.   The history is provided by the patient.  Arm Injury   This is a new problem.    Past Medical History:  Diagnosis Date  . Arthritis   . DVT (deep venous thrombosis) ([place])   . PE (pulmonary embolism)   . Seizures ([place])    as child from severe ear infections- no meds now and no seizures since age 16    There are no active problems to display for this patient.   Past Surgical History:  Procedure Laterality Date  . CESAREAN SECTION    . CHOLECYSTECTOMY N/A 12/21/2012   Procedure: LAPAROSCOPIC CHOLECYSTECTOMY;  Surgeon: Donato Heinz, MD;  Location: [facility];  Service: General;  Laterality: N/A;    OB History    Gravida Para Term Preterm AB Living   4 4 4     4    SAB TAB Ectopic Multiple Live Births                   Home Medications    Prior to Admission medications   Medication Sig Start Date End Date Taking? Authorizing Provider  dexamethasone (DECADRON) 4 MG tablet Take 1 tablet (4 mg total) by mouth 2 (two) times daily with a meal. 08/09/16    Lily Kocher, PA-C  diclofenac (VOLTAREN) 75 MG EC tablet Take 1 tablet (75 mg total) by mouth 2 (two) times daily. 08/09/16   Lily Kocher, PA-C  HYDROcodone-acetaminophen (NORCO/VICODIN) 5-325 MG per tablet Take 1 tablet by mouth every 4 (four) hours as needed. 07/17/14   Evalee Jefferson, PA-C  meloxicam (MOBIC) 15 MG tablet Take 1 tablet (15 mg total) by mouth daily. Patient not taking: Reported on 06/25/2014 01/04/14   Lily Kocher, PA-C  naproxen (NAPROSYN) 500 MG tablet Take 1 tablet (500 mg total) by mouth 2 (two) times daily with a meal. 02/04/15   Larene Pickett, PA-C  Naproxen Sodium 220 MG CAPS Take 440 mg by mouth 2 (two) times daily as needed (Pain).    Historical Provider, MD  predniSONE (DELTASONE) 20 MG tablet Take 3 tablets (60 mg total) by mouth daily. 06/25/14   Delice Bison Ward, DO  traMADol (ULTRAM) 50 MG tablet 1 or 2 po q6h prn. 08/09/16   Lily Kocher, PA-C    Family History No family history on file.  Social History Social History  Substance Use Topics  . Smoking status: Current Every Day Smoker    Packs/day:  0.50    Years: 20.00    Types: Cigarettes  . Smokeless tobacco: Never Used  . Alcohol use No     Allergies   Penicillins   Review of Systems Review of Systems  Constitutional: Negative for activity change.       All ROS Neg except as noted in HPI  HENT: Negative for nosebleeds.   Eyes: Negative for photophobia and discharge.  Respiratory: Negative for cough, shortness of breath and wheezing.   Cardiovascular: Negative for chest pain and palpitations.  Gastrointestinal: Negative for abdominal pain and blood in stool.  Genitourinary: Negative for dysuria, frequency and hematuria.  Musculoskeletal: Positive for arthralgias. Negative for back pain and neck pain.  Skin: Negative.   Neurological: Negative for dizziness, seizures and speech difficulty.  Psychiatric/Behavioral: Negative for confusion and hallucinations.     Physical Exam Updated Vital  Signs BP (!) 114/53 (BP Location: Right Arm)   Pulse 78   Temp 98.9 F (37.2 C) (Oral)   Resp 17   Ht 5' 9"  (1.753 m)   Wt 97.1 kg   SpO2 100%   BMI 31.60 kg/m   Physical Exam  Constitutional: She is oriented to person, place, and time. She appears well-developed and well-nourished.  Non-toxic appearance.  HENT:  Head: Normocephalic.  Right Ear: Tympanic membrane and external ear normal.  Left Ear: Tympanic membrane and external ear normal.  Eyes: EOM and lids are normal. Pupils are equal, round, and reactive to light.  Neck: Normal range of motion. Neck supple. Carotid bruit is not present.  Cardiovascular: Normal rate, regular rhythm, normal heart sounds, intact distal pulses and normal pulses.   Pulmonary/Chest: Breath sounds normal. No respiratory distress.  Abdominal: Soft. Bowel sounds are normal. There is no tenderness. There is no guarding.  Musculoskeletal:       Left elbow: She exhibits decreased range of motion. She exhibits no swelling, no effusion and no deformity. Tenderness found.  Lymphadenopathy:       Head (right side): No submandibular adenopathy present.       Head (left side): No submandibular adenopathy present.    She has no cervical adenopathy.  Neurological: She is alert and oriented to person, place, and time. She has normal strength. No cranial nerve deficit or sensory deficit.  No motor or sensory deficit of the  Right or left upper extremity.  Skin: Skin is warm and dry.  Psychiatric: She has a normal mood and affect. Her speech is normal.  Nursing note and vitals reviewed.    ED Treatments / Results  Labs (all labs ordered are listed, but only abnormal results are displayed) Labs Reviewed - No data to display  EKG  EKG Interpretation None       Radiology No results found.  Procedures Procedures (including critical care time)  Medications Ordered in ED Medications  ketorolac (TORADOL) tablet 10 mg (10 mg Oral Given 08/09/16 1845)    predniSONE (DELTASONE) tablet 40 mg (40 mg Oral Given 08/09/16 1845)  ondansetron (ZOFRAN) tablet 4 mg (4 mg Oral Given 08/09/16 1845)     Initial Impression / Assessment and Plan / ED Course  I have reviewed the triage vital signs and the nursing notes.  Pertinent labs & imaging results that were available during my care of the patient were reviewed by me and considered in my medical decision making (see chart for details).     **I have reviewed nursing notes, vital signs, and all appropriate lab and imaging results for  this patient.*  Final Clinical Impressions(s) / ED Diagnoses   MDM X-ray of the left elbow is negative for fracture or dislocation. Vital signs within normal limits. There is no evidence of a hot joint. There's no deformity appreciated. Movements of pronation and flexion and grip cause increase in pain. I suspect the patient has tendinitis. Patient will be treated with anti-inflammatory medications steroid medication. Patient referred to orthopedics for additional evaluation and management. Patient placed in a sling to assist with discomfort.    Final diagnoses:  Tendonitis  Elbow strain, left, initial encounter    New Prescriptions Discharge Medication List as of 08/09/2016  6:34 PM    START taking these medications   Details  dexamethasone (DECADRON) 4 MG tablet Take 1 tablet (4 mg total) by mouth 2 (two) times daily with a meal., Starting Fri 08/09/2016, Print    diclofenac (VOLTAREN) 75 MG EC tablet Take 1 tablet (75 mg total) by mouth 2 (two) times daily., Starting Fri 08/09/2016, Print    traMADol (ULTRAM) 50 MG tablet 1 or 2 po q6h prn., Print         Lily Kocher, PA-C 08/14/16 1041    Julianne Rice, MD 08/20/16 1555

## 2016-08-26 DIAGNOSIS — R509 Fever, unspecified: Secondary | ICD-10-CM | POA: Diagnosis not present

## 2016-08-26 DIAGNOSIS — G4452 New daily persistent headache (NDPH): Secondary | ICD-10-CM | POA: Diagnosis not present

## 2016-08-28 DIAGNOSIS — R509 Fever, unspecified: Secondary | ICD-10-CM | POA: Diagnosis not present

## 2016-08-28 DIAGNOSIS — R1110 Vomiting, unspecified: Secondary | ICD-10-CM | POA: Diagnosis not present

## 2016-08-30 ENCOUNTER — Emergency Department (HOSPITAL_COMMUNITY): Payer: 59

## 2016-08-30 ENCOUNTER — Encounter (HOSPITAL_COMMUNITY): Payer: Self-pay | Admitting: Emergency Medicine

## 2016-08-30 ENCOUNTER — Emergency Department (HOSPITAL_COMMUNITY)
Admission: EM | Admit: 2016-08-30 | Discharge: 2016-08-30 | Disposition: A | Discharge status: Home / Self Care | Payer: 59 | Attending: Emergency Medicine | Admitting: Emergency Medicine

## 2016-08-30 DIAGNOSIS — D72825 Bandemia: Secondary | ICD-10-CM | POA: Insufficient documentation

## 2016-08-30 DIAGNOSIS — R51 Headache: Secondary | ICD-10-CM | POA: Insufficient documentation

## 2016-08-30 DIAGNOSIS — D696 Thrombocytopenia, unspecified: Secondary | ICD-10-CM | POA: Insufficient documentation

## 2016-08-30 DIAGNOSIS — G8929 Other chronic pain: Secondary | ICD-10-CM | POA: Insufficient documentation

## 2016-08-30 DIAGNOSIS — R509 Fever, unspecified: Secondary | ICD-10-CM | POA: Insufficient documentation

## 2016-08-30 DIAGNOSIS — R799 Abnormal finding of blood chemistry, unspecified: Secondary | ICD-10-CM | POA: Diagnosis present

## 2016-08-30 DIAGNOSIS — F17210 Nicotine dependence, cigarettes, uncomplicated: Secondary | ICD-10-CM | POA: Diagnosis not present

## 2016-08-30 LAB — CBC WITH DIFFERENTIAL/PLATELET
Band Neutrophils: 0 %
Basophils Absolute: 0 10*3/uL (ref 0.0–0.1)
Basophils Relative: 0 %
Blasts: 0 %
Eosinophils Absolute: 0.2 10*3/uL (ref 0.0–0.7)
Eosinophils Relative: 3 %
HCT: 37.8 % (ref 36.0–46.0)
Hemoglobin: 13.4 g/dL (ref 12.0–15.0)
Lymphocytes Relative: 38 %
Lymphs Abs: 2.1 10*3/uL (ref 0.7–4.0)
MCH: 32.6 pg (ref 26.0–34.0)
MCHC: 35.4 g/dL (ref 30.0–36.0)
MCV: 92 fL (ref 78.0–100.0)
Metamyelocytes Relative: 0 %
Monocytes Absolute: 0.1 10*3/uL (ref 0.1–1.0)
Monocytes Relative: 2 %
Myelocytes: 0 %
Neutro Abs: 3 10*3/uL (ref 1.7–7.7)
Neutrophils Relative %: 57 %
Other: 0 %
Platelets: 77 10*3/uL — ABNORMAL LOW (ref 150–400)
Promyelocytes Absolute: 0 %
RBC: 4.11 MIL/uL (ref 3.87–5.11)
RDW: 12 % (ref 11.5–15.5)
WBC Morphology: INCREASED
WBC: 5.4 10*3/uL (ref 4.0–10.5)
nRBC: 0 /100 WBC

## 2016-08-30 LAB — URINALYSIS, ROUTINE W REFLEX MICROSCOPIC
Bilirubin Urine: NEGATIVE
Glucose, UA: NEGATIVE mg/dL
Ketones, ur: NEGATIVE mg/dL
Leukocytes, UA: NEGATIVE
Nitrite: NEGATIVE
Protein, ur: NEGATIVE mg/dL
Specific Gravity, Urine: 1.008 (ref 1.005–1.030)
pH: 7 (ref 5.0–8.0)

## 2016-08-30 LAB — COMPREHENSIVE METABOLIC PANEL
ALT: 50 U/L (ref 14–54)
AST: 42 U/L — ABNORMAL HIGH (ref 15–41)
Albumin: 3.3 g/dL — ABNORMAL LOW (ref 3.5–5.0)
Alkaline Phosphatase: 61 U/L (ref 38–126)
Anion gap: 9 (ref 5–15)
BUN: <5 mg/dL — ABNORMAL LOW (ref 6–20)
CO2: 22 mmol/L (ref 22–32)
Calcium: 8.4 mg/dL — ABNORMAL LOW (ref 8.9–10.3)
Chloride: 110 mmol/L (ref 101–111)
Creatinine, Ser: 0.68 mg/dL (ref 0.44–1.00)
GFR calc Af Amer: >60 mL/min (ref >60)
GFR calc non Af Amer: >60 mL/min (ref >60)
Glucose, Bld: 91 mg/dL (ref 65–99)
Potassium: 3.4 mmol/L — ABNORMAL LOW (ref 3.5–5.1)
Sodium: 141 mmol/L (ref 135–145)
Total Bilirubin: 0.6 mg/dL (ref 0.3–1.2)
Total Protein: 6 g/dL — ABNORMAL LOW (ref 6.5–8.1)

## 2016-08-30 LAB — RAPID HIV SCREEN (HIV 1/2 AB+AG)
HIV 1/2 Antibodies: NONREACTIVE
HIV-1 P24 Antigen - HIV24: NONREACTIVE

## 2016-08-30 LAB — SAVE SMEAR

## 2016-08-30 LAB — INFLUENZA PANEL BY PCR (TYPE A & B)
Influenza A By PCR: NEGATIVE
Influenza B By PCR: NEGATIVE

## 2016-08-30 LAB — LACTIC ACID, PLASMA: Lactic Acid, Venous: 1.4 mmol/L (ref 0.5–1.9)

## 2016-08-30 LAB — RAPID STREP SCREEN (MED CTR MEBANE ONLY): Streptococcus, Group A Screen (Direct): NEGATIVE

## 2016-08-30 MED ORDER — ACETAMINOPHEN 325 MG PO TABS
650.0000 mg | ORAL_TABLET | Freq: Once | ORAL | Status: AC
  Administered 2016-08-30: 650 mg via ORAL
  Filled 2016-08-30: qty 2

## 2016-08-30 NOTE — Discharge Instructions (Signed)
Your blood work today is consistent with blood work you had on February 21. I discussed with hematology. At this time they're recommending repeat blood work in 1-2 weeks to reassess. This may simply be related to a viral infection. If that is the case, then it should improve as it resolves.

## 2016-08-30 NOTE — ED Provider Notes (Signed)
[facility] DEPT Provider Note   CSN: [id number] Arrival date & time: 08/30/16  3491  By signing my name below, I, Evelene Croon, attest that this documentation has been prepared under the direction and in the presence of Virgel Manifold, MD . Electronically Signed: Evelene Croon, Scribe. 08/30/2016. 10:54 AM.  History   Chief Complaint Chief Complaint  Patient presents with  . Fever  . Abnormal Lab    The history is provided by the patient. No language interpreter was used.    HPI Comments:  Jaime Massey is a 41 y.o. female who presents to the Emergency Department complaining of a persistent HA x 2 weeks that waxes and wanes in severity. She describes pain to the top of her head but states when pain worsens she has pain diffusely throughout. Pt saw her PCP 5 days ago and at the time had a fever. She has had intermittent fevers since with  TMAX of 103.4. Pt was sent to do blood work by her PCP. She received results today (increased bands noted on CBC) and was instructed by her PCP to come to ED for further evaluation. At this time pt also notes occasional dry cough. She denies sore throat, nausea, urinary symptoms, acute weight loss, rash, vision changes, weakness, neck pain, dizziness/lighheadedness and  joint swelling. She also denies IVDA, incarceration, travel outside the country, h/o CA, and PE/DVT outside of pregnancy.     Past Medical History:  Diagnosis Date  . Arthritis   . DVT (deep venous thrombosis) ([place])   . PE (pulmonary embolism)   . Seizures ([place])    as child from severe ear infections- no meds now and no seizures since age 15    There are no active problems to display for this patient.   Past Surgical History:  Procedure Laterality Date  . CESAREAN SECTION    . CHOLECYSTECTOMY N/A 12/21/2012   Procedure: LAPAROSCOPIC CHOLECYSTECTOMY;  Surgeon: Donato Heinz, MD;  Location: [facility];  Service: General;  Laterality: N/A;    OB History    Gravida Para Term  Preterm AB Living   4 4 4     4    SAB TAB Ectopic Multiple Live Births                   Home Medications    Prior to Admission medications   Medication Sig Start Date End Date Taking? Authorizing Provider  dexamethasone (DECADRON) 4 MG tablet Take 1 tablet (4 mg total) by mouth 2 (two) times daily with a meal. 08/09/16   Lily Kocher, PA-C  diclofenac (VOLTAREN) 75 MG EC tablet Take 1 tablet (75 mg total) by mouth 2 (two) times daily. 08/09/16   Lily Kocher, PA-C  HYDROcodone-acetaminophen (NORCO/VICODIN) 5-325 MG per tablet Take 1 tablet by mouth every 4 (four) hours as needed. 07/17/14   Evalee Jefferson, PA-C  meloxicam (MOBIC) 15 MG tablet Take 1 tablet (15 mg total) by mouth daily. Patient not taking: Reported on 06/25/2014 01/04/14   Lily Kocher, PA-C  naproxen (NAPROSYN) 500 MG tablet Take 1 tablet (500 mg total) by mouth 2 (two) times daily with a meal. 02/04/15   Larene Pickett, PA-C  Naproxen Sodium 220 MG CAPS Take 440 mg by mouth 2 (two) times daily as needed (Pain).    Historical Provider, MD  predniSONE (DELTASONE) 20 MG tablet Take 3 tablets (60 mg total) by mouth daily. 06/25/14   Kristen N Ward, DO  traMADol (ULTRAM) 50 MG tablet 1  or 2 po q6h prn. 08/09/16   Lily Kocher, PA-C    Family History History reviewed. No pertinent family history.  Social History Social History  Substance Use Topics  . Smoking status: Current Every Day Smoker    Packs/day: 0.50    Years: 20.00    Types: Cigarettes  . Smokeless tobacco: Never Used  . Alcohol use No     Allergies   Penicillins   Review of Systems Review of Systems  Constitutional: Positive for fever. Negative for unexpected weight change.  Eyes: Negative for visual disturbance.  Respiratory: Positive for cough. Negative for shortness of breath.   Gastrointestinal: Negative for nausea.  Genitourinary: Negative for dysuria and hematuria.  Musculoskeletal: Negative for joint swelling and neck pain.  Skin: Negative for  rash.  Neurological: Positive for headaches. Negative for dizziness, weakness and light-headedness.  All other systems reviewed and are negative.    Physical Exam Updated Vital Signs BP 125/76 (BP Location: Left Arm)   Pulse 85   Temp 99 F (37.2 C) (Oral)   Resp 18   Ht 5' 9"  (1.753 m)   Wt 215 lb (97.5 kg)   SpO2 100%   BMI 31.75 kg/m   Physical Exam  Constitutional: She is oriented to person, place, and time. She appears well-developed and well-nourished. No distress.  Appears tired but non-toxic  HENT:  Head: Normocephalic and atraumatic.  Eyes: EOM are normal.  Neck: Normal range of motion.  Cardiovascular: Normal rate, regular rhythm and normal heart sounds.   Pulmonary/Chest: Effort normal and breath sounds normal.  Abdominal: Soft. She exhibits no distension. There is no tenderness.  Musculoskeletal: Normal range of motion.  Neurological: She is alert and oriented to person, place, and time.  Skin: Skin is warm and dry.  Psychiatric: She has a normal mood and affect. Judgment normal.  Nursing note and vitals reviewed.    ED Treatments / Results  DIAGNOSTIC STUDIES:  Oxygen Saturation is 100% on RA, normal by my interpretation.    COORDINATION OF CARE:  10:50 AM Discussed treatment plan with pt at bedside and pt agreed to plan.  Labs (all labs ordered are listed, but only abnormal results are displayed) Labs Reviewed  CBC WITH DIFFERENTIAL/PLATELET - Abnormal; Notable for the following:       Result Value   Platelets 77 (*)    All other components within normal limits  URINALYSIS, ROUTINE W REFLEX MICROSCOPIC - Abnormal; Notable for the following:    APPearance HAZY (*)    Hgb urine dipstick SMALL (*)    Bacteria, UA FEW (*)    Squamous Epithelial / LPF 0-5 (*)    All other components within normal limits  COMPREHENSIVE METABOLIC PANEL - Abnormal; Notable for the following:    Potassium 3.4 (*)    BUN <5 (*)    Calcium 8.4 (*)    Total Protein 6.0  (*)    Albumin 3.3 (*)    AST 42 (*)    All other components within normal limits  RAPID STREP SCREEN (NOT AT [facility])  CULTURE, BLOOD (ROUTINE X 2)  CULTURE, BLOOD (ROUTINE X 2)  CULTURE, GROUP A STREP ([place])  LACTIC ACID, PLASMA  RAPID HIV SCREEN (HIV 1/2 AB+AG)  LACTIC ACID, PLASMA  INFLUENZA PANEL BY PCR (TYPE A & B)  SAVE SMEAR  PATHOLOGIST SMEAR REVIEW    EKG  EKG Interpretation None       Radiology Dg Chest 2 View  Result Date: 08/30/2016 CLINICAL  DATA:  Headache with intermittent fever EXAM: CHEST  2 VIEW COMPARISON:  June 17, 2012 FINDINGS: Lungs are clear. Heart size and pulmonary vascularity are normal. No adenopathy. No bone lesions. IMPRESSION: No edema or consolidation. Electronically Signed   By: Lowella Grip III M.D.   On: 08/30/2016 11:41   Ct Head Wo Contrast  Result Date: 08/30/2016 CLINICAL DATA:  Chronic headache EXAM: CT HEAD WITHOUT CONTRAST TECHNIQUE: Contiguous axial images were obtained from the base of the skull through the vertex without intravenous contrast. COMPARISON:  May 06, 2011 FINDINGS: Brain: The ventricles are normal in size and configuration. There is no intracranial mass, hemorrhage, extra-axial fluid collection, or midline shift. Gray-white compartments are normal. No acute infarct evident. Vascular: No hyperdense vessel. There is no appreciable vascular calcification. Skull: The bony calvarium appears intact. Sinuses/Orbits: Visualized paranasal sinuses are clear. Orbits appear symmetric bilaterally. Other: Mastoid air cells are clear. IMPRESSION: Study within normal limits and stable. Electronically Signed   By: Lowella Grip III M.D.   On: 08/30/2016 11:40    Procedures Procedures (including critical care time)  Medications Ordered in ED Medications  acetaminophen (TYLENOL) tablet 650 mg (650 mg Oral Given 08/30/16 1236)     Initial Impression / Assessment and Plan / ED Course  I have reviewed the triage vital signs  and the nursing notes.  Pertinent labs & imaging results that were available during my care of the patient were reviewed by me and considered in my medical decision making (see chart for details).    41yF with headache for about two weeks and documented fever since Monday. Unclear if has been having fevers for longer. Not clear to me if HA is primary versus secondary from other process. Nursing note reviewed. Pt denying neck pain to me. No meningeal signs on exam. She really doesn't have a lot of complaints otherwise. Exam is nonfocal. Otherwise fairly healthy aside to what sounds likely distant PE related to pregnancy. Denies noticeable weight loss, prior incarceration, known malignancy, significant travel history, hx of drug use. Denies rash or joint swelling. NO respiratory complaints.   Her neuro exam is nonfocal. She was sent to the ED for further evaluation but doesn't present acutely ill. Will CT her head. Her neuro exam is nonfocal. My clinical suspicion for bacterial meningitis is extremely low. She is not encephalopathic. Will obtain some additional blood work including blood cultures. Will check for HIV. Will check UA because she reports this hasn't been done yet but she has no specific urinary complaints. Will check influenza because of the season, but not typical symptoms.   Differential is very broad at this point. In addition to infectious process, consider malignancy/autoimmune/etc.   12:50 PM Discussed labs and clinical picture with Dr. Benay Spice (hemoc), at this point he recommends repeat blood work in 1-2 weeks to reassess blood count; may be viral suppression.       Final Clinical Impressions(s) / ED Diagnoses   Final diagnoses:  Bandemia  Thrombocytopenia ([place])  Fever, unspecified fever cause    New Prescriptions New Prescriptions   No medications on file   I personally preformed the services scribed in my presence. The recorded information has been reviewed is  accurate. Virgel Manifold, MD.    Virgel Manifold, MD 09/16/16 [phone]

## 2016-08-30 NOTE — ED Triage Notes (Signed)
Pt to ER by private vehicle for evaluation of headaches x2 weeks with fever. Reports minimally stiff neck and dry non-productive cough. Pt was seen by PCP Monday, had blood work done and was sent here today for further testing for infection. Pt is a/o x4, NAD, VSS.

## 2016-09-01 LAB — CULTURE, GROUP A STREP (THRC)

## 2016-09-02 ENCOUNTER — Telehealth: Payer: Self-pay | Admitting: Emergency Medicine

## 2016-09-02 NOTE — Telephone Encounter (Signed)
Post ED Visit - Positive Culture Follow-up  Culture report reviewed by antimicrobial stewardship pharmacist:  []  Elenor Quinones, Pharm.D. []  Heide Guile, Pharm.D., BCPS []  Parks Neptune, Pharm.D. [x]  Alycia Rossetti, Pharm.D., BCPS []  [place], Pharm.D., BCPS, AAHIVP []  Legrand Como, Pharm.D., BCPS, AAHIVP []  Milus Glazier, Pharm.D. []  Stephens November, Pharm.D.  Positive strep culture Treated with none, asymptomatic, no further patient follow-up is required at this time.  Hazle Nordmann 09/02/2016, 11:28 AM

## 2016-09-03 LAB — PATHOLOGIST SMEAR REVIEW

## 2016-09-04 LAB — CULTURE, BLOOD (ROUTINE X 2)
Culture: NO GROWTH
Culture: NO GROWTH

## 2016-09-05 DIAGNOSIS — D72825 Bandemia: Secondary | ICD-10-CM | POA: Diagnosis not present

## 2016-09-05 DIAGNOSIS — R509 Fever, unspecified: Secondary | ICD-10-CM | POA: Diagnosis not present

## 2016-10-01 ENCOUNTER — Encounter (HOSPITAL_COMMUNITY): Payer: 59

## 2016-10-01 ENCOUNTER — Encounter (HOSPITAL_COMMUNITY): Payer: 59 | Attending: Oncology | Admitting: Oncology

## 2016-10-01 ENCOUNTER — Encounter (HOSPITAL_COMMUNITY): Payer: Self-pay

## 2016-10-01 VITALS — BP 119/71 | HR 83 | Temp 98.7°F | Resp 18 | Ht 69.0 in | Wt 217.9 lb

## 2016-10-01 DIAGNOSIS — R509 Fever, unspecified: Secondary | ICD-10-CM | POA: Insufficient documentation

## 2016-10-01 DIAGNOSIS — R51 Headache: Secondary | ICD-10-CM

## 2016-10-01 DIAGNOSIS — R0781 Pleurodynia: Secondary | ICD-10-CM | POA: Diagnosis not present

## 2016-10-01 DIAGNOSIS — D696 Thrombocytopenia, unspecified: Secondary | ICD-10-CM | POA: Diagnosis not present

## 2016-10-01 LAB — COMPREHENSIVE METABOLIC PANEL
ALBUMIN: 4 g/dL (ref 3.5–5.0)
ALK PHOS: 57 U/L (ref 38–126)
ALT: 19 U/L (ref 14–54)
AST: 19 U/L (ref 15–41)
Anion gap: 7 (ref 5–15)
BILIRUBIN TOTAL: 0.7 mg/dL (ref 0.3–1.2)
BUN: 11 mg/dL (ref 6–20)
CALCIUM: 9.1 mg/dL (ref 8.9–10.3)
CO2: 24 mmol/L (ref 22–32)
CREATININE: 0.7 mg/dL (ref 0.44–1.00)
Chloride: 104 mmol/L (ref 101–111)
GFR calc Af Amer: >60 mL/min (ref >60)
GFR calc non Af Amer: >60 mL/min (ref >60)
GLUCOSE: 90 mg/dL (ref 65–99)
Potassium: 3.7 mmol/L (ref 3.5–5.1)
Sodium: 135 mmol/L (ref 135–145)
Total Protein: 7.7 g/dL (ref 6.5–8.1)

## 2016-10-01 LAB — CBC WITH DIFFERENTIAL/PLATELET
BASOS ABS: 0 10*3/uL (ref 0.0–0.1)
Basophils Relative: 0 %
Eosinophils Absolute: 0.1 10*3/uL (ref 0.0–0.7)
Eosinophils Relative: 1 %
HEMATOCRIT: 37 % (ref 36.0–46.0)
HEMOGLOBIN: 13 g/dL (ref 12.0–15.0)
LYMPHS PCT: 34 %
Lymphs Abs: 2.6 10*3/uL (ref 0.7–4.0)
MCH: 32.6 pg (ref 26.0–34.0)
MCHC: 35.1 g/dL (ref 30.0–36.0)
MCV: 92.7 fL (ref 78.0–100.0)
MONO ABS: 0.4 10*3/uL (ref 0.1–1.0)
Monocytes Relative: 6 %
NEUTROS ABS: 4.6 10*3/uL (ref 1.7–7.7)
NEUTROS PCT: 59 %
Platelets: 174 10*3/uL (ref 150–400)
RBC: 3.99 MIL/uL (ref 3.87–5.11)
RDW: 13.2 % (ref 11.5–15.5)
WBC: 7.7 10*3/uL (ref 4.0–10.5)

## 2016-10-01 LAB — VITAMIN B12: VITAMIN B 12: 263 pg/mL (ref 180–914)

## 2016-10-01 LAB — LACTATE DEHYDROGENASE: LDH: 185 U/L (ref 98–192)

## 2016-10-01 LAB — FOLATE: Folate: 11 ng/mL (ref >5.9)

## 2016-10-01 LAB — TSH: TSH: 1.242 u[IU]/mL (ref 0.350–4.500)

## 2016-10-01 NOTE — Progress Notes (Signed)
[place] NOTE  Patient Care Team: Zella Richer. Scotty Court, MD as PCP - General (Unknown Physician Specialty)  CHIEF COMPLAINTS/PURPOSE OF CONSULTATION:  Thrombocytopenia   No history exists.   HISTORY OF PRESENTING ILLNESS:  Jaime Massey 41 y.o. female is here because of thrombocytopenia. Pt was also found to have excessive bands and an unexplained fever. She reported to the ED on 08/30/2016 with fevers and persistent headaches, and was noted to have thrombocytopenia and excessive bands. She continues to run fevers up to 102.6, which are resolved with tylenol or ibuprofen.  She had an infectious workup including sed rate, monospot, HIV, influenza, strep, blood cultures, UA, and chest Xray which were all negative. She had a CT head without contrast as well for her headaches but that was negative as well. CBC with differential on 08/28/16 demonstrated WBC 5.5k, hgb 14.2 g/dL, hematocrit 39.4%, platelet 86k, differential demonstrated bandemia with 25% bands and atypical lymphocytes, no blasts, but otherwise was normal. Repeat CBC with differential on 08/30/16 demonstrated a WBC 5.4k, hemoglobin 13.4g/dL, hematocrit 37.8%, platelet 77k, differential 0% blasts, bands 20%. A referral was made to infectious disease however they wanted a hematology consult to rule out malignancy first.   She went last week without a fever, but had one over the weekend and yesterday. Denies a fever today. Afebrile today in the office. She hasn't been put on any antibiotics. She was also having headaches and a sharp right sided rib pain, but other than that, she has not been having any other symptoms. These headaches were happening every day but have become less frequent. Denies history of migraines. Denies abdominal pain, dysuria, SOB, swollen lymph nodes, or any other concerns.   MEDICAL HISTORY:  Past Medical History:  Diagnosis Date  . Arthritis   . DVT (deep venous thrombosis) ([place])   . PE  (pulmonary embolism)   . Seizures ([place])    as child from severe ear infections- no meds now and no seizures since age 17    SURGICAL HISTORY: Past Surgical History:  Procedure Laterality Date  . CESAREAN SECTION    . CHOLECYSTECTOMY N/A 12/21/2012   Procedure: LAPAROSCOPIC CHOLECYSTECTOMY;  Surgeon: Donato Heinz, MD;  Location: [facility];  Service: General;  Laterality: N/A;    SOCIAL HISTORY: Social History   Social History  . Marital status: Divorced    Spouse name: N/A  . Number of children: N/A  . Years of education: N/A   Occupational History  . Not on file.   Social History Main Topics  . Smoking status: Current Every Day Smoker    Packs/day: 0.75    Years: 26.00    Types: Cigarettes  . Smokeless tobacco: Never Used  . Alcohol use No     Comment: reports occasional beer but very seldom  . Drug use: No  . Sexual activity: Yes    Birth control/ protection: IUD   Other Topics Concern  . Not on file   Social History Narrative  . No narrative on file    FAMILY HISTORY: History reviewed. No pertinent family history.  ALLERGIES:  is allergic to penicillins.  MEDICATIONS:  Current Outpatient Prescriptions  Medication Sig Dispense Refill  . acetaminophen (TYLENOL) 325 MG tablet Take 650 mg by mouth every 6 (six) hours as needed.    . diclofenac (VOLTAREN) 75 MG EC tablet Take 1 tablet (75 mg total) by mouth 2 (two) times daily. (Patient not taking: Reported on 10/01/2016) 14 tablet  0  . ibuprofen (ADVIL,MOTRIN) 200 MG tablet Take 200 mg by mouth every 6 (six) hours as needed.    . Naproxen Sodium 220 MG CAPS Take 440 mg by mouth 2 (two) times daily as needed (Pain).     No current facility-administered medications for this visit.    Review of Systems  Constitutional: Positive for fever.  HENT: Negative.   Eyes: Negative.   Respiratory: Negative.  Negative for shortness of breath.   Cardiovascular: Positive for chest pain (R sided rib pain).    Gastrointestinal: Negative.  Negative for abdominal pain.  Genitourinary: Negative.  Negative for dysuria.  Musculoskeletal: Negative.   Skin: Negative.   Neurological: Positive for headaches.  Endo/Heme/Allergies: Negative.   Psychiatric/Behavioral: Negative.   All other systems reviewed and are negative. 14 point ROS was done and is otherwise as detailed above or in HPI  PHYSICAL EXAMINATION: ECOG PERFORMANCE STATUS: 1 - Symptomatic but completely ambulatory  Vitals:   10/01/16 0934  BP: 119/71  Pulse: 83  Resp: 18  Temp: 98.7 F (37.1 C)   Filed Weights   10/01/16 0934  Weight: 217 lb 14.4 oz (98.8 kg)   Physical Exam  Constitutional: She is oriented to person, place, and time and well-developed, well-nourished, and in no distress.  HENT:  Head: Normocephalic and atraumatic.  Eyes: EOM are normal. Pupils are equal, round, and reactive to light.  Neck: Normal range of motion. Neck supple.  Cardiovascular: Normal rate, regular rhythm and normal heart sounds.   Pulmonary/Chest: Effort normal and breath sounds normal.  Abdominal: Soft. Bowel sounds are normal.  Musculoskeletal: Normal range of motion.  Lymphadenopathy:       Head (right side): No submental and no submandibular adenopathy present.       Head (left side): No submental and no submandibular adenopathy present.    She has no cervical adenopathy.    She has no axillary adenopathy.  Neurological: She is alert and oriented to person, place, and time. Gait normal.  Skin: Skin is warm and dry.  Nursing note and vitals reviewed.  LABORATORY DATA:  I have reviewed the data as listed Lab Results  Component Value Date   WBC 5.4 08/30/2016   HGB 13.4 08/30/2016   HCT 37.8 08/30/2016   MCV 92.0 08/30/2016   PLT 77 (L) 08/30/2016   CMP     Component Value Date/Time   NA 141 08/30/2016 1120   K 3.4 (L) 08/30/2016 1120   CL 110 08/30/2016 1120   CO2 22 08/30/2016 1120   GLUCOSE 91 08/30/2016 1120   BUN <5  (L) 08/30/2016 1120   CREATININE 0.68 08/30/2016 1120   CALCIUM 8.4 (L) 08/30/2016 1120   PROT 6.0 (L) 08/30/2016 1120   ALBUMIN 3.3 (L) 08/30/2016 1120   AST 42 (H) 08/30/2016 1120   ALT 50 08/30/2016 1120   ALKPHOS 61 08/30/2016 1120   BILITOT 0.6 08/30/2016 1120   GFRNONAA >60 08/30/2016 1120   GFRAA >60 08/30/2016 1120   Outside Labs 08/28/2016 Platelet Count   86 (L) MPV   10.8 (H) Bands    25 (H)  RADIOGRAPHIC STUDIES: I have personally reviewed the radiological images as listed and agreed with the findings in the report.  CT Head wo Contrast 08/30/2016 IMPRESSION: Study within normal limits and stable.  DG Chest 2 View 08/30/2016 IMPRESSION: No edema or consolidation.  ASSESSMENT & PLAN:  New Onset Thrombocytopenia  Fever of Unknown Origin  PLAN: Very low suspicion that patient has  an underlying malignancy. Suspect thrombocytopenia is likely due to bone marrow suppression from possible viral infection. Based on patient's symptoms, I suspect she may possibly have aseptic meningitis as a differential causing her symptoms of fevers and headaches.  I will perform a thrombocytopenia workup with labs as stated below. Orders Placed This Encounter  Procedures  . CBC with Differential    Standing Status:   Future    Number of Occurrences:   1    Standing Expiration Date:   10/01/2017  . Comprehensive metabolic panel    Standing Status:   Future    Number of Occurrences:   1    Standing Expiration Date:   10/01/2017  . Pathologist smear review    Standing Status:   Future    Number of Occurrences:   1    Standing Expiration Date:   10/01/2017  . Vitamin B12    Standing Status:   Future    Number of Occurrences:   1    Standing Expiration Date:   10/01/2017  . Folate    Standing Status:   Future    Number of Occurrences:   1    Standing Expiration Date:   10/01/2017  . Lactate dehydrogenase    Standing Status:   Future    Number of Occurrences:   1    Standing  Expiration Date:   10/01/2017  . Protein electrophoresis, serum    Standing Status:   Future    Number of Occurrences:   1    Standing Expiration Date:   10/01/2017  . Hepatitis panel, acute    Standing Status:   Future    Number of Occurrences:   1    Standing Expiration Date:   10/01/2017  . TSH    Standing Status:   Future    Number of Occurrences:   1    Standing Expiration Date:   10/01/2017  . Copper, serum    Standing Status:   Future    Number of Occurrences:   1    Standing Expiration Date:   10/01/2017    She will return for follow up in 1 week.   ORDERS PLACED FOR THIS ENCOUNTER: No orders of the defined types were placed in this encounter.   MEDICATIONS PRESCRIBED THIS ENCOUNTER: Meds ordered this encounter  Medications  . acetaminophen (TYLENOL) 325 MG tablet    Sig: Take 650 mg by mouth every 6 (six) hours as needed.  Marland Kitchen ibuprofen (ADVIL,MOTRIN) 200 MG tablet    Sig: Take 200 mg by mouth every 6 (six) hours as needed.   All questions were answered. The patient knows to call the clinic with any problems, questions or concerns.  This document serves as a record of services personally performed by Twana First, MD. It was created on her behalf by Martinique Casey, a trained medical scribe. The creation of this record is based on the scribe's personal observations and the provider's statements to them. This document has been checked and approved by the attending provider.  I have reviewed the above documentation for accuracy and completeness and I agree with the above.  This note was electronically signed.    Martinique M Casey  10/01/2016 9:40 AM

## 2016-10-01 NOTE — Patient Instructions (Signed)
[place] at [facility] Discharge Instructions  RECOMMENDATIONS MADE BY THE CONSULTANT AND ANY TEST RESULTS WILL BE SENT TO YOUR REFERRING PHYSICIAN.  You were seen today by Dr. Twana First Labs today Follow up in 1 weeks See Amy up front for appointments   Thank you for choosing [place] at [facility] to provide your oncology and hematology care.  To afford each patient quality time with our provider, please arrive at least 15 minutes before your scheduled appointment time.    If you have a lab appointment with the [facility] please come in thru the  Main Entrance and check in at the main information desk  You need to re-schedule your appointment should you arrive 10 or more minutes late.  We strive to give you quality time with our providers, and arriving late affects you and other patients whose appointments are after yours.  Also, if you no show three or more times for appointments you may be dismissed from the clinic at the providers discretion.     Again, thank you for choosing [place] [facility].  Our hope is that these requests will decrease the amount of time that you wait before being seen by our physicians.       _____________________________________________________________  Should you have questions after your visit to [facility], please contact our office at [phone] between the hours of 8:30 a.m. and 4:30 p.m.  Voicemails left after 4:30 p.m. will not be returned until the following business day.  For prescription refill requests, have your pharmacy contact our office.       Resources For Cancer Patients and their Caregivers ? American Cancer Society: Can assist with transportation, wigs, general needs, runs Look Good Feel Better.        587-142-2591 ? Cancer Care: Provides financial assistance, online support groups, medication/co-pay assistance.  1-800-813-HOPE (779) 723-2705) ? [place] Assists [place] cancer patients and their families through emotional , educational and financial support.  678-015-0150 ? [place] DSS Where to apply for food stamps, Medicaid and utility assistance. [phone] ? RCATS: Transportation to medical appointments. 405-380-4805 ? Social Security Administration: May apply for disability if have a Stage IV cancer. 315-153-2595 [phone] ? [employer], Disability and Transit Services: Assists with nutrition, care and transit needs. [place] Support Programs: @10RELATIVEDAYS @ > Cancer Support Group  2nd Tuesday of the month 1pm-2pm, Journey Room  > Creative Journey  3rd Tuesday of the month 1130am-1pm, Journey Room  > Look Good Feel Better  1st Wednesday of the month 10am-12 noon, Journey Room (Call [place] to register [phone])

## 2016-10-02 LAB — HEPATITIS PANEL, ACUTE
HCV AB: 1.5 {s_co_ratio} — AB (ref 0.0–0.9)
HEP A IGM: NEGATIVE
Hep B C IgM: NEGATIVE
Hepatitis B Surface Ag: NEGATIVE

## 2016-10-02 LAB — PROTEIN ELECTROPHORESIS, SERUM
A/G RATIO SPE: 1 (ref 0.7–1.7)
ALPHA-1-GLOBULIN: 0.2 g/dL (ref 0.0–0.4)
Albumin ELP: 3.7 g/dL (ref 2.9–4.4)
Alpha-2-Globulin: 0.6 g/dL (ref 0.4–1.0)
Beta Globulin: 1 g/dL (ref 0.7–1.3)
GLOBULIN, TOTAL: 3.6 g/dL (ref 2.2–3.9)
Gamma Globulin: 1.9 g/dL — ABNORMAL HIGH (ref 0.4–1.8)
Total Protein ELP: 7.3 g/dL (ref 6.0–8.5)

## 2016-10-03 LAB — COPPER, SERUM: COPPER: 123 ug/dL (ref 72–166)

## 2016-10-10 ENCOUNTER — Encounter (HOSPITAL_COMMUNITY): Payer: Self-pay

## 2016-10-10 ENCOUNTER — Encounter (HOSPITAL_COMMUNITY): Payer: 59 | Attending: Oncology | Admitting: Oncology

## 2016-10-10 VITALS — BP 112/73 | HR 79 | Temp 99.1°F | Resp 18 | Wt 215.7 lb

## 2016-10-10 DIAGNOSIS — B1920 Unspecified viral hepatitis C without hepatic coma: Secondary | ICD-10-CM

## 2016-10-10 DIAGNOSIS — D696 Thrombocytopenia, unspecified: Secondary | ICD-10-CM | POA: Diagnosis not present

## 2016-10-10 DIAGNOSIS — R509 Fever, unspecified: Secondary | ICD-10-CM | POA: Diagnosis not present

## 2016-10-10 DIAGNOSIS — R51 Headache: Secondary | ICD-10-CM

## 2016-10-10 DIAGNOSIS — Z720 Tobacco use: Secondary | ICD-10-CM | POA: Diagnosis not present

## 2016-10-10 NOTE — Progress Notes (Signed)
[place]  PROGRESS NOTE  Patient Care Team: Zella Richer. Scotty Court, MD as PCP - General (Unknown Physician Specialty)  CHIEF COMPLAINTS/PURPOSE OF CONSULTATION:  Thrombocytopenia   No history exists.   HISTORY OF PRESENTING ILLNESS:  Jaime Massey 41 y.o. female is here because of thrombocytopenia. Pt was also found to have excessive bands and an unexplained fever. She reported to the ED on 08/30/2016 with fevers and persistent headaches, and was noted to have thrombocytopenia and excessive bands. She continues to run fevers up to 102.6, which are resolved with tylenol or ibuprofen.  She had an infectious workup including sed rate, monospot, HIV, influenza, strep, blood cultures, UA, and chest Xray which were all negative. She had a CT head without contrast as well for her headaches but that was negative as well. CBC with differential on 08/28/16 demonstrated WBC 5.5k, hgb 14.2 g/dL, hematocrit 39.4%, platelet 86k, differential demonstrated bandemia with 25% bands and atypical lymphocytes, no blasts, but otherwise was normal. Repeat CBC with differential on 08/30/16 demonstrated a WBC 5.4k, hemoglobin 13.4g/dL, hematocrit 37.8%, platelet 77k, differential 0% blasts, bands 20%. A referral was made to infectious disease however they wanted a hematology consult to rule out malignancy first.    Patient presents today for continued follow up and to review labs. I personally reviewed and went over lab results with the patient.  Her platelets have gone back up to normal. Workup for her thrombocytopenia including folate, b12, copper, SPEP were all normal. An acute hepatitis panel was checked as part of her workup and the hepatis C antibody was positive. She denies any history of occupational needle sticks, IV drug use, unprotected sex, history of liver problems.  She notes that she continues to get fevers. She notes that her headaches are better. She still gets them intermittently but  currently her only concern is her sinuses.   Patient does not have any other questions or concerns at this tme     MEDICAL HISTORY:  Past Medical History:  Diagnosis Date  . Arthritis   . DVT (deep venous thrombosis) ([place])   . PE (pulmonary embolism)   . Seizures ([place])    as child from severe ear infections- no meds now and no seizures since age 50    SURGICAL HISTORY: Past Surgical History:  Procedure Laterality Date  . CESAREAN SECTION    . CHOLECYSTECTOMY N/A 12/21/2012   Procedure: LAPAROSCOPIC CHOLECYSTECTOMY;  Surgeon: Donato Heinz, MD;  Location: [facility];  Service: General;  Laterality: N/A;    SOCIAL HISTORY: Social History   Social History  . Marital status: Divorced    Spouse name: N/A  . Number of children: N/A  . Years of education: N/A   Occupational History  . Not on file.   Social History Main Topics  . Smoking status: Current Every Day Smoker    Packs/day: 0.75    Years: 26.00    Types: Cigarettes  . Smokeless tobacco: Never Used  . Alcohol use No     Comment: reports occasional beer but very seldom  . Drug use: No  . Sexual activity: Yes    Birth control/ protection: IUD   Other Topics Concern  . Not on file   Social History Narrative  . No narrative on file    FAMILY HISTORY: History reviewed. No pertinent family history.  ALLERGIES:  is allergic to penicillins.  MEDICATIONS:  Current Outpatient Prescriptions  Medication Sig Dispense Refill  . acetaminophen (TYLENOL) 325 MG  tablet Take 650 mg by mouth every 6 (six) hours as needed.    . diclofenac (VOLTAREN) 75 MG EC tablet Take 1 tablet (75 mg total) by mouth 2 (two) times daily. 14 tablet 0  . ibuprofen (ADVIL,MOTRIN) 200 MG tablet Take 200 mg by mouth every 6 (six) hours as needed.    . Naproxen Sodium 220 MG CAPS Take 440 mg by mouth 2 (two) times daily as needed (Pain).     No current facility-administered medications for this visit.    Review of Systems  Constitutional:  Positive for fever.  HENT: Negative.   Eyes: Negative.   Respiratory: Negative.   Cardiovascular: Positive for chest pain (intermittent R sided rib pain).  Gastrointestinal: Negative.   Genitourinary: Negative.   Musculoskeletal: Negative.   Skin: Negative.   Neurological: Positive for headaches (intermittent, better than previously).  Endo/Heme/Allergies: Negative.   Psychiatric/Behavioral: Negative.   All other systems reviewed and are negative. 14 point ROS was done and is otherwise as detailed above or in HPI  PHYSICAL EXAMINATION: ECOG PERFORMANCE STATUS: 1 - Symptomatic but completely ambulatory  Vitals:   10/10/16 1011  BP: 112/73  Pulse: 79  Resp: 18  Temp: 99.1 F (37.3 C)   Filed Weights   10/10/16 1011  Weight: 215 lb 11.2 oz (97.8 kg)      Physical Exam  Constitutional: She is oriented to person, place, and time and well-developed, well-nourished, and in no distress.  HENT:  Head: Normocephalic and atraumatic.  Eyes: Conjunctivae and EOM are normal. Pupils are equal, round, and reactive to light.  Neck: Normal range of motion. Neck supple.  Cardiovascular: Normal rate, regular rhythm and normal heart sounds.   Pulmonary/Chest: Effort normal and breath sounds normal.  Abdominal: Soft. Bowel sounds are normal.  Musculoskeletal: Normal range of motion.  Lymphadenopathy:    She has no cervical adenopathy.    She has no axillary adenopathy.  Neurological: She is alert and oriented to person, place, and time. Gait normal.  Skin: Skin is warm and dry.  Nursing note and vitals reviewed.  LABORATORY DATA:  I have reviewed the data as listed Lab Results  Component Value Date   WBC 7.7 10/01/2016   HGB 13.0 10/01/2016   HCT 37.0 10/01/2016   MCV 92.7 10/01/2016   PLT 174 10/01/2016   CMP     Component Value Date/Time   NA 135 10/01/2016 1012   K 3.7 10/01/2016 1012   CL 104 10/01/2016 1012   CO2 24 10/01/2016 1012   GLUCOSE 90 10/01/2016 1012   BUN  11 10/01/2016 1012   CREATININE 0.70 10/01/2016 1012   CALCIUM 9.1 10/01/2016 1012   PROT 7.7 10/01/2016 1012   ALBUMIN 4.0 10/01/2016 1012   AST 19 10/01/2016 1012   ALT 19 10/01/2016 1012   ALKPHOS 57 10/01/2016 1012   BILITOT 0.7 10/01/2016 1012   GFRNONAA >60 10/01/2016 1012   GFRAA >60 10/01/2016 1012   Outside Labs 08/28/2016 Platelet Count   86 (L) MPV   10.8 (H) Bands    25 (H)  RADIOGRAPHIC STUDIES: I have personally reviewed the radiological images as listed and agreed with the findings in the report.  CT Head wo Contrast 08/30/2016 IMPRESSION: Study within normal limits and stable.  DG Chest 2 View 08/30/2016 IMPRESSION: No edema or consolidation.  ASSESSMENT & PLAN:  New Onset Thrombocytopenia  Fever of Unknown Origin New Hepatitis C diagnosis  PLAN: Labs reviewed with the patient. The thrombocytopenia has resolved  and was likely due to transient bone marrow suppression.  Lab results demonstrate she is positive for Hepatitis C. I will refer her to gastroenterology for evaluation and treatment.   Patient's fevers are not related to underlying malignancy.   I will not give her a return appointment as her condition is not oncologically related. I will turn over her care back to her PCP for routine monitoring of her CBC. Please send patient back again in the future should any new heme/onc issues arise.  All questions were answered. The patient knows to call the clinic with any problems, questions or concerns.  This document serves as a record of services personally performed by Twana First, MD. It was created on her behalf by Shirlean Mylar, a trained medical scribe. The creation of this record is based on the scribe's personal observations and the provider's statements to them. This document has been checked and approved by the attending provider.  I have reviewed the above documentation for accuracy and completeness and I agree with the above.  This note was  electronically signed.    Mikey [employer]  10/10/2016 10:28 AM

## 2016-10-10 NOTE — Patient Instructions (Signed)
[place] at [facility] Discharge Instructions  RECOMMENDATIONS MADE BY THE CONSULTANT AND ANY TEST RESULTS WILL BE SENT TO YOUR REFERRING PHYSICIAN.  You were seen today by Dr. Twana First Follow up as needed here at our clinic We will refer you to GI specialist and they will be in contact with you for your appointment See Amy up front for appointments   Thank you for choosing [place] at [facility] to provide your oncology and hematology care.  To afford each patient quality time with our provider, please arrive at least 15 minutes before your scheduled appointment time.    If you have a lab appointment with the [facility] please come in thru the  Main Entrance and check in at the main information desk  You need to re-schedule your appointment should you arrive 10 or more minutes late.  We strive to give you quality time with our providers, and arriving late affects you and other patients whose appointments are after yours.  Also, if you no show three or more times for appointments you may be dismissed from the clinic at the providers discretion.     Again, thank you for choosing [facility].  Our hope is that these requests will decrease the amount of time that you wait before being seen by our physicians.       _____________________________________________________________  Should you have questions after your visit to [facility], please contact our office at [phone] between the hours of 8:30 a.m. and 4:30 p.m.  Voicemails left after 4:30 p.m. will not be returned until the following business day.  For prescription refill requests, have your pharmacy contact our office.       Resources For Cancer Patients and their Caregivers ? American Cancer Society: Can assist with transportation, wigs, general needs, runs Look Good Feel Better.        ([phone] ? Cancer Care: Provides financial  assistance, online support groups, medication/co-pay assistance.  1-800-813-HOPE 612-101-7178) ? [place] Assists [place] cancer patients and their families through emotional , educational and financial support.  424-010-8268 ? [place] DSS Where to apply for food stamps, Medicaid and utility assistance. 8483707423 ? [employer]: Transportation to medical appointments. 626-530-3448 ? Social Security Administration: May apply for disability if have a Stage IV cancer. [phone] ? [employer], Disability and Transit Services: Assists with nutrition, care and transit needs. [place] Support Programs: @10RELATIVEDAYS @ > Cancer Support Group  2nd Tuesday of the month 1pm-2pm, Journey Room  > Creative Journey  3rd Tuesday of the month 1130am-1pm, Journey Room  > Look Good Feel Better  1st Wednesday of the month 10am-12 noon, Journey Room (Call [place] to register [phone])

## 2016-10-14 ENCOUNTER — Encounter: Payer: Self-pay | Admitting: Gastroenterology

## 2016-10-15 DIAGNOSIS — R768 Other specified abnormal immunological findings in serum: Secondary | ICD-10-CM | POA: Diagnosis not present

## 2016-10-15 DIAGNOSIS — R509 Fever, unspecified: Secondary | ICD-10-CM | POA: Diagnosis not present

## 2016-10-15 DIAGNOSIS — F4322 Adjustment disorder with anxiety: Secondary | ICD-10-CM | POA: Diagnosis not present

## 2016-10-15 DIAGNOSIS — D72825 Bandemia: Secondary | ICD-10-CM | POA: Diagnosis not present

## 2016-10-18 DIAGNOSIS — R768 Other specified abnormal immunological findings in serum: Secondary | ICD-10-CM | POA: Diagnosis not present

## 2016-11-27 ENCOUNTER — Ambulatory Visit: Payer: Self-pay | Admitting: Gastroenterology

## 2016-12-03 ENCOUNTER — Ambulatory Visit (INDEPENDENT_AMBULATORY_CARE_PROVIDER_SITE_OTHER): Payer: 59 | Admitting: Internal Medicine

## 2016-12-03 ENCOUNTER — Encounter: Payer: Self-pay | Admitting: Internal Medicine

## 2016-12-03 VITALS — BP 112/76 | HR 79 | Temp 98.3°F | Ht 69.0 in | Wt 220.0 lb

## 2016-12-03 DIAGNOSIS — M779 Enthesopathy, unspecified: Secondary | ICD-10-CM | POA: Diagnosis not present

## 2016-12-03 DIAGNOSIS — R509 Fever, unspecified: Secondary | ICD-10-CM | POA: Diagnosis not present

## 2016-12-03 LAB — CBC WITH DIFFERENTIAL/PLATELET
BASOS PCT: 0 %
Basophils Absolute: 0 cells/uL (ref 0–200)
Eosinophils Absolute: 73 cells/uL (ref 15–500)
Eosinophils Relative: 1 %
HEMATOCRIT: 40.7 % (ref 35.0–45.0)
HEMOGLOBIN: 13.8 g/dL (ref 11.7–15.5)
LYMPHS ABS: 2847 {cells}/uL (ref 850–3900)
Lymphocytes Relative: 39 %
MCH: 32.6 pg (ref 27.0–33.0)
MCHC: 33.9 g/dL (ref 32.0–36.0)
MCV: 96.2 fL (ref 80.0–100.0)
MONO ABS: 365 {cells}/uL (ref 200–950)
MPV: 10.6 fL (ref 7.5–12.5)
Monocytes Relative: 5 %
Neutro Abs: 4015 cells/uL (ref 1500–7800)
Neutrophils Relative %: 55 %
Platelets: 181 10*3/uL (ref 140–400)
RBC: 4.23 MIL/uL (ref 3.80–5.10)
RDW: 13.1 % (ref 11.0–15.0)
WBC: 7.3 10*3/uL (ref 3.8–10.8)

## 2016-12-03 NOTE — Progress Notes (Signed)
[facility]      Reason for Consult: FUO    Referring Physician: Dr. Scotty Court    Patient ID: Jaime Massey, female    DOB: [birth year], 41 y.o.   MRN: [id number]  HPI:   She reports a recurrent fever since February.  It began with a fever up to 102, associated with headache and myalgias and work up initially revealed bandemia in a peripheral cbc and thrombocytopenia.  She was initially sent to the ED at the time but no source found.  Subsequently she has really had a persistent, though intermittent fever since that time.  She brings in a fever log and has temperatures recorded 3-4 times per week of 101 degrees or higher.  She tells me she gets headacheds with these and continued myalgias.  She also has noted some joint swelling, though her knees are a chronic issue.  She has not had any rashes associated with it or really any other symptoms including no sob, no cough, no diarrhea, no dysuria, no vaginal discharge. She takes acetaminophen and ibuprofen daily for her bone spurs and knee pain which have been chronic issues.  She has had a thorough work up by her PCP and went to hematology and no etiology found.   CXR independently reviewed and no active disease or concerns.  Previous record reviewed from Epic and PCP.  Has had work up including HIV, inflammatory markers, hepatitis panel.    Past Medical History:  Diagnosis Date  . Arthritis   . DVT (deep venous thrombosis) ([place])   . PE (pulmonary embolism)   . Seizures ([place])    as child from severe ear infections- no meds now and no seizures since age 39    Prior to Admission medications   Medication Sig Start Date End Date Taking? Authorizing Provider  acetaminophen (TYLENOL) 325 MG tablet Take 650 mg by mouth every 6 (six) hours as needed.   Yes [provider]  ibuprofen (ADVIL,MOTRIN) 200 MG tablet Take 200 mg by mouth every 6 (six) hours as needed.   Yes [provider]  diclofenac  (VOLTAREN) 75 MG EC tablet Take 1 tablet (75 mg total) by mouth 2 (two) times daily. Patient not taking: Reported on 12/03/2016 08/09/16   Lily Kocher, PA-C  Naproxen Sodium 220 MG CAPS Take 440 mg by mouth 2 (two) times daily as needed (Pain).    [provider]    Allergies  Allergen Reactions  . Penicillins Rash    Social History  Substance Use Topics  . Smoking status: Current Every Day Smoker    Packs/day: 0.75    Years: 26.00    Types: Cigarettes  . Smokeless tobacco: Never Used  . Alcohol use No     Comment: reports occasional beer but very seldom    FH: No autoimmune disease, no rheumatoid arthritis, no SLE  Review of Systems  Constitutional: positive for fevers, fatigue and malaise or negative for chills, anorexia and weight loss Ears, nose, mouth, throat, and face: negative for earaches and nasal congestion Respiratory: negative for cough, sputum or pleurisy/chest pain Gastrointestinal: negative for nausea, vomiting and diarrhea Genitourinary: negative for frequency, dysuria and genital lesions and vaginal discharge Integument/breast: negative for rash and pruritus Musculoskeletal: negative for myalgias and arthralgias, no calf tenderness All other systems reviewed and are negative    Constitutional: in no apparent distress and alert  Vitals:   12/03/16 1341  BP: 112/76  Pulse: 79  Temp:  98.3 F (36.8 C)   EYES: anicteric ENMT: Cardiovascular: Cor RRR Respiratory: CTA B; normal respiratory effort GI: Bowel sounds are normal, liver is not enlarged, spleen is not enlarged, soft, mild tenderness on right to deep palpation Musculoskeletal: peripheral pulses normal, no pedal edema, no clubbing or cyanosis Skin: negatives: no rash Hematologic: no cervical, supraclavicular or axillary LAD  Labs: Lab Results  Component Value Date   WBC 7.7 10/01/2016   HGB 13.0 10/01/2016   HCT 37.0 10/01/2016   MCV 92.7 10/01/2016   PLT 174 10/01/2016    Lab  Results  Component Value Date   CREATININE 0.70 10/01/2016   BUN 11 10/01/2016   NA 135 10/01/2016   K 3.7 10/01/2016   CL 104 10/01/2016   CO2 24 10/01/2016    Lab Results  Component Value Date   ALT 19 10/01/2016   AST 19 10/01/2016   ALKPHOS 57 10/01/2016   BILITOT 0.7 10/01/2016     Assessment: Fever of unknown origin.  No etiology found to date.  Differential broadly includes infectious, rheumatologic/autoimmune or hematologic/oncologic.  It seems least likely to be infectious, no significant findings on hematologic work up.  The IUD is possible but no symptoms to suggest it is causing fever.  With some vague symptoms of joint and muscle complaints, could be rheumatologic.  I discussed this with the patient and the fact that the majority of these cases, without a specific localizing symptom or finding, generally resolve on their own though can take months, even up to 1 year.     Plan: 1) labs to include repeat ESR, cmp, cbc; check RA, CCP, Quantiferon Gold 2) abdominal ultrasound 3) rtc 3-4 weeks

## 2016-12-04 LAB — COMPREHENSIVE METABOLIC PANEL
ALBUMIN: 4.1 g/dL (ref 3.6–5.1)
ALK PHOS: 51 U/L (ref 33–115)
ALT: 14 U/L (ref 6–29)
AST: 14 U/L (ref 10–30)
BILIRUBIN TOTAL: 0.2 mg/dL (ref 0.2–1.2)
BUN: 8 mg/dL (ref 7–25)
CALCIUM: 9.4 mg/dL (ref 8.6–10.2)
CO2: 26 mmol/L (ref 20–31)
Chloride: 106 mmol/L (ref 98–110)
Creat: 0.8 mg/dL (ref 0.50–1.10)
GLUCOSE: 78 mg/dL (ref 65–99)
Potassium: 4.3 mmol/L (ref 3.5–5.3)
Sodium: 140 mmol/L (ref 135–146)
Total Protein: 6.7 g/dL (ref 6.1–8.1)

## 2016-12-04 LAB — CK: Total CK: 86 U/L (ref 29–143)

## 2016-12-04 LAB — SEDIMENTATION RATE: SED RATE: 4 mm/h (ref 0–20)

## 2016-12-04 LAB — QUANTIFERON TB GOLD ASSAY (BLOOD)

## 2016-12-04 LAB — HIV ANTIBODY (ROUTINE TESTING W REFLEX): HIV 1&2 Ab, 4th Generation: NONREACTIVE

## 2016-12-04 LAB — CYCLIC CITRUL PEPTIDE ANTIBODY, IGG: Cyclic Citrullin Peptide Ab: <16 Units

## 2016-12-04 LAB — RHEUMATOID FACTOR: Rhuematoid fact SerPl-aCnc: <14 IU/mL (ref <14)

## 2016-12-05 ENCOUNTER — Ambulatory Visit (HOSPITAL_COMMUNITY)
Admission: RE | Admit: 2016-12-05 | Discharge: 2016-12-05 | Disposition: A | Discharge status: Home / Self Care | Payer: 59 | Source: Ambulatory Visit | Attending: Internal Medicine | Admitting: Internal Medicine

## 2016-12-05 DIAGNOSIS — R509 Fever, unspecified: Secondary | ICD-10-CM | POA: Diagnosis not present

## 2016-12-05 DIAGNOSIS — Z9049 Acquired absence of other specified parts of digestive tract: Secondary | ICD-10-CM | POA: Diagnosis not present

## 2016-12-26 ENCOUNTER — Ambulatory Visit (INDEPENDENT_AMBULATORY_CARE_PROVIDER_SITE_OTHER): Payer: 59 | Admitting: Internal Medicine

## 2016-12-26 ENCOUNTER — Encounter: Payer: Self-pay | Admitting: Internal Medicine

## 2016-12-26 VITALS — BP 110/73 | HR 76 | Temp 98.2°F | Ht 69.0 in | Wt 217.0 lb

## 2016-12-26 DIAGNOSIS — M1711 Unilateral primary osteoarthritis, right knee: Secondary | ICD-10-CM | POA: Diagnosis not present

## 2016-12-26 DIAGNOSIS — M13861 Other specified arthritis, right knee: Secondary | ICD-10-CM

## 2016-12-26 DIAGNOSIS — R509 Fever, unspecified: Secondary | ICD-10-CM | POA: Diagnosis not present

## 2016-12-26 IMAGING — MR MR [PERSON_NAME]*[PERSON_NAME]* W/O CM
4 of 6 series · 19 of 40 positions shown · non-contrast
Comparison: Radiographs of the right wrist and hand 07/17/2014.

CLINICAL DATA: Right hand injury 1 month ago with swelling and
decreased strength. Evaluate for ulnar nerves contusion. Initial
encounter.

EXAM:
MRI OF THE RIGHT HAND WITHOUT CONTRAST
TECHNIQUE: Multiplanar, multisequence MR imaging was performed. No intravenous
contrast was administered.

[Series 5: T2 fat-sat · axial · 4.0mm · 0.23mm/px · z∈[-29,+92]mm · 3 of 39 slices shown (1 of 2)]
[im 6/39]
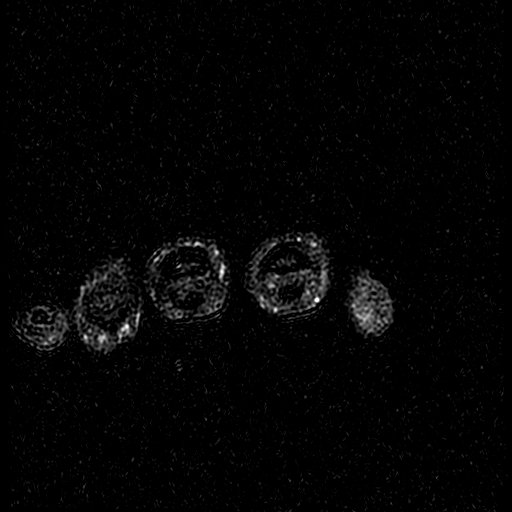
[im 22/39]
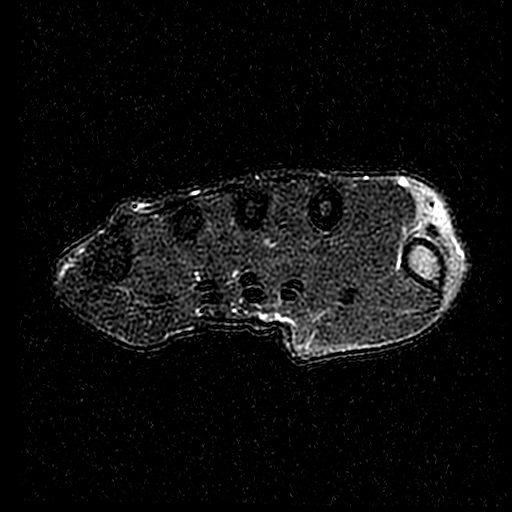
[im 33/39]
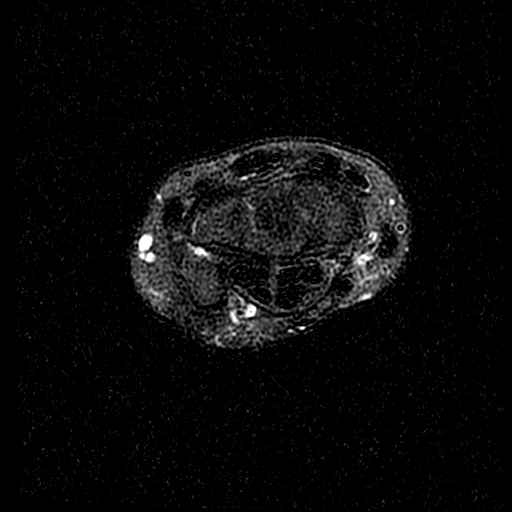

[Series 6: T2 fat-sat · coronal · 3.0mm · 0.33mm/px · 3 of 22 slices shown (2 of 2)]
[im 1/22]
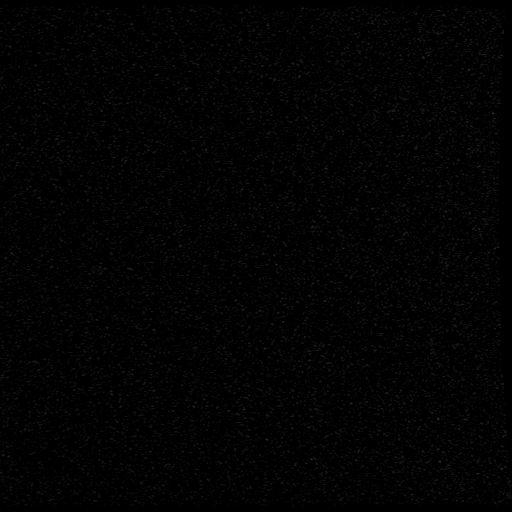
[im 11/22]
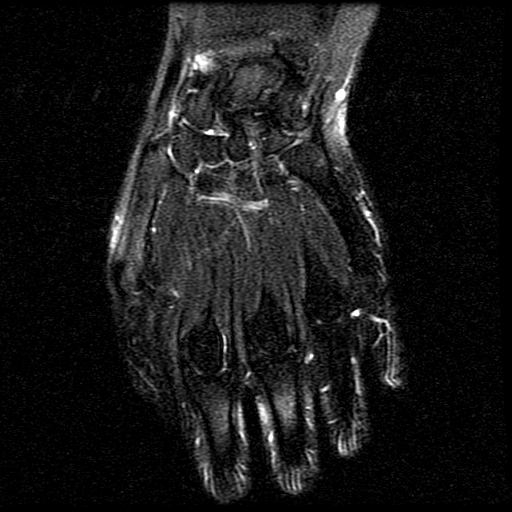
[im 22/22]
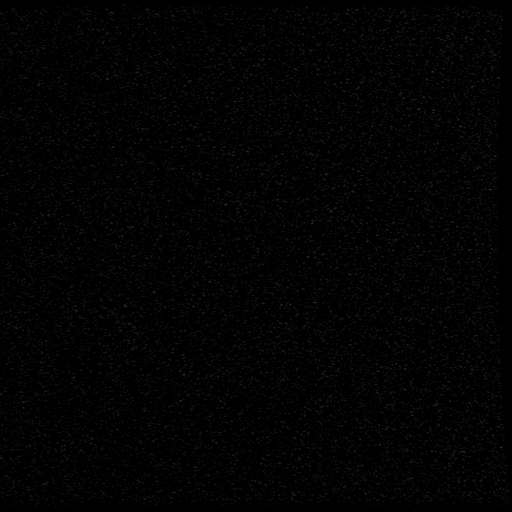

[Series 8: PD fat-sat · coronal · 3.0mm · 0.33mm/px · 5 of 22 slices shown (1 of 2)]
[im 1/22]
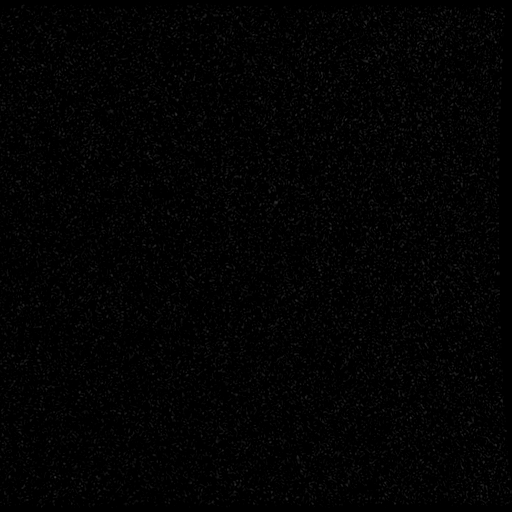
[im 6/22]
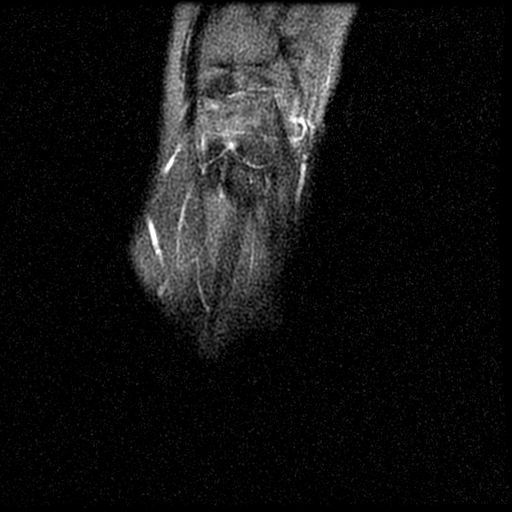
[im 11/22]
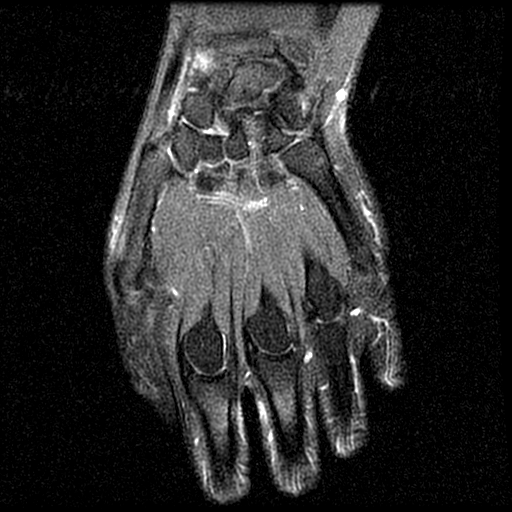
[im 16/22]
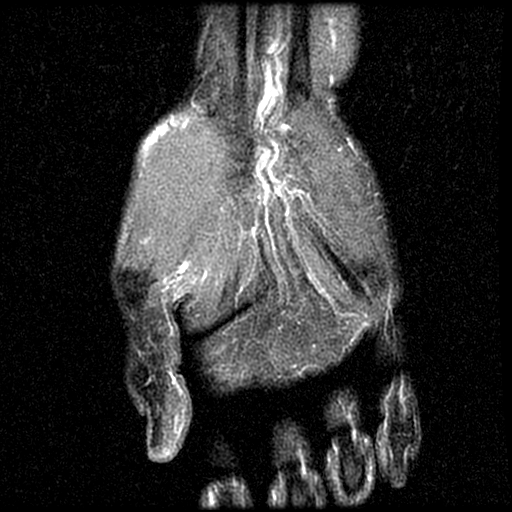
[im 22/22]
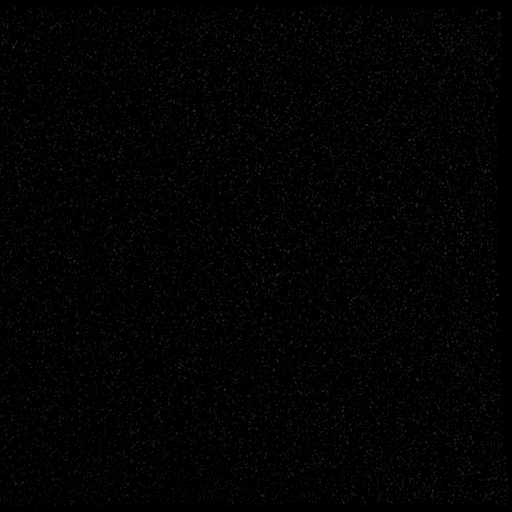

[Series 10: PD fat-sat · sagittal · 3.0mm · 0.31mm/px · 8 of 35 slices shown (2 of 2)]
[im 1/35]
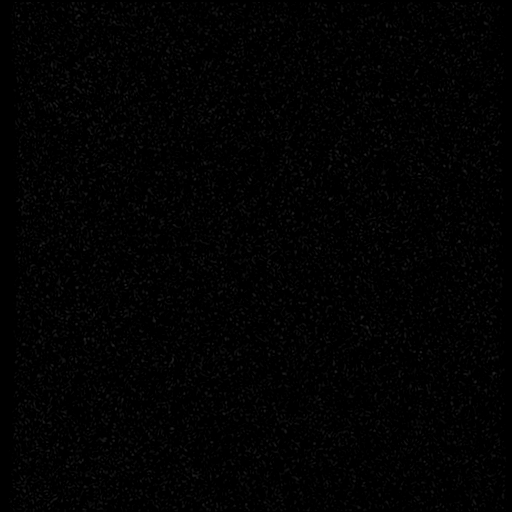
[im 5/35]
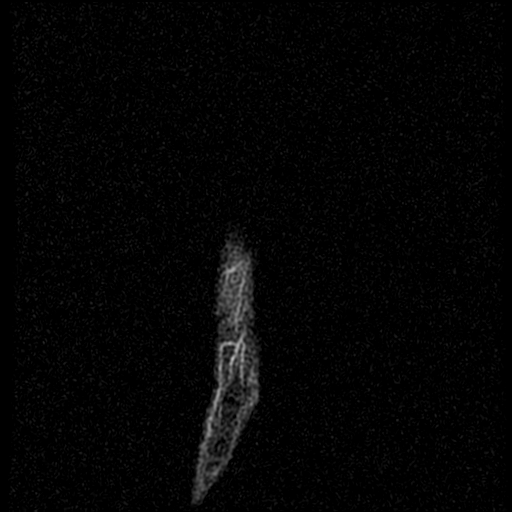
[im 10/35]
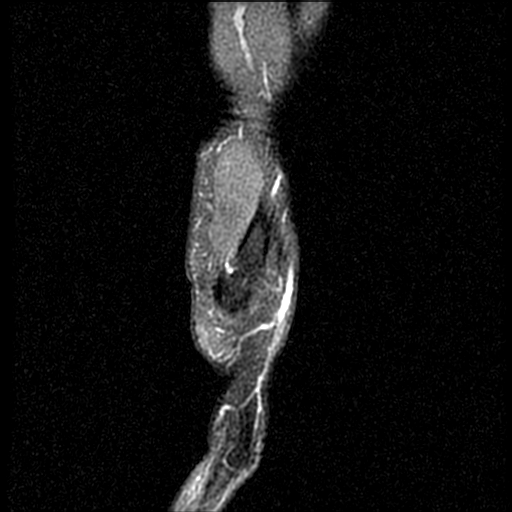
[im 15/35]
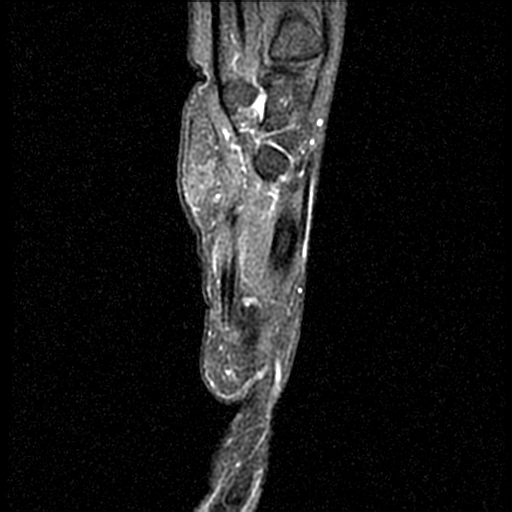
[im 20/35]
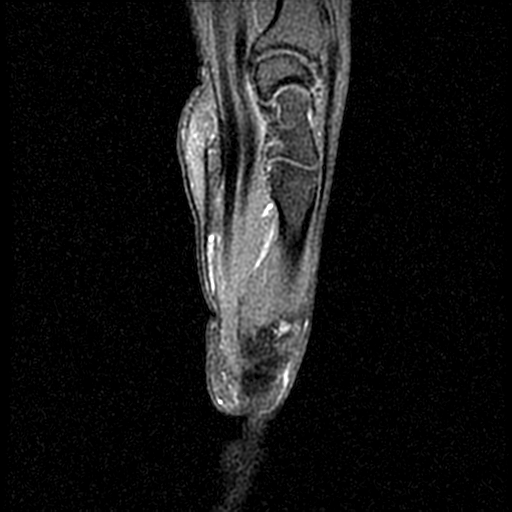
[im 25/35]
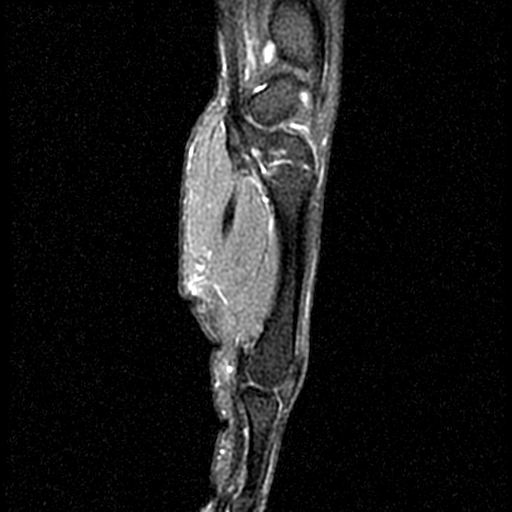
[im 30/35]
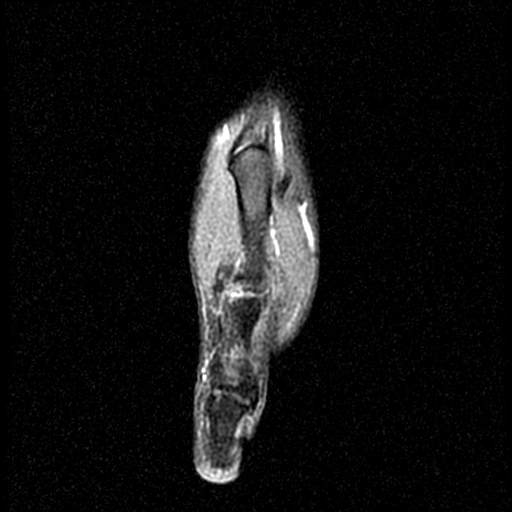
[im 35/35]
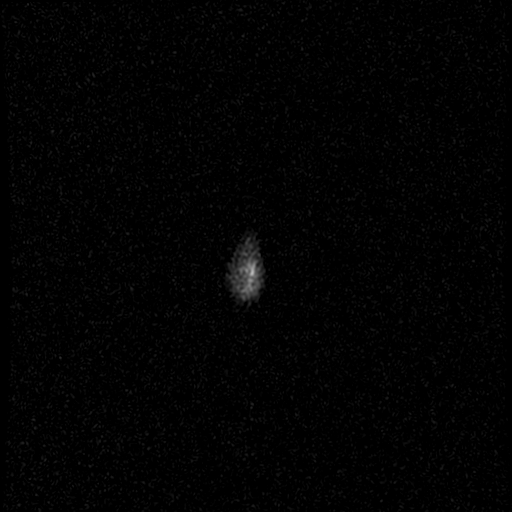

[19 of 40 positions shown; findings below may reference images not displayed]

FINDINGS: Large field-of-view imaging of the hand and wrist was performed. The
distal fingers are excluded. There is no evidence of acute fracture
or subluxation. The hamate hook is intact. The carpal bone alignment
is normal. Small bone island noted within the lunate.

Guyon's canal appears unremarkable. The ulnar nerve appears normal.
No soft tissue hematoma or denervation changes identified.

The contents of the carpal tunnel appear normal. The flexor and
extensor tendons appear intact. The intercarpal ligaments and TFCC
appear intact.
IMPRESSION: No acute findings or explanation for the patient's symptoms. The
ulnar nerve and additional contents of Guyon's canal appear normal.

## 2016-12-26 NOTE — Assessment & Plan Note (Signed)
No etiology found.  No concerning signs and labs reviewed with the patient.  I discussed that no further work up indicated unless there are changes.  She will rtc 4 weeks

## 2016-12-26 NOTE — Assessment & Plan Note (Signed)
With daily swelling, though chronic, I am going to have her see orthopedics to see if that can be cause of the fever.  ? If she needs an aspiration, ? Crystal disease

## 2016-12-26 NOTE — Progress Notes (Signed)
   Subjective:    Patient ID: Jaime Massey, female    DOB: October 30, 1975, 41 y.o.   MRN: [id number]  HPI Here for follow up of FUO. No significant change and still with some fever, though less often and now notover 101.  Still with daily knee swelling, particularly of the right knee.  She did have an accident in the past and reports she tore her meniscus.  No new symptoms otherwise with with no diarrhea, no rashes, no sob, chest pain.     Review of Systems  Constitutional: Positive for fatigue and fever. Negative for appetite change.       No weight loss  Musculoskeletal: Positive for arthralgias and joint swelling.  Skin: Negative for rash.       Objective:   Physical Exam  Constitutional: She appears well-developed and well-nourished. No distress.  Eyes: No scleral icterus.  Cardiovascular: Normal rate, regular rhythm and normal heart sounds.   No murmur heard. Pulmonary/Chest: Effort normal and breath sounds normal. No respiratory distress.  Lymphadenopathy:    She has no cervical adenopathy.  Skin: No rash noted.          Assessment & Plan:

## 2016-12-30 DIAGNOSIS — R635 Abnormal weight gain: Secondary | ICD-10-CM | POA: Diagnosis not present

## 2016-12-30 DIAGNOSIS — R509 Fever, unspecified: Secondary | ICD-10-CM | POA: Diagnosis not present

## 2017-01-16 ENCOUNTER — Encounter: Payer: Self-pay | Admitting: Orthopaedic Surgery

## 2017-01-16 ENCOUNTER — Ambulatory Visit (INDEPENDENT_AMBULATORY_CARE_PROVIDER_SITE_OTHER): Payer: 59

## 2017-01-16 ENCOUNTER — Ambulatory Visit (INDEPENDENT_AMBULATORY_CARE_PROVIDER_SITE_OTHER): Payer: 59 | Admitting: Orthopaedic Surgery

## 2017-01-16 VITALS — BP 112/69 | HR 80 | Temp 97.8°F | Ht 69.0 in | Wt 217.0 lb

## 2017-01-16 DIAGNOSIS — F17210 Nicotine dependence, cigarettes, uncomplicated: Secondary | ICD-10-CM | POA: Diagnosis not present

## 2017-01-16 DIAGNOSIS — G8929 Other chronic pain: Secondary | ICD-10-CM | POA: Diagnosis not present

## 2017-01-16 DIAGNOSIS — M25561 Pain in right knee: Secondary | ICD-10-CM

## 2017-01-16 NOTE — Patient Instructions (Addendum)
Knee Exercises Ask your health care provider which exercises are safe for you. Do exercises exactly as told by your health care provider and adjust them as directed. It is normal to feel mild stretching, pulling, tightness, or discomfort as you do these exercises, but you should stop right away if you feel sudden pain or your pain gets worse.Do not begin these exercises until told by your health care provider. STRETCHING AND RANGE OF MOTION EXERCISES These exercises warm up your muscles and joints and improve the movement and flexibility of your knee. These exercises also help to relieve pain, numbness, and tingling. Exercise A: Knee Extension, Prone 1. Lie on your abdomen on a bed. 2. Place your left / right knee just beyond the edge of the surface so your knee is not on the bed. You can put a towel under your left / right thigh just above your knee for comfort. 3. Relax your leg muscles and allow gravity to straighten your knee. You should feel a stretch behind your left / right knee. 4. Hold this position for __________ seconds. 5. Scoot up so your knee is supported between repetitions. Repeat __________ times. Complete this stretch __________ times a day. Exercise B: Knee Flexion, Active  1. Lie on your back with both knees straight. If this causes back discomfort, bend your left / right knee so your foot is flat on the floor. 2. Slowly slide your left / right heel back toward your buttocks until you feel a gentle stretch in the front of your knee or thigh. 3. Hold this position for __________ seconds. 4. Slowly slide your left / right heel back to the starting position. Repeat __________ times. Complete this exercise __________ times a day. Exercise C: Quadriceps, Prone  1. Lie on your abdomen on a firm surface, such as a bed or padded floor. 2. Bend your left / right knee and hold your ankle. If you cannot reach your ankle or pant leg, loop a belt around your foot and grab the belt  instead. 3. Gently pull your heel toward your buttocks. Your knee should not slide out to the side. You should feel a stretch in the front of your thigh and knee. 4. Hold this position for __________ seconds. Repeat __________ times. Complete this stretch __________ times a day. Exercise D: Hamstring, Supine 1. Lie on your back. 2. Loop a belt or towel over the ball of your left / right foot. The ball of your foot is on the walking surface, right under your toes. 3. Straighten your left / right knee and slowly pull on the belt to raise your leg until you feel a gentle stretch behind your knee. ? Do not let your left / right knee bend while you do this. ? Keep your other leg flat on the floor. 4. Hold this position for __________ seconds. Repeat __________ times. Complete this stretch __________ times a day. STRENGTHENING EXERCISES These exercises build strength and endurance in your knee. Endurance is the ability to use your muscles for a Jaime Massey time, even after they get tired. Exercise E: Quadriceps, Isometric  1. Lie on your back with your left / right leg extended and your other knee bent. Put a rolled towel or small pillow under your knee if told by your health care provider. 2. Slowly tense the muscles in the front of your left / right thigh. You should see your kneecap slide up toward your hip or see increased dimpling just above the knee. This  motion will push the back of the knee toward the floor. 3. For __________ seconds, keep the muscle as tight as you can without increasing your pain. 4. Relax the muscles slowly and completely. Repeat __________ times. Complete this exercise __________ times a day. Exercise F: Straight Leg Raises - Quadriceps 1. Lie on your back with your left / right leg extended and your other knee bent. 2. Tense the muscles in the front of your left / right thigh. You should see your kneecap slide up or see increased dimpling just above the knee. Your thigh may  even shake a bit. 3. Keep these muscles tight as you raise your leg 4-6 inches (10-15 cm) off the floor. Do not let your knee bend. 4. Hold this position for __________ seconds. 5. Keep these muscles tense as you lower your leg. 6. Relax your muscles slowly and completely after each repetition. Repeat __________ times. Complete this exercise __________ times a day. Exercise G: Hamstring, Isometric 1. Lie on your back on a firm surface. 2. Bend your left / right knee approximately __________ degrees. 3. Dig your left / right heel into the surface as if you are trying to pull it toward your buttocks. Tighten the muscles in the back of your thighs to dig as hard as you can without increasing any pain. 4. Hold this position for __________ seconds. 5. Release the tension gradually and allow your muscles to relax completely for __________ seconds after each repetition. Repeat __________ times. Complete this exercise __________ times a day. Exercise H: Hamstring Curls  If told by your health care provider, do this exercise while wearing ankle weights. Begin with __________ weights. Then increase the weight by 1 lb (0.5 kg) increments. Do not wear ankle weights that are more than __________. 1. Lie on your abdomen with your legs straight. 2. Bend your left / right knee as far as you can without feeling pain. Keep your hips flat against the floor. 3. Hold this position for __________ seconds. 4. Slowly lower your leg to the starting position.  Repeat __________ times. Complete this exercise __________ times a day. Exercise I: Squats (Quadriceps) 1. Stand in front of a table, with your feet and knees pointing straight ahead. You may rest your hands on the table for balance but not for support. 2. Slowly bend your knees and lower your hips like you are going to sit in a chair. ? Keep your weight over your heels, not over your toes. ? Keep your lower legs upright so they are parallel with the table  legs. ? Do not let your hips go lower than your knees. ? Do not bend lower than told by your health care provider. ? If your knee pain increases, do not bend as low. 3. Hold the squat position for __________ seconds. 4. Slowly push with your legs to return to standing. Do not use your hands to pull yourself to standing. Repeat __________ times. Complete this exercise __________ times a day. Exercise J: Wall Slides (Quadriceps)  1. Lean your back against a smooth wall or door while you walk your feet out 18-24 inches (46-61 cm) from it. 2. Place your feet hip-width apart. 3. Slowly slide down the wall or door until your knees bend __________ degrees. Keep your knees over your heels, not over your toes. Keep your knees in line with your hips. 4. Hold for __________ seconds. Repeat __________ times. Complete this exercise __________ times a day. Exercise K: Straight Leg Raises -  Hip Abductors 1. Lie on your side with your left / right leg in the top position. Lie so your head, shoulder, knee, and hip line up. You may bend your bottom knee to help you keep your balance. 2. Roll your hips slightly forward so your hips are stacked directly over each other and your left / right knee is facing forward. 3. Leading with your heel, lift your top leg 4-6 inches (10-15 cm). You should feel the muscles in your outer hip lifting. ? Do not let your foot drift forward. ? Do not let your knee roll toward the ceiling. 4. Hold this position for __________ seconds. 5. Slowly return your leg to the starting position. 6. Let your muscles relax completely after each repetition. Repeat __________ times. Complete this exercise __________ times a day. Exercise L: Straight Leg Raises - Hip Extensors 1. Lie on your abdomen on a firm surface. You can put a pillow under your hips if that is more comfortable. 2. Tense the muscles in your buttocks and lift your left / right leg about 4-6 inches (10-15 cm). Keep your knee  straight as you lift your leg. 3. Hold this position for __________ seconds. 4. Slowly lower your leg to the starting position. 5. Let your leg relax completely after each repetition. Repeat __________ times. Complete this exercise __________ times a day. This information is not intended to replace advice given to you by your health care provider. Make sure you discuss any questions you have with your health care provider. Document Released: 05/08/2005 Document Revised: 03/18/2016 Document Reviewed: 04/30/2015 Elsevier Interactive Patient Education  2018 [employer].  Steps to Quit Smoking Smoking tobacco can be bad for your health. It can also affect almost every organ in your body. Smoking puts you and people around you at risk for many serious Sir [place]is-lasting (chronic) diseases. Quitting smoking is hard, but it is one of the best things that you can do for your health. It is never too late to quit. What are the benefits of quitting smoking? When you quit smoking, you lower your risk for getting serious diseases and conditions. They can include:  Lung cancer or lung disease.  Heart disease.  Stroke.  Heart attack.  Not being able to have children (infertility).  Weak bones (osteoporosis) and broken bones (fractures).  If you have coughing, wheezing, and shortness of breath, those symptoms may get better when you quit. You may also get sick less often. If you are pregnant, quitting smoking can help to lower your chances of having a baby of low birth weight. What can I do to help me quit smoking? Talk with your doctor about what can help you quit smoking. Some things you can do (strategies) include:  Quitting smoking totally, instead of slowly cutting back how much you smoke over a period of time.  Going to in-person counseling. You are more likely to quit if you go to many counseling sessions.  Using resources and support systems, such as: ? Database administrator with a Social worker. ? Phone  quitlines. ? Careers information officer. ? Support groups or group counseling. ? Text messaging programs. ? Mobile phone apps or applications.  Taking medicines. Some of these medicines may have nicotine in them. If you are pregnant or breastfeeding, do not take any medicines to quit smoking unless your doctor says it is okay. Talk with your doctor about counseling or other things that can help you.  Talk with your doctor about using more than one strategy  at the same time, such as taking medicines while you are also going to in-person counseling. This can help make quitting easier. What things can I do to make it easier to quit? Quitting smoking might feel very hard at first, but there is a lot that you can do to make it easier. Take these steps:  Talk to your family and friends. Ask them to support and encourage you.  Call phone quitlines, reach out to support groups, or work with a Social worker.  Ask people who smoke to not smoke around you.  Avoid places that make you want (trigger) to smoke, such as: ? Bars. ? Parties. ? Smoke-break areas at work.  Spend time with people who do not smoke.  Lower the stress in your life. Stress can make you want to smoke. Try these things to help your stress: ? Getting regular exercise. ? Deep-breathing exercises. ? Yoga. ? Meditating. ? Doing a body scan. To do this, close your eyes, focus on one area of your body at a time from head to toe, and notice which parts of your body are tense. Try to relax the muscles in those areas.  Download or buy apps on your mobile phone or tablet that can help you stick to your quit plan. There are many free apps, such as QuitGuide from the [employer] Office manager for Disease Control and Prevention). You can find more support from smokefree.gov and other websites.  This information is not intended to replace advice given to you by your health care provider. Make sure you discuss any questions you have with your health care  provider. Document Released: 04/20/2009 Document Revised: 02/20/2016 Document Reviewed: 11/08/2014 Elsevier Interactive Patient Education  2018 [employer].
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
## 2017-01-21 ENCOUNTER — Ambulatory Visit (HOSPITAL_COMMUNITY)
Admission: RE | Admit: 2017-01-21 | Discharge: 2017-01-21 | Disposition: A | Discharge status: Home / Self Care | Payer: 59 | Source: Ambulatory Visit | Attending: Orthopaedic Surgery | Admitting: Orthopaedic Surgery

## 2017-01-21 DIAGNOSIS — S83241A Other tear of medial meniscus, current injury, right knee, initial encounter: Secondary | ICD-10-CM | POA: Insufficient documentation

## 2017-01-21 DIAGNOSIS — M25561 Pain in right knee: Secondary | ICD-10-CM | POA: Diagnosis not present

## 2017-01-21 DIAGNOSIS — X58XXXA Exposure to other specified factors, initial encounter: Secondary | ICD-10-CM | POA: Diagnosis not present

## 2017-01-21 DIAGNOSIS — M7989 Other specified soft tissue disorders: Secondary | ICD-10-CM | POA: Diagnosis not present

## 2017-01-21 DIAGNOSIS — G8929 Other chronic pain: Secondary | ICD-10-CM | POA: Insufficient documentation

## 2017-01-23 ENCOUNTER — Encounter: Payer: Self-pay | Admitting: Orthopaedic Surgery

## 2017-01-23 ENCOUNTER — Ambulatory Visit (INDEPENDENT_AMBULATORY_CARE_PROVIDER_SITE_OTHER): Payer: 59 | Admitting: Orthopaedic Surgery

## 2017-01-23 VITALS — BP 104/73 | Temp 97.1°F | Ht 69.0 in | Wt 215.0 lb

## 2017-01-23 DIAGNOSIS — M25561 Pain in right knee: Secondary | ICD-10-CM | POA: Diagnosis not present

## 2017-01-23 DIAGNOSIS — G8929 Other chronic pain: Secondary | ICD-10-CM

## 2017-01-23 NOTE — Progress Notes (Signed)
Patient Jaime Massey, female DOB:1975-10-26, 41 y.o. PXT:[id number]  Chief Complaint  Patient presents with  . Follow-up    Recheck on right knee and review MRI.    HPI  Jaime Massey is a 41 y.o. female who has pain of the right knee.  She had MRI of the knee which showed: IMPRESSION: 1. Inferior articular surfacing tears of the posterior horn of the medial meniscus and anterior horn of the lateral meniscus. 2. Intact cruciate and collateral ligaments. 3. Mild thinning of the patellar and medial femorotibial cartilage.  I have explained the findings to her.  I feel she may need arthroscopy of the knee.  I will have Dr. Aline Brochure see her.  She is agreeable to this.  HPI  Body mass index is 31.75 kg/m.  ROS  Review of Systems  HENT: Negative for congestion.   Respiratory: Negative for cough and shortness of breath.   Cardiovascular: Negative for chest pain and leg swelling.  Endocrine: Positive for cold intolerance.  Musculoskeletal: Positive for arthralgias, gait problem and joint swelling.  Allergic/Immunologic: Positive for environmental allergies.    Past Medical History:  Diagnosis Date  . Arthritis   . DVT (deep venous thrombosis) ([place])   . PE (pulmonary embolism)   . Seizures ([place])    as child from severe ear infections- no meds now and no seizures since age 67    Past Surgical History:  Procedure Laterality Date  . CESAREAN SECTION    . CHOLECYSTECTOMY N/A 12/21/2012   Procedure: LAPAROSCOPIC CHOLECYSTECTOMY;  Surgeon: Donato Heinz, MD;  Location: [facility];  Service: General;  Laterality: N/A;    Family History  Problem Relation Age of Onset  . Cancer Maternal Grandmother   . Cancer Maternal Grandfather   . Diabetes Paternal Grandmother   . Heart disease Paternal Grandfather     Social History Social History  Substance Use Topics  . Smoking status: Current Every Day Smoker    Packs/day: 0.50    Years: 26.00    Types: Cigarettes  . Smokeless  tobacco: Never Used  . Alcohol use No     Comment: reports occasional beer but very seldom    Allergies  Allergen Reactions  . Penicillins Rash    Current Outpatient Prescriptions  Medication Sig Dispense Refill  . acetaminophen (TYLENOL) 325 MG tablet Take 650 mg by mouth every 6 (six) hours as needed.    Marland Kitchen buPROPion (WELLBUTRIN SR) 100 MG 12 hr tablet     . diclofenac (VOLTAREN) 75 MG EC tablet Take 1 tablet (75 mg total) by mouth 2 (two) times daily. (Patient not taking: Reported on 01/16/2017) 14 tablet 0  . ibuprofen (ADVIL,MOTRIN) 200 MG tablet Take 200 mg by mouth every 6 (six) hours as needed.    . Naproxen Sodium 220 MG CAPS Take 440 mg by mouth 2 (two) times daily as needed (Pain).     No current facility-administered medications for this visit.      Physical Exam  Blood pressure 104/73, temperature (!) 97.1 F (36.2 C), height 5' 9"  (1.753 m), weight 215 lb (97.5 kg).  Constitutional: overall normal hygiene, normal nutrition, well developed, normal grooming, normal body habitus. Assistive device:none  Musculoskeletal: gait and station Limp right, muscle tone and strength are normal, no tremors or atrophy is present.  .  Neurological: coordination overall normal.  Deep tendon reflex/nerve stretch intact.  Sensation normal.  Cranial nerves II-XII intact.   Skin:   Normal overall no scars, lesions,  ulcers or rashes. No psoriasis.  Psychiatric: Alert and oriented x 3.  Recent memory intact, remote memory unclear.  Normal mood and affect. Well groomed.  Good eye contact.  Cardiovascular: overall no swelling, no varicosities, no edema bilaterally, normal temperatures of the legs and arms, no clubbing, cyanosis and good capillary refill.  Lymphatic: palpation is normal.  The right lower extremity is examined:  Inspection:  Thigh:  Non-tender and no defects  Knee has swelling 1+ effusion.                        Joint tenderness is present                         Patient is tender over the medial joint line  Lower Leg:  Has normal appearance and no tenderness or defects  Ankle:  Non-tender and no defects  Foot:  Non-tender and no defects Range of Motion:  Knee:  Range of motion is: 0-115                        Crepitus is  present  Ankle:  Range of motion is normal. Strength and Tone:  The right lower extremity has normal strength and tone. Stability:  Knee:  The knee has positive medial McMurray.  Ankle:  The ankle is stable.    The patient has been educated about the nature of the problem(s) and counseled on treatment options.  The patient appeared to understand what I have discussed and is in agreement with it.  Encounter Diagnosis  Name Primary?  . Chronic pain of right knee Yes    PLAN Call if any problems.  Precautions discussed.  Continue current medications.   Return to clinic to see Dr. Aline Brochure   .wx

## 2017-01-29 ENCOUNTER — Ambulatory Visit (INDEPENDENT_AMBULATORY_CARE_PROVIDER_SITE_OTHER): Payer: 59 | Admitting: Orthopedic Surgery

## 2017-01-29 ENCOUNTER — Encounter: Payer: Self-pay | Admitting: Orthopedic Surgery

## 2017-01-29 VITALS — Ht 69.0 in | Wt 215.0 lb

## 2017-01-29 DIAGNOSIS — M23300 Other meniscus derangements, unspecified lateral meniscus, right knee: Secondary | ICD-10-CM | POA: Diagnosis not present

## 2017-01-29 DIAGNOSIS — M1711 Unilateral primary osteoarthritis, right knee: Secondary | ICD-10-CM

## 2017-01-29 DIAGNOSIS — M23321 Other meniscus derangements, posterior horn of medial meniscus, right knee: Secondary | ICD-10-CM | POA: Diagnosis not present

## 2017-01-29 NOTE — Progress Notes (Signed)
Preop appointment   Chief Complaint  Patient presents with  . Follow-up    Referral fro Dr. Luna Glasgow for right knee to review for surgery.    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 She did have an MRI she has a torn medial and lateral meniscus and patellofemoral and medial compartment arthritis  One of her other complaints is that the knee is making noise    Review of Systems  Constitutional: Negative for fever.  Skin: Negative for rash.  Neurological: Negative for tingling.  Endo/Heme/Allergies: Does not bruise/bleed easily.  All other systems reviewed and are negative.    Past Medical History:  Diagnosis Date  . Arthritis   . DVT (deep venous thrombosis) ([place])   . PE (pulmonary embolism)   . Seizures ([place])    as child from severe ear infections- no meds now and no seizures since age 42    Past Surgical History:  Procedure Laterality Date  . CESAREAN SECTION    . CHOLECYSTECTOMY N/A 12/21/2012   Procedure: LAPAROSCOPIC CHOLECYSTECTOMY;  Surgeon: Donato Heinz, MD;  Location: [facility];  Service: General;  Laterality: N/A;    Family History  Problem Relation Age of Onset  . Cancer Maternal Grandmother   . Cancer Maternal Grandfather   . Diabetes Paternal Grandmother   . Heart disease Paternal Grandfather    Social History  Substance Use Topics  . Smoking status: Current Every Day Smoker    Packs/day: 0.50    Years: 26.00    Types: Cigarettes  . Smokeless tobacco: Never Used  . Alcohol use No     Comment: reports occasional beer but very seldom    Ht 5' 9"  (1.753 m)   Wt 215 lb (97.5 kg)   BMI 31.75 kg/m   Physical Exam  Constitutional: She is oriented to person, place, and time. She  appears well-developed and well-nourished.  Musculoskeletal:       Right knee: Medial joint line and lateral joint line tenderness noted.  Neurological: She is alert and oriented to person, place, and time. Gait abnormal.  Psychiatric: She has a normal mood and affect.  Vitals reviewed.   Left Knee Exam   Tenderness  None  Range of Motion  Normal left knee ROM  Muscle Strength  Normal left knee strength  Tests  Drawer:       Anterior - Negative       Comments:  Normal neurovascular exam  Right Knee Exam   Tenderness  The patient is experiencing tenderness in the medial joint line, lateral joint line.    Plain films I reviewed they don't show any major change in the knee  Her MRI report was read as   IMPRESSION: 1. Inferior articular surfacing tears of the posterior horn of the medial meniscus and anterior horn of the lateral meniscus. 2. Intact cruciate and collateral ligaments. 3. Mild thinning of the patellar and medial femorotibial cartilage.     Electronically Signed   By: Ashley Royalty M.D.   On: 01/21/2017 19:27  I do see the chondral thinning I see the anterior horn lateral meniscal abnormality which is consistent with tear in the posterior horn medial meniscus which is more consistent with tear   Encounter  Diagnoses  Name Primary?  . Derangement of posterior horn of medial meniscus of right knee Yes  . Meniscus, lateral, derangement, right   . Primary osteoarthritis of right knee      PLAN:   Arthroscopy right knee medial and lateral meniscectomy  This procedure has been fully reviewed with the patient and written informed consent has been obtained. She understands that the knee will probably still make noise and still give her some pain but her knee should improve with surgery

## 2017-01-29 NOTE — Patient Instructions (Addendum)
Need OOW x 3 weeks from surgery      Meniscus Tear A meniscus tear is a knee injury in which a piece of the meniscus is torn. The meniscus is a thick, rubbery, wedge-shaped cartilage in the knee. Two menisci are located in each knee. They sit between the upper bone (femur) and lower bone (tibia) that make up the knee joint. Each meniscus acts as a shock absorber for the knee. A torn meniscus is one of the most common types of knee injuries. This injury can range from mild to severe. Surgery may be needed for a severe tear. What are the causes? This injury may be caused by any squatting, twisting, or pivoting movement. Sports-related injuries are the most common cause. These often occur from:  Running and stopping suddenly.  Changing direction.  Being tackled or knocked off your feet.  As people get older, their meniscus gets thinner and weaker. In these people, tears can happen more easily, such as from climbing stairs. What increases the risk? This injury is more likely to happen to:  People who play contact sports.  Males.  People who are 71?40 years of age.  What are the signs or symptoms? Symptoms of this injury include:  Knee pain, especially at the side of the knee joint. You may feel pain when the injury occurs, or you may only hear a pop and feel pain later.  A feeling that your knee is clicking, catching, locking, or giving way.  Not being able to fully bend or extend your knee.  Bruising or swelling in your knee.  How is this diagnosed? This injury may be diagnosed based on your symptoms and a physical exam. The physical exam may include:  Moving your knee in different ways.  Feeling for tenderness.  Listening for a clicking sound.  Checking if your knee locks or catches.  You may also have tests, such as:  X-rays.  MRI.  A procedure to look inside your knee with a narrow surgical telescope (arthroscopy).  You may be referred to a knee specialist  (orthopedic surgeon). How is this treated? Treatment for this injury depends on the severity of the tear. Treatment for a mild tear may include:  Rest.  Medicine to reduce pain and swelling. This is usually a nonsteroidal anti-inflammatory drug (NSAID).  A knee brace or an elastic sleeve or wrap.  Using crutches or a walker to keep weight off your knee and to help you walk.  Exercises to strengthen your knee (physical therapy).  You may need surgery if you have a severe tear or if other treatments are not working. Follow these instructions at home: Managing pain and swelling  Take over-the-counter and prescription medicines only as told by your health care provider.  If directed, apply ice to the injured area: ? Put ice in a plastic bag. ? Place a towel between your skin and the bag. ? Leave the ice on for 20 minutes, 2-3 times per day.  Raise (elevate) the injured area above the level of your heart while you are sitting or lying down. Activity  Do not use the injured limb to support your body weight until your health care provider says that you can. Use crutches or a walker as told by your health care provider.  Return to your normal activities as told by your health care provider. Ask your health care provider what activities are safe for you.  Perform range-of-motion exercises only as told by your health care provider.  Begin doing exercises to strengthen your knee and leg muscles only as told by your health care provider. After you recover, your health care provider may recommend these exercises to help prevent another injury. General instructions  Use a knee brace or elastic wrap as told by your health care provider.  Keep all follow-up visits as told by your health care provider. This is important. Contact a health care provider if:  You have a fever.  Your knee becomes red, tender, or swollen.  Your pain medicine is not helping.  Your symptoms get worse or do  not improve after 2 weeks of home care. This information is not intended to replace advice given to you by your health care provider. Make sure you discuss any questions you have with your health care provider. Document Released: 09/14/2002 Document Revised: 11/30/2015 Document Reviewed: 10/17/2014 Elsevier Interactive Patient Education  2018 [place]. Knee Arthroscopy Knee arthroscopy is a surgical procedure that is used to examine the inside of your knee joint and repair any damage. The surgeon puts a small, lighted instrument with a camera on the tip (arthroscope) through a small incision in your knee. The camera sends pictures to a monitor in the operating room. Your surgeon uses those pictures to guide the surgical instruments through other incisions to the area of damage. Knee arthroscopy can be used to treat many types of knee problems. It may be used:  To repair a torn ligament.  To repair or remove damaged tissue.  To remove a fluid-filled sac (cyst) from your knee.  Tell a health care provider about:  Any allergies you have.  All medicines you are taking, including vitamins, herbs, eye drops, creams, and over-the-counter medicines.  Any problems you or family members have had with anesthetic medicines.  Any blood disorders you have.  Any surgeries you have had.  Any medical conditions you have. What are the risks? Generally, this is a safe procedure. However, problems may occur, including:  Infection.  Bleeding.  Damage to blood vessels, nerves, or structures of your knee.  A blood clot that forms in your leg and travels to your lung.  Failure to relieve symptoms.  What happens before the procedure?  Ask your health care provider about: ? Changing or stopping your regular medicines. This is especially important if you are taking diabetes medicines or blood thinners. ? Taking medicines such as aspirin and ibuprofen. These medicines can thin your blood. Do not  take these medicines before your procedure if your health care provider instructs you not to.  Follow your health care provider's instructions about eating or drinking restrictions.  Plan to have someone take you home after the procedure.  If you go home right after the procedure, plan to have someone with you for 24 hours.  Do not drink alcohol unless your health care provider says that you can.  Do not use any tobacco products, including cigarettes, chewing tobacco, or electronic cigarettes unless your health care provider says that you can. If you need help quitting, ask your health care provider.  You may have a physical exam. What happens during the procedure?  An IV tube will be inserted into one of your veins.  You will be given one or more of the following: ? A medicine that helps you relax (sedative). ? A medicine that numbs the area (local anesthetic). ? A medicine that makes you fall asleep (general anesthetic). ? A medicine that is injected into your spine that numbs the area  below and slightly above the injection site (spinal anesthetic). ? A medicine that is injected into an area of your body that numbs everything below the injection site (regional anesthetic).  A cuff may be placed around your upper leg to slow bleeding during the procedure.  The surgeon will make a small number of incisions around your knee.  Your knee joint will be flushed and filled with a germ-free (sterile) solution.  The arthroscope will be passed through an incision into your knee joint.  More instruments will be passed through other incisions to repair your knee as needed.  The fluid will be removed from your knee.  The incisions will be closed with adhesive strips or stitches (sutures).  A bandage (dressing) will be placed over your knee. The procedure may vary among health care providers and hospitals. What happens after the procedure?  Your blood pressure, heart rate, breathing rate  and blood oxygen level will be monitored often until the medicines you were given have worn off.  You may be given medicine for pain.  You may get crutches to help you walk without using your knee to support your body weight.  You may have to wear compression stockings. These stocking help to prevent blood clots and reduce swelling in your legs. This information is not intended to replace advice given to you by your health care provider. Make sure you discuss any questions you have with your health care provider. Document Released: 06/21/2000 Document Revised: 11/30/2015 Document Reviewed: 06/20/2014 Elsevier Interactive Patient Education  2017 [employer].

## 2017-02-14 MED FILL — BUPROPION HCL SR 100 MG TAB: 100 | 90 days supply | Qty: 180 | Fill #0

## 2017-02-14 MED FILL — traZODone HCL 50 MG TABS: 50 | 90 days supply | Qty: 180 | Fill #0

## 2017-02-15 DIAGNOSIS — R509 Fever, unspecified: Secondary | ICD-10-CM | POA: Insufficient documentation

## 2017-02-15 DIAGNOSIS — F4322 Adjustment disorder with anxiety: Secondary | ICD-10-CM | POA: Insufficient documentation

## 2017-02-15 DIAGNOSIS — G4452 New daily persistent headache (NDPH): Secondary | ICD-10-CM | POA: Insufficient documentation

## 2017-02-15 DIAGNOSIS — D72825 Bandemia: Secondary | ICD-10-CM | POA: Insufficient documentation

## 2017-02-15 DIAGNOSIS — I2699 Other pulmonary embolism without acute cor pulmonale: Secondary | ICD-10-CM | POA: Insufficient documentation

## 2017-02-19 NOTE — Patient Instructions (Signed)
Jaime Massey  02/19/2017     @PREFPERIOPPHARMACY @   Your procedure is scheduled on  03/05/2017 .  Report to [facility] at  615  A.M.  Call this number if you have problems the morning of surgery:  [phone]   Remember:  Do not eat food or drink liquids after midnight.  Take these medicines the morning of surgery with A SIP OF WATER  Wellbutrin, voltaren.   Do not wear jewelry, make-up or nail polish.  Do not wear lotions, powders, or perfumes, or deoderant.  Do not shave 48 hours prior to surgery.  Men may shave face and neck.  Do not bring valuables to the hospital.  [facility] is not responsible for any belongings or valuables.  Contacts, dentures or bridgework may not be worn into surgery.  Leave your suitcase in the car.  After surgery it may be brought to your room.  For patients admitted to the hospital, discharge time will be determined by your treatment team.  Patients discharged the day of surgery will not be allowed to drive home.   Name and phone number of your driver:   family Special instructions:  None  Please read over the following fact sheets that you were given. Anesthesia Post-op Instructions and Care and Recovery After Surgery      Knee Ligament Injury, Arthroscopy Arthroscopy is a surgical technique in which your health care provider examines your knee through a small, pencil-sized telescope (arthroscope). Often, repairs to injured ligaments can be done with instruments in the arthroscope. Arthroscopy is less invasive than open-knee surgery. Tell a health care provider about:  Any allergies you have.  All medicines you are taking, including vitamins, herbs, eye drops, creams, and over-the-counter medicines.  Any problems you or family members have had with anesthetic medicines.  Any blood disorders you have.  Any surgeries you have had.  Any medical conditions you have. What are the risks? Generally, this is a safe  procedure. However, as with any procedure, problems can occur. Possible problems include:  Infection.  Bleeding.  Stiffness.  What happens before the procedure?  Ask your health care provider about changing or stopping any regular medicines. Avoid taking aspirin or blood thinners as directed by your health care provider.  Do not eat or drink anything after midnight the night before surgery.  If you smoke, do not smoke for at least 2 weeks before your surgery.  Do not drink alcohol starting the day before your surgery.  Let your health care provider know if you develop a cold or any infection before your surgery.  Arrange for someone to drive you home after the surgery or after your hospital stay. Also arrange for someone to help you with activities during recovery. What happens during the procedure?  Small monitors will be put on your body. They are used to check your heart, blood pressure, and oxygen levels.  An IV access tube will be put into one of your veins. Medicine will be able to flow directly into your body through this IV tube.  You might be given a medicine to help you relax (sedative).  You will be given a medicine that makes you go to sleep (general anesthetic), and a breathing tube will be placed into your lungs during the procedure.  Several small incisions are made in your knee. Saline fluid is placed into one of the incisions to expand the knee and clear away any  blood in the knee.  Your health care provider will insert the arthroscope to examine the injured knee.  During arthroscopy, your health care provider may find a partial or complete tear in a ligament.  Tools can be inserted through the other incisions to repair the injured ligaments.  The incisions are then closed with absorbable stitches and covered with dressings. What happens after the procedure?  You will be taken to the recovery area where you will be monitored.  When you are awake, stable,  and taking fluids without problems, you will be allowed to go home. This information is not intended to replace advice given to you by your health care provider. Make sure you discuss any questions you have with your health care provider. Document Released: 06/21/2000 Document Revised: 11/30/2015 Document Reviewed: 02/03/2013 Elsevier Interactive Patient Education  2017 [place].  Arthroscopic Knee Ligament Repair, Care After This sheet gives you information about how to care for yourself after your procedure. Your health care provider may also give you more specific instructions. If you have problems or questions, contact your health care provider. What can I expect after the procedure? After the procedure, it is common to have:  Pain in your knee.  Bruising and swelling on your knee, calf, and ankle for 3-4 days.  Fatigue.  Follow these instructions at home: If you have a brace or immobilizer:  Wear the brace or immobilizer as told by your health care provider. Remove it only as told by your health care provider.  Loosen the splint or immobilizer if your toes tingle, become numb, or turn cold and blue.  Keep the brace or immobilizer clean. Bathing  Do not take baths, swim, or use a hot tub until your health care provider approves. Ask your health care provider if you can take showers.  Keep your bandage (dressing) dry until your health care provider says that it can be removed. Cover it and your brace or immobilizer with a watertight covering when you take a shower. Incision care  Follow instructions from your health care provider about how to take care of your incision. Make sure you: ? Wash your hands with soap and water before you change your bandage (dressing). If soap and water are not available, use hand sanitizer. ? Change your dressing as told by your health care provider. ? Leave stitches (sutures), skin glue, or adhesive strips in place. These skin closures may need  to stay in place for 2 weeks or longer. If adhesive strip edges start to loosen and curl up, you may trim the loose edges. Do not remove adhesive strips completely unless your health care provider tells you to do that.  Check your incision area every day for signs of infection. Check for: ? More redness, swelling, or pain. ? More fluid or blood. ? Warmth. ? Pus or a bad smell. Managing pain, stiffness, and swelling  If directed, put ice on the affected area. ? If you have a removable brace or immobilizer, remove it as told by your health care provider. ? Put ice in a plastic bag. ? Place a towel between your skin and the bag or between your brace or immobilizer and the bag. ? Leave the ice on for 20 minutes, 2-3 times a day.  Move your toes often to avoid stiffness and to lessen swelling.  Raise (elevate) the injured area above the level of your heart while you are sitting or lying down. Driving  Do not drive until your health  care provider approves. If you have a brace or immobilizer on your leg, ask your health care provider when it is safe for you to drive.  Do not drive or use heavy machinery while taking prescription pain medicine. Activity  Rest as directed. Ask your health care provider what activities are safe for you.  Do physical therapy exercises as told by your health care provider. Physical therapy will help you regain strength and motion in your knee.  Follow instructions from your health care provider about: ? When you may start motion exercises. ? When you may start riding a stationary bike and doing other low-impact activities. ? When you may start to jog and do other high-impact activities. Safety  Do not use the injured limb to support your body weight until your health care provider says that you can. Use crutches as told by your health care provider. General instructions  Do not use any products that contain nicotine or tobacco, such as cigarettes and  e-cigarettes. These can delay bone healing. If you need help quitting, ask your health care provider.  To prevent or treat constipation while you are taking prescription pain medicine, your health care provider may recommend that you: ? Drink enough fluid to keep your urine clear or pale yellow. ? Take over-the-counter or prescription medicines. ? Eat foods that are high in fiber, such as fresh fruits and vegetables, whole grains, and beans. ? Limit foods that are high in fat and processed sugars, such as fried and sweet foods.  Take over-the-counter and prescription medicines only as told by your health care provider.  Keep all follow-up visits as told by your health care provider. This is important. Contact a health care provider if:  You have more redness, swelling, or pain around an incision.  You have more fluid or blood coming from an incision.  Your incision feels warm to the touch.  You have a fever.  You have pain or swelling in your knee, and it gets worse.  You have pain that does not get better with medicine. Get help right away if:  You have trouble breathing.  You have pus or a bad smell coming from an incision.  You have numbness and tingling near the knee joint. Summary  After the procedure, it is common to have knee pain with bruising and swelling on your knee, calf, and ankle.  Icing your knee and raising your leg above the level of your heart will help control the pain and the swelling.  Do physical therapy exercises as told by your health care provider. Physical therapy will help you regain strength and motion in your knee. This information is not intended to replace advice given to you by your health care provider. Make sure you discuss any questions you have with your health care provider. Document Released: 04/14/2013 Document Revised: 06/18/2016 Document Reviewed: 06/18/2016 Elsevier Interactive Patient Education  2017 [place]  Anesthesia, Adult General anesthesia is the use of medicines to make a person "go to sleep" (be unconscious) for a medical procedure. General anesthesia is often recommended when a procedure:  Is long.  Requires you to be still or in an unusual position.  Is major and can cause you to lose blood.  Is impossible to do without general anesthesia.  The medicines used for general anesthesia are called general anesthetics. In addition to making you sleep, the medicines:  Prevent pain.  Control your blood pressure.  Relax your muscles.  Tell a health  care provider about:  Any allergies you have.  All medicines you are taking, including vitamins, herbs, eye drops, creams, and over-the-counter medicines.  Any problems you or family members have had with anesthetic medicines.  Types of anesthetics you have had in the past.  Any bleeding disorders you have.  Any surgeries you have had.  Any medical conditions you have.  Any history of heart or lung conditions, such as heart failure, sleep apnea, or chronic obstructive pulmonary disease (COPD).  Whether you are pregnant or may be pregnant.  Whether you use tobacco, alcohol, marijuana, or street drugs.  Any history of Armed forces logistics/support/administrative officer.  Any history of depression or anxiety. What are the risks? Generally, this is a safe procedure. However, problems may occur, including:  Allergic reaction to anesthetics.  Lung and heart problems.  Inhaling food or liquids from your stomach into your lungs (aspiration).  Injury to nerves.  Waking up during your procedure and being unable to move (rare).  Extreme agitation or a state of mental confusion (delirium) when you wake up from the anesthetic.  Air in the bloodstream, which can lead to stroke.  These problems are more likely to develop if you are having a major surgery or if you have an advanced medical condition. You can prevent some of these complications by answering all of  your health care provider's questions thoroughly and by following all pre-procedure instructions. General anesthesia can cause side effects, including:  Nausea or vomiting  A sore throat from the breathing tube.  Feeling cold or shivery.  Feeling tired, washed out, or achy.  Sleepiness or drowsiness.  Confusion or agitation.  What happens before the procedure? Staying hydrated Follow instructions from your health care provider about hydration, which may include:  Up to 2 hours before the procedure - you may continue to drink clear liquids, such as water, clear fruit juice, black coffee, and plain tea.  Eating and drinking restrictions Follow instructions from your health care provider about eating and drinking, which may include:  8 hours before the procedure - stop eating heavy meals or foods such as meat, fried foods, or fatty foods.  6 hours before the procedure - stop eating light meals or foods, such as toast or cereal.  6 hours before the procedure - stop drinking milk or drinks that contain milk.  2 hours before the procedure - stop drinking clear liquids.  Medicines  Ask your health care provider about: ? Changing or stopping your regular medicines. This is especially important if you are taking diabetes medicines or blood thinners. ? Taking medicines such as aspirin and ibuprofen. These medicines can thin your blood. Do not take these medicines before your procedure if your health care provider instructs you not to. ? Taking new dietary supplements or medicines. Do not take these during the week before your procedure unless your health care provider approves them.  If you are told to take a medicine or to continue taking a medicine on the day of the procedure, take the medicine with sips of water. General instructions   Ask if you will be going home the same day, the following day, or after a longer hospital stay. ? Plan to have someone take you home. ? Plan to  have someone stay with you for the first 24 hours after you leave the hospital or clinic.  For 3-6 weeks before the procedure, try not to use any tobacco products, such as cigarettes, chewing tobacco, and e-cigarettes.  You may  brush your teeth on the morning of the procedure, but make sure to spit out the toothpaste. What happens during the procedure?  You will be given anesthetics through a mask and through an IV tube in one of your veins.  You may receive medicine to help you relax (sedative).  As soon as you are asleep, a breathing tube may be used to help you breathe.  An anesthesia specialist will stay with you throughout the procedure. He or she will help keep you comfortable and safe by continuing to give you medicines and adjusting the amount of medicine that you get. He or she will also watch your blood pressure, pulse, and oxygen levels to make sure that the anesthetics do not cause any problems.  If a breathing tube was used to help you breathe, it will be removed before you wake up. The procedure may vary among health care providers and hospitals. What happens after the procedure?  You will wake up, often slowly, after the procedure is complete, usually in a recovery area.  Your blood pressure, heart rate, breathing rate, and blood oxygen level will be monitored until the medicines you were given have worn off.  You may be given medicine to help you calm down if you feel anxious or agitated.  If you will be going home the same day, your health care provider may check to make sure you can stand, drink, and urinate.  Your health care providers will treat your pain and side effects before you go home.  Do not drive for 24 hours if you received a sedative.  You may: ? Feel nauseous and vomit. ? Have a sore throat. ? Have mental slowness. ? Feel cold or shivery. ? Feel sleepy. ? Feel tired. ? Feel sore or achy, even in parts of your body where you did not have  surgery. This information is not intended to replace advice given to you by your health care provider. Make sure you discuss any questions you have with your health care provider. Document Released: 10/01/2007 Document Revised: 12/05/2015 Document Reviewed: 06/08/2015 Elsevier Interactive Patient Education  2018 [place] Anesthesia, Adult, Care After These instructions provide you with information about caring for yourself after your procedure. Your health care provider may also give you more specific instructions. Your treatment has been planned according to current medical practices, but problems sometimes occur. Call your health care provider if you have any problems or questions after your procedure. What can I expect after the procedure? After the procedure, it is common to have:  Vomiting.  A sore throat.  Mental slowness.  It is common to feel:  Nauseous.  Cold or shivery.  Sleepy.  Tired.  Sore or achy, even in parts of your body where you did not have surgery.  Follow these instructions at home: For at least 24 hours after the procedure:  Do not: ? Participate in activities where you could fall or become injured. ? Drive. ? Use heavy machinery. ? Drink alcohol. ? Take sleeping pills or medicines that cause drowsiness. ? Make important decisions or sign legal documents. ? Take care of children on your own.  Rest. Eating and drinking  If you vomit, drink water, juice, or soup when you can drink without vomiting.  Drink enough fluid to keep your urine clear or pale yellow.  Make sure you have little or no nausea before eating solid foods.  Follow the diet recommended by your health care provider. General instructions  Have  a responsible adult stay with you until you are awake and alert.  Return to your normal activities as told by your health care provider. Ask your health care provider what activities are safe for you.  Take over-the-counter  and prescription medicines only as told by your health care provider.  If you smoke, do not smoke without supervision.  Keep all follow-up visits as told by your health care provider. This is important. Contact a health care provider if:  You continue to have nausea or vomiting at home, and medicines are not helpful.  You cannot drink fluids or start eating again.  You cannot urinate after 8-12 hours.  You develop a skin rash.  You have fever.  You have increasing redness at the site of your procedure. Get help right away if:  You have difficulty breathing.  You have chest pain.  You have unexpected bleeding.  You feel that you are having a life-threatening or urgent problem. This information is not intended to replace advice given to you by your health care provider. Make sure you discuss any questions you have with your health care provider. Document Released: 09/30/2000 Document Revised: 11/27/2015 Document Reviewed: 06/08/2015 Elsevier Interactive Patient Education  [employer].

## 2017-02-26 ENCOUNTER — Encounter (HOSPITAL_COMMUNITY): Payer: Self-pay

## 2017-02-26 ENCOUNTER — Encounter (HOSPITAL_COMMUNITY)
Admission: RE | Admit: 2017-02-26 | Discharge: 2017-02-26 | Disposition: A | Discharge status: Home / Self Care | Payer: 59 | Source: Ambulatory Visit | Attending: Orthopedic Surgery | Admitting: Orthopedic Surgery

## 2017-02-26 DIAGNOSIS — Z01812 Encounter for preprocedural laboratory examination: Secondary | ICD-10-CM | POA: Insufficient documentation

## 2017-02-26 DIAGNOSIS — M1711 Unilateral primary osteoarthritis, right knee: Secondary | ICD-10-CM | POA: Insufficient documentation

## 2017-02-26 DIAGNOSIS — M779 Enthesopathy, unspecified: Secondary | ICD-10-CM | POA: Insufficient documentation

## 2017-02-26 DIAGNOSIS — Z86718 Personal history of other venous thrombosis and embolism: Secondary | ICD-10-CM | POA: Insufficient documentation

## 2017-02-26 LAB — CBC WITH DIFFERENTIAL/PLATELET
BASOS ABS: 0 10*3/uL (ref 0.0–0.1)
BASOS PCT: 0 %
EOS ABS: 0.1 10*3/uL (ref 0.0–0.7)
EOS PCT: 1 %
HCT: 40.6 % (ref 36.0–46.0)
Hemoglobin: 14.3 g/dL (ref 12.0–15.0)
Lymphocytes Relative: 29 %
Lymphs Abs: 2 10*3/uL (ref 0.7–4.0)
MCH: 33.7 pg (ref 26.0–34.0)
MCHC: 35.2 g/dL (ref 30.0–36.0)
MCV: 95.8 fL (ref 78.0–100.0)
MONO ABS: 0.4 10*3/uL (ref 0.1–1.0)
Monocytes Relative: 6 %
NEUTROS ABS: 4.2 10*3/uL (ref 1.7–7.7)
NEUTROS PCT: 64 %
PLATELETS: 182 10*3/uL (ref 150–400)
RBC: 4.24 MIL/uL (ref 3.87–5.11)
RDW: 12.2 % (ref 11.5–15.5)
WBC: 6.6 10*3/uL (ref 4.0–10.5)

## 2017-02-26 LAB — SURGICAL PCR SCREEN
MRSA, PCR: NEGATIVE
STAPHYLOCOCCUS AUREUS: NEGATIVE

## 2017-02-26 LAB — BASIC METABOLIC PANEL
ANION GAP: 5 (ref 5–15)
BUN: 6 mg/dL (ref 6–20)
CALCIUM: 8.6 mg/dL — AB (ref 8.9–10.3)
CO2: 27 mmol/L (ref 22–32)
Chloride: 105 mmol/L (ref 101–111)
Creatinine, Ser: 0.79 mg/dL (ref 0.44–1.00)
Glucose, Bld: 88 mg/dL (ref 65–99)
Potassium: 3.9 mmol/L (ref 3.5–5.1)
Sodium: 137 mmol/L (ref 135–145)

## 2017-02-26 LAB — HCG, SERUM, QUALITATIVE: PREG SERUM: NEGATIVE

## 2017-03-04 DIAGNOSIS — F17200 Nicotine dependence, unspecified, uncomplicated: Secondary | ICD-10-CM | POA: Diagnosis not present

## 2017-03-04 DIAGNOSIS — F4322 Adjustment disorder with anxiety: Secondary | ICD-10-CM | POA: Diagnosis not present

## 2017-03-04 DIAGNOSIS — R509 Fever, unspecified: Secondary | ICD-10-CM | POA: Diagnosis not present

## 2017-03-04 NOTE — H&P (Signed)
Preop appointment         Chief Complaint  Patient presents with  . Follow-up      Referral fro Dr. Luna Glasgow for right knee to review for surgery.      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 She did have an MRI she has a torn medial and lateral meniscus and patellofemoral and medial compartment arthritis   One of her other complaints is that the knee is making noise     Review of Systems  Constitutional: Negative for fever.  Skin: Negative for rash.  Neurological: Negative for tingling.  Endo/Heme/Allergies: Does not bruise/bleed easily.  All other systems reviewed and are negative.           Past Medical History:  Diagnosis Date  . Arthritis    . DVT (deep venous thrombosis) ([place])    . PE (pulmonary embolism)    . Seizures ([place])      as child from severe ear infections- no meds now and no seizures since age 16           Past Surgical History:  Procedure Laterality Date  . CESAREAN SECTION      . CHOLECYSTECTOMY N/A 12/21/2012    Procedure: LAPAROSCOPIC CHOLECYSTECTOMY;  Surgeon: Donato Heinz, MD;  Location: [facility];  Service: General;  Laterality: N/A;           Family History  Problem Relation Age of Onset  . Cancer Maternal Grandmother    . Cancer Maternal Grandfather    . Diabetes Paternal Grandmother    . Heart disease Paternal Grandfather            Social History  Substance Use Topics  . Smoking status: Current Every Day Smoker      Packs/day: 0.50      Years: 26.00      Types: Cigarettes  . Smokeless tobacco: Never Used  . Alcohol use No        Comment: reports occasional beer but very seldom      Ht 5' 9"  (1.753 m)   Wt 215 lb (97.5 kg)   BMI 31.75 kg/m    Physical Exam   Constitutional: She is oriented to person, place, and time. She appears well-developed and well-nourished.  Musculoskeletal:       Right knee: Medial joint line and lateral joint line tenderness noted.  Neurological: She is alert and oriented to person, place, and time. Gait abnormal.  Psychiatric: She has a normal mood and affect.  Vitals reviewed.     Left Knee Exam    Tenderness  None   Range of Motion  Normal left knee ROM   Muscle Strength  Normal left knee strength   Tests  Drawer:       Anterior - Negative        Comments:  Normal neurovascular exam   Right Knee Exam    Tenderness  The patient is experiencing tenderness in the medial joint line, lateral joint line.     Plain films I reviewed they don't show any major change in the knee   Her MRI report was read as   IMPRESSION: 1. Inferior articular surfacing tears of the posterior horn of the medial meniscus  and anterior horn of the lateral meniscus. 2. Intact cruciate and collateral ligaments. 3. Mild thinning of the patellar and medial femorotibial cartilage.     Electronically Signed   By: Ashley Royalty M.D.   On: 01/21/2017 19:27   I do see the chondral thinning I see the anterior horn lateral meniscal abnormality which is consistent with tear in the posterior horn medial meniscus which is more consistent with tear         Encounter Diagnoses  Name Primary?  . Derangement of posterior horn of medial meniscus of right knee Yes  . Meniscus, lateral, derangement, right    . Primary osteoarthritis of right knee          PLAN:    Arthroscopy right knee medial and lateral meniscectomy   This procedure has been fully reviewed with the patient and written informed consent has been obtained. She understands that the knee will probably still make noise and still give her some pain but her knee should improve with surgery

## 2017-03-05 ENCOUNTER — Ambulatory Visit (HOSPITAL_COMMUNITY): Payer: 59 | Admitting: Anesthesiology

## 2017-03-05 ENCOUNTER — Ambulatory Visit (HOSPITAL_COMMUNITY)
Admission: RE | Admit: 2017-03-05 | Discharge: 2017-03-05 | Disposition: A | Discharge status: Home / Self Care | Payer: 59 | Source: Ambulatory Visit | Attending: Orthopedic Surgery | Admitting: Orthopedic Surgery

## 2017-03-05 ENCOUNTER — Encounter (HOSPITAL_COMMUNITY): Payer: Self-pay | Admitting: *Deleted

## 2017-03-05 ENCOUNTER — Encounter (HOSPITAL_COMMUNITY)
Admission: RE | Disposition: A | Discharge status: Home / Self Care | Payer: Self-pay | Source: Ambulatory Visit | Attending: Orthopedic Surgery

## 2017-03-05 DIAGNOSIS — Z833 Family history of diabetes mellitus: Secondary | ICD-10-CM | POA: Insufficient documentation

## 2017-03-05 DIAGNOSIS — M23041 Cystic meniscus, anterior horn of lateral meniscus, right knee: Secondary | ICD-10-CM | POA: Diagnosis not present

## 2017-03-05 DIAGNOSIS — F17210 Nicotine dependence, cigarettes, uncomplicated: Secondary | ICD-10-CM | POA: Diagnosis not present

## 2017-03-05 DIAGNOSIS — M1711 Unilateral primary osteoarthritis, right knee: Secondary | ICD-10-CM | POA: Diagnosis not present

## 2017-03-05 DIAGNOSIS — Z8249 Family history of ischemic heart disease and other diseases of the circulatory system: Secondary | ICD-10-CM | POA: Diagnosis not present

## 2017-03-05 DIAGNOSIS — M23000 Cystic meniscus, unspecified lateral meniscus, right knee: Secondary | ICD-10-CM | POA: Diagnosis not present

## 2017-03-05 DIAGNOSIS — S83511A Sprain of anterior cruciate ligament of right knee, initial encounter: Secondary | ICD-10-CM

## 2017-03-05 DIAGNOSIS — M94261 Chondromalacia, right knee: Secondary | ICD-10-CM | POA: Diagnosis not present

## 2017-03-05 DIAGNOSIS — Z86711 Personal history of pulmonary embolism: Secondary | ICD-10-CM | POA: Insufficient documentation

## 2017-03-05 DIAGNOSIS — M2351 Chronic instability of knee, right knee: Secondary | ICD-10-CM | POA: Diagnosis not present

## 2017-03-05 DIAGNOSIS — Z9889 Other specified postprocedural states: Secondary | ICD-10-CM | POA: Insufficient documentation

## 2017-03-05 DIAGNOSIS — S83511D Sprain of anterior cruciate ligament of right knee, subsequent encounter: Secondary | ICD-10-CM | POA: Diagnosis not present

## 2017-03-05 DIAGNOSIS — M2241 Chondromalacia patellae, right knee: Secondary | ICD-10-CM | POA: Insufficient documentation

## 2017-03-05 DIAGNOSIS — Z9049 Acquired absence of other specified parts of digestive tract: Secondary | ICD-10-CM | POA: Diagnosis not present

## 2017-03-05 DIAGNOSIS — M23241 Derangement of anterior horn of lateral meniscus due to old tear or injury, right knee: Secondary | ICD-10-CM | POA: Diagnosis present

## 2017-03-05 HISTORY — PX: KNEE ARTHROSCOPY WITH MEDIAL MENISECTOMY: SHX5651

## 2017-03-05 SURGERY — ARTHROSCOPY, KNEE, WITH MEDIAL MENISCECTOMY
Anesthesia: General | Site: Knee | Laterality: Right

## 2017-03-05 MED ORDER — LIDOCAINE HCL 1 % IJ SOLN
INTRAMUSCULAR | Status: DC | PRN
  Administered 2017-03-05: 25 mg via INTRADERMAL

## 2017-03-05 MED ORDER — KETOROLAC TROMETHAMINE 30 MG/ML IJ SOLN
INTRAMUSCULAR | Status: AC
  Filled 2017-03-05: qty 1

## 2017-03-05 MED ORDER — IBUPROFEN 800 MG PO TABS: 800.0000 mg | ORAL_TABLET | Freq: Three times a day (TID) | ORAL | 1 refills | Status: DC | PRN

## 2017-03-05 MED ORDER — ONDANSETRON HCL 4 MG/2ML IJ SOLN
INTRAMUSCULAR | Status: AC
  Filled 2017-03-05: qty 2

## 2017-03-05 MED ORDER — DEXTROSE 5 % IV SOLN
INTRAVENOUS | Status: DC | PRN
  Administered 2017-03-05: 07:00:00 via INTRAVENOUS

## 2017-03-05 MED ORDER — SODIUM CHLORIDE 0.9 % IR SOLN
Status: DC | PRN
  Administered 2017-03-05 (×3): 3000 mL

## 2017-03-05 MED ORDER — FENTANYL CITRATE (PF) 100 MCG/2ML IJ SOLN
INTRAMUSCULAR | Status: DC | PRN
  Administered 2017-03-05 (×4): 50 ug via INTRAVENOUS

## 2017-03-05 MED ORDER — BUPIVACAINE-EPINEPHRINE (PF) 0.5% -1:200000 IJ SOLN
INTRAMUSCULAR | Status: DC | PRN
  Administered 2017-03-05: 60 mL via PERINEURAL

## 2017-03-05 MED ORDER — MIDAZOLAM HCL 2 MG/2ML IJ SOLN
INTRAMUSCULAR | Status: AC
  Filled 2017-03-05: qty 2

## 2017-03-05 MED ORDER — PROPOFOL 10 MG/ML IV BOLUS
INTRAVENOUS | Status: DC | PRN
  Administered 2017-03-05: 150 mg via INTRAVENOUS

## 2017-03-05 MED ORDER — LACTATED RINGERS IV SOLN
INTRAVENOUS | Status: DC
  Administered 2017-03-05: 1000 mL via INTRAVENOUS

## 2017-03-05 MED ORDER — FENTANYL CITRATE (PF) 100 MCG/2ML IJ SOLN
INTRAMUSCULAR | Status: AC
  Filled 2017-03-05: qty 4

## 2017-03-05 MED ORDER — MIDAZOLAM HCL 5 MG/5ML IJ SOLN
INTRAMUSCULAR | Status: DC | PRN
Start: 2017-03-05 — End: 2017-03-05
  Administered 2017-03-05: 2 mg via INTRAVENOUS

## 2017-03-05 MED ORDER — IBUPROFEN 800 MG PO TABS
800.0000 mg | ORAL_TABLET | Freq: Once | ORAL | Status: AC
  Administered 2017-03-05: 800 mg via ORAL

## 2017-03-05 MED ORDER — PROPOFOL 10 MG/ML IV BOLUS
INTRAVENOUS | Status: AC
  Filled 2017-03-05: qty 40

## 2017-03-05 MED ORDER — ONDANSETRON 4 MG PO TBDP
4.0000 mg | ORAL_TABLET | Freq: Once | ORAL | Status: AC
  Administered 2017-03-05: 4 mg via ORAL

## 2017-03-05 MED ORDER — EPINEPHRINE PF 1 MG/ML IJ SOLN
INTRAMUSCULAR | Status: AC
  Filled 2017-03-05: qty 4

## 2017-03-05 MED ORDER — IBUPROFEN 800 MG PO TABS
ORAL_TABLET | ORAL | Status: AC
  Filled 2017-03-05: qty 1

## 2017-03-05 MED ORDER — OXYCODONE HCL 5 MG/5ML PO SOLN: 5.0000 mg | Freq: Once | ORAL | Status: AC | PRN

## 2017-03-05 MED ORDER — LIDOCAINE HCL (PF) 1 % IJ SOLN
INTRAMUSCULAR | Status: AC
  Filled 2017-03-05: qty 5

## 2017-03-05 MED ORDER — VANCOMYCIN HCL IN DEXTROSE 1-5 GM/200ML-% IV SOLN
1000.0000 mg | INTRAVENOUS | Status: AC
  Administered 2017-03-05: 1000 mg via INTRAVENOUS
  Filled 2017-03-05: qty 200

## 2017-03-05 MED ORDER — HYDROCODONE-ACETAMINOPHEN 5-325 MG PO TABS: 1.0000 | ORAL_TABLET | ORAL | 0 refills | Status: DC | PRN

## 2017-03-05 MED ORDER — SODIUM CHLORIDE 0.9 % IR SOLN
Status: DC | PRN
  Administered 2017-03-05: 1000 mL

## 2017-03-05 MED ORDER — ONDANSETRON HCL 4 MG/2ML IJ SOLN
4.0000 mg | Freq: Once | INTRAMUSCULAR | Status: AC
  Administered 2017-03-05: 4 mg via INTRAVENOUS

## 2017-03-05 MED ORDER — ACETAMINOPHEN 325 MG PO TABS
ORAL_TABLET | ORAL | Status: AC
  Filled 2017-03-05: qty 2

## 2017-03-05 MED ORDER — CHLORHEXIDINE GLUCONATE 4 % EX LIQD: 60.0000 mL | Freq: Once | CUTANEOUS | Status: DC

## 2017-03-05 MED ORDER — ACETAMINOPHEN 325 MG PO TABS
650.0000 mg | ORAL_TABLET | Freq: Four times a day (QID) | ORAL | Status: DC | PRN
  Administered 2017-03-05: 650 mg via ORAL

## 2017-03-05 MED ORDER — MIDAZOLAM HCL 2 MG/2ML IJ SOLN
1.0000 mg | Freq: Once | INTRAMUSCULAR | Status: AC | PRN
  Administered 2017-03-05: 2 mg via INTRAVENOUS

## 2017-03-05 MED ORDER — OXYCODONE HCL 5 MG PO TABS
5.0000 mg | ORAL_TABLET | Freq: Once | ORAL | Status: AC | PRN
  Administered 2017-03-05: 5 mg via ORAL
  Filled 2017-03-05: qty 1

## 2017-03-05 MED ORDER — ONDANSETRON 4 MG PO TBDP
ORAL_TABLET | ORAL | Status: AC
  Filled 2017-03-05: qty 1

## 2017-03-05 MED ORDER — BUPIVACAINE-EPINEPHRINE (PF) 0.5% -1:200000 IJ SOLN
INTRAMUSCULAR | Status: AC
  Filled 2017-03-05: qty 60

## 2017-03-05 MED ORDER — HYDROMORPHONE HCL 1 MG/ML IJ SOLN: 0.2500 mg | INTRAMUSCULAR | Status: DC | PRN

## 2017-03-05 MED ORDER — KETOROLAC TROMETHAMINE 30 MG/ML IJ SOLN
30.0000 mg | Freq: Once | INTRAMUSCULAR | Status: AC
  Administered 2017-03-05: 30 mg via INTRAVENOUS

## 2017-03-05 SURGICAL SUPPLY — 48 items
BAG HAMPER (MISCELLANEOUS) ×2 IMPLANT
BANDAGE ELASTIC 6 LF NS (GAUZE/BANDAGES/DRESSINGS) ×2 IMPLANT
BANDAGE ELASTIC 6 VELCRO ST LF (GAUZE/BANDAGES/DRESSINGS) ×2 IMPLANT
BLADE AGGRESSIVE PLUS 4.0 (BLADE) ×2 IMPLANT
BLADE SURG SZ11 CARB STEEL (BLADE) ×2 IMPLANT
CHLORAPREP W/TINT 26ML (MISCELLANEOUS) ×2 IMPLANT
CLOTH BEACON ORANGE TIMEOUT ST (SAFETY) ×2 IMPLANT
COOLER CRYO IC GRAV AND TUBE (ORTHOPEDIC SUPPLIES) ×2 IMPLANT
CUFF CRYO KNEE LG 20X31 COOLER (ORTHOPEDIC SUPPLIES) ×2 IMPLANT
CUFF TOURNIQUET SINGLE 34IN LL (TOURNIQUET CUFF) ×2 IMPLANT
DECANTER SPIKE VIAL GLASS SM (MISCELLANEOUS) ×4 IMPLANT
GAUZE SPONGE 4X4 12PLY STRL (GAUZE/BANDAGES/DRESSINGS) ×2 IMPLANT
GAUZE SPONGE 4X4 16PLY XRAY LF (GAUZE/BANDAGES/DRESSINGS) ×2 IMPLANT
GAUZE XEROFORM 5X9 LF (GAUZE/BANDAGES/DRESSINGS) ×2 IMPLANT
GLOVE BIOGEL PI IND STRL 7.0 (GLOVE) ×2 IMPLANT
GLOVE BIOGEL PI INDICATOR 7.0 (GLOVE) ×2
GLOVE SKINSENSE NS SZ8.0 LF (GLOVE) ×1
GLOVE SKINSENSE STRL SZ8.0 LF (GLOVE) ×1 IMPLANT
GLOVE SS N UNI LF 8.5 STRL (GLOVE) ×2 IMPLANT
GOWN STRL REUS W/ TWL LRG LVL3 (GOWN DISPOSABLE) ×1 IMPLANT
GOWN STRL REUS W/TWL LRG LVL3 (GOWN DISPOSABLE) ×1
GOWN STRL REUS W/TWL XL LVL3 (GOWN DISPOSABLE) ×2 IMPLANT
HLDR LEG FOAM (MISCELLANEOUS) ×1 IMPLANT
IV NS IRRIG 3000ML ARTHROMATIC (IV SOLUTION) ×6 IMPLANT
KIT BLADEGUARD II DBL (SET/KITS/TRAYS/PACK) ×2 IMPLANT
KIT ROOM TURNOVER AP CYSTO (KITS) ×2 IMPLANT
LEG HOLDER FOAM (MISCELLANEOUS) ×1
MANIFOLD NEPTUNE II (INSTRUMENTS) ×2 IMPLANT
MARKER SKIN DUAL TIP RULER LAB (MISCELLANEOUS) ×2 IMPLANT
NEEDLE HYPO 18GX1.5 BLUNT FILL (NEEDLE) ×2 IMPLANT
NEEDLE HYPO 21X1.5 SAFETY (NEEDLE) ×2 IMPLANT
NEEDLE SPNL 18GX3.5 QUINCKE PK (NEEDLE) ×2 IMPLANT
NS IRRIG 1000ML POUR BTL (IV SOLUTION) ×2 IMPLANT
PACK ARTHRO LIMB DRAPE STRL (MISCELLANEOUS) ×2 IMPLANT
PAD ABD 5X9 TENDERSORB (GAUZE/BANDAGES/DRESSINGS) ×2 IMPLANT
PAD ABD 7.5X8 STRL (GAUZE/BANDAGES/DRESSINGS) ×2 IMPLANT
PAD ARMBOARD 7.5X6 YLW CONV (MISCELLANEOUS) ×2 IMPLANT
PADDING CAST COTTON 6X4 STRL (CAST SUPPLIES) ×2 IMPLANT
PADDING WEBRIL 6 STERILE (GAUZE/BANDAGES/DRESSINGS) ×2 IMPLANT
PROBE BIPOLAR 50 DEGREE SUCT (MISCELLANEOUS) ×2 IMPLANT
PROBE BIPOLAR ATHRO 135MM 90D (MISCELLANEOUS) ×2 IMPLANT
SET ARTHROSCOPY INST (INSTRUMENTS) ×2 IMPLANT
SET ARTHROSCOPY PUMP TUBE (IRRIGATION / IRRIGATOR) ×2 IMPLANT
SET BASIN LINEN APH (SET/KITS/TRAYS/PACK) ×2 IMPLANT
SUT ETHILON 3 0 FSL (SUTURE) ×2 IMPLANT
SYR 30ML LL (SYRINGE) ×2 IMPLANT
SYRINGE 10CC LL (SYRINGE) ×2 IMPLANT
TUBE CONNECTING 12X1/4 (SUCTIONS) ×6 IMPLANT

## 2017-03-05 NOTE — Transfer of Care (Signed)
Immediate Anesthesia Transfer of Care Note  Patient: AMAYIAH GOSNELL  Procedure(s) Performed: Procedure(s): chondroplasty patella, medial femoral condyle (Right)  Patient Location: PACU  Anesthesia Type:General  Level of Consciousness: awake and patient cooperative  Airway & Oxygen Therapy: Patient Spontanous Breathing and Patient connected to face mask oxygen  Post-op Assessment: Report given to RN, Post -op Vital signs reviewed and stable and Patient moving all extremities  Post vital signs: Reviewed and stable  Last Vitals:  Vitals:   03/05/17 0700 03/05/17 0715  BP: 100/66 114/67  Resp: (!) 21 (!) 26  Temp:    SpO2: 96% 95%    Last Pain:  Vitals:   03/05/17 0647  TempSrc: Oral      Patients Stated Pain Goal: 8 (63/84/53 [id number])  Complications: No apparent anesthesia complications

## 2017-03-05 NOTE — Brief Op Note (Signed)
03/05/2017  8:35 AM  PATIENT:  Jaime Massey  41 y.o. female  PRE-OPERATIVE DIAGNOSIS:  RIGHT MEDIAL AND LATERAL MENISCUS TEARS  POST-OPERATIVE DIAGNOSIS:  Chondromalacia patella, medial femoral condyle, lateral femoral condyle, chronic anterior cruciate ligament tear, lateral meniscus anterior horn meniscal cyst  PROCEDURE:  Procedure(s): 29877 [id number]   KNEE ARTHROSCOPY WITH chondroplasty of the patella and medial femoral condyle, removal of meniscal cyst lateral meniscus, exam under anesthesia  Operative findings  grade 2 lesion of the median ridge of the patella,  grade 2 chondral lesion medial femoral condyle,  grade 2 lesion lateral femoral condyle,  old anterior cruciate ligament tear with proximal femoral attachment scar to the PCL,  anterior horn lateral meniscus cyst. Exam under anesthesia revealed a stable Lachman and stable pivot shift test.  SURGEON:  Surgeon(s) and Role:    Carole Civil, MD - Primary  PHYSICIAN ASSISTANT:   ASSISTANTS: none   ANESTHESIA:   general  EBL:  Total I/O In: 500 [I.V.:500] Out: 5 [Blood:5]  BLOOD ADMINISTERED:none  DRAINS: none   LOCAL MEDICATIONS USED:  MARCAINE   , Amount: 60 ml and OTHER epi  SPECIMEN:  No Specimen  DISPOSITION OF SPECIMEN:  N/A  COUNTS:  YES  TOURNIQUET:    DICTATION: .Dragon Dictation  PLAN OF CARE: Discharge to home after PACU  PATIENT DISPOSITION:  PACU - hemodynamically stable.   Delay start of Pharmacological VTE agent (>24hrs) due to surgical blood loss or risk of bleeding: yes  29877 [phone]

## 2017-03-05 NOTE — Anesthesia Postprocedure Evaluation (Signed)
Anesthesia Post Note  Patient: Jaime Massey  Procedure(s) Performed: Procedure(s) (LRB): chondroplasty patella, medial femoral condyle (Right)  Patient location during evaluation: PACU Anesthesia Type: General Level of consciousness: awake and alert, oriented and patient cooperative Pain management: pain level controlled Vital Signs Assessment: post-procedure vital signs reviewed and stable Respiratory status: spontaneous breathing, nonlabored ventilation and respiratory function stable Cardiovascular status: blood pressure returned to baseline Postop Assessment: no signs of nausea or vomiting Anesthetic complications: no     Last Vitals:  Vitals:   03/05/17 0715 03/05/17 0845  BP: 114/67 114/73  Pulse:  88  Resp: (!) 26 20  Temp:  36.7 C  SpO2: 95% 100%    Last Pain:  Vitals:   03/05/17 0647  TempSrc: Oral                 Tenna Lacko J

## 2017-03-05 NOTE — Anesthesia Procedure Notes (Signed)
Procedure Name: LMA Insertion Date/Time: 03/05/2017 7:37 AM Performed by: Charmaine Downs Pre-anesthesia Checklist: Patient identified, Patient being monitored, Emergency Drugs available, Timeout performed and Suction available Patient Re-evaluated:Patient Re-evaluated prior to induction Oxygen Delivery Method: Circle System Utilized Preoxygenation: Pre-oxygenation with 100% oxygen Induction Type: IV induction Ventilation: Mask ventilation without difficulty LMA: LMA inserted LMA Size: 4.0 Number of attempts: 1 Placement Confirmation: positive ETCO2 and breath sounds checked- equal and bilateral Tube secured with: Tape Dental Injury: Teeth and Oropharynx as per pre-operative assessment

## 2017-03-05 NOTE — Op Note (Signed)
03/05/2017  8:35 AM  PATIENT:  Jaime Massey  41 y.o. female  PRE-OPERATIVE DIAGNOSIS:  RIGHT MEDIAL AND LATERAL MENISCUS TEARS  POST-OPERATIVE DIAGNOSIS:  Chondromalacia patella, medial femoral condyle, lateral femoral condyle, chronic anterior cruciate ligament tear, lateral meniscus anterior horn meniscal cyst  PROCEDURE:  Procedure(s): 29877 [phone]   KNEE ARTHROSCOPY WITH chondroplasty of the patella and medial femoral condyle, removal of meniscal cyst lateral meniscus, exam under anesthesia  Operative findings  grade 2 lesion of the median ridge of the patella,  grade 2 chondral lesion medial femoral condyle,  grade 2 lesion lateral femoral condyle,  old anterior cruciate ligament tear with proximal femoral attachment scar to the PCL,  anterior horn lateral meniscus cyst. Exam under anesthesia revealed a stable Lachman and stable pivot shift test.  SURGEON:  Surgeon(s) and Role:    Carole Civil, MD - Primary  PHYSICIAN ASSISTANT:   ASSISTANTS: none   ANESTHESIA:   general  EBL:  Total I/O In: 500 [I.V.:500] Out: 5 [Blood:5]  BLOOD ADMINISTERED:none  DRAINS: none   LOCAL MEDICATIONS USED:  MARCAINE   , Amount: 60 ml and OTHER epi  SPECIMEN:  No Specimen  DISPOSITION OF SPECIMEN:  N/A  COUNTS:  YES  TOURNIQUET:    DICTATION: .Dragon Dictation  Surgery was done as follows. Patient was identified in evaluated in the preoperative area and was cleared for surgery. A thorough chart review and update was completed. The right leg was checked for any skin lesions and it was clean. Vital signs are stable  Patient was taken to surgery for general anesthesia. She was placed in supine position  Leg holder was placed on the right leg a padded leg holder was placed on the left leg  She had sterile prep and drape with ChloraPrep after which the timeout was completed  Standard lateral portal was established and the scope was placed into the joint and  diagnostic arthroscopy was performed  The main findings are noted above.  A medial portal was established and then a repeat diagnostic arthroscopy was performed using the probe to palpate all intra-articular structures  There was no medial meniscal tear. There was a chondral lesion of the medial femoral condyle which was debrided with the shaver  The notch area showed an intact PCL with an old anterior cruciate ligament tear from the femoral attachment scar to the PCL with an absent lateral wall sign. At the anterior horn lateral meniscus was a cystic structure which was evaluated and debrided.  The lateral meniscus was normal. There was a chondral lesion of the lateral femoral condyle which was left intact.  The patellofemoral joint had a large area of chondromalacia at the median ridge which was removed with a shaver and a 90 and 50 wand  The joint was irrigated and injected with 60 mL of Marcaine with epinephrine. 2 Band-Aids were applied and then a separate exam under anesthesia was performed due to the finding of anterior cruciate ligament tear  The pivot shift test was negative the Lachman test was normal  The Band-Aids were removed and sterile dressings Ace bandage and Cryo/Cuff were applied. Cryo/Cuff was activated  Patient was extubated taken to recovery room in stable condition  PLAN OF CARE: Discharge to home after PACU  PATIENT DISPOSITION:  PACU - hemodynamically stable.   Delay start of Pharmacological VTE agent (>24hrs) due to surgical blood loss or risk of bleeding: yes  29877 [phone]

## 2017-03-05 NOTE — Anesthesia Preprocedure Evaluation (Addendum)
Anesthesia Evaluation  Patient identified by MRN, date of birth, ID band Patient awake    Airway Mallampati: I  TM Distance: >3 FB Neck ROM: Full    Dental  (+) Teeth Intact   Pulmonary Current Smoker,    Pulmonary exam normal        Cardiovascular Exercise Tolerance: Good negative cardio ROS Normal cardiovascular exam Rhythm:Regular Rate:Normal     Neuro/Psych Seizures - (only as a child (age 41 - 35 ) no problems since),     GI/Hepatic negative GI ROS, Neg liver ROS,   Endo/Other    Renal/GU negative Renal ROS     Musculoskeletal  (+) Arthritis , Osteoarthritis,    Abdominal Normal abdominal exam  (+)   Peds  Hematology negative hematology ROS (+)   Anesthesia Other Findings Preg, Serum  hCG, serum, qualitative  Collected: 02/26/17 0918 Resulting lab: [facility] Reference range: NEGATIVE Value: NEGATIVE    Reproductive/Obstetrics                            Anesthesia Physical Anesthesia Plan  ASA: II  Anesthesia Plan: General   Post-op Pain Management:    Induction: Intravenous  PONV Risk Score and Plan:   Airway Management Planned: LMA  Additional Equipment:   Intra-op Plan:   Post-operative Plan: Extubation in OR  Informed Consent: I have reviewed the patients History and Physical, chart, labs and discussed the procedure including the risks, benefits and alternatives for the proposed anesthesia with the patient or authorized representative who has indicated his/her understanding and acceptance.     Plan Discussed with: CRNA  Anesthesia Plan Comments:         Anesthesia Quick Evaluation

## 2017-03-05 NOTE — Interval H&P Note (Signed)
History and Physical Interval Note:  03/05/2017 7:20 AM  Jaime Massey  has presented today for surgery, with the diagnosis of RIGHT MEDIAL AND LATERAL MENISCUS TEARS  The various methods of treatment have been discussed with the patient and family. After consideration of risks, benefits and other options for treatment, the patient has consented to  Procedure(s): KNEE ARTHROSCOPY WITH MEDIAL AND LATERAL MENISECTOMY (Right) as a surgical intervention .  The patient's history has been reviewed, patient examined, no change in status, stable for surgery.  I have reviewed the patient's chart and labs.  Questions were answered to the patient's satisfaction.     Arther Abbott

## 2017-03-06 ENCOUNTER — Telehealth: Payer: Self-pay | Admitting: Orthopedic Surgery

## 2017-03-06 NOTE — Telephone Encounter (Signed)
Jaime Massey called and stated that she is taking the Hydrocodone and the Ibuprofen and is still having pain.  She also states that she is icing the knee, elevating the knee and using crutches.  She said none of this works.    She wants to know about any other medication that can be given to her.  Please advise

## 2017-03-06 NOTE — Telephone Encounter (Signed)
Routing to Dr. Aline Brochure to advise

## 2017-03-07 ENCOUNTER — Telehealth: Payer: Self-pay | Admitting: Orthopedic Surgery

## 2017-03-07 ENCOUNTER — Ambulatory Visit: Payer: Self-pay | Admitting: Orthopedic Surgery

## 2017-03-07 NOTE — Telephone Encounter (Signed)
Patient called back and I advised her of what Dr. Aline Brochure said.  I have also advised our nurse Tammy that I have spoken with this patient.

## 2017-03-07 NOTE — Telephone Encounter (Signed)
No  Take 2 tablets of hydrocodone  It will ease up today

## 2017-03-07 NOTE — Telephone Encounter (Signed)
Script has to last 5 days state law !

## 2017-03-07 NOTE — Telephone Encounter (Signed)
noted 

## 2017-03-07 NOTE — Telephone Encounter (Signed)
Patient states she will be out of pain meds by this weekend and requests refill on Hydrocodone/Acetaminophen 5-325  Mgs.  Sig: Take 1-2 tablets by mouth every 4 (four) hours as needed for moderate pain.

## 2017-03-07 NOTE — Telephone Encounter (Signed)
Routing to Dr Harrison 

## 2017-03-12 ENCOUNTER — Ambulatory Visit: Payer: 59 | Admitting: Orthopedic Surgery

## 2017-03-12 DIAGNOSIS — Z4889 Encounter for other specified surgical aftercare: Secondary | ICD-10-CM

## 2017-03-12 NOTE — Progress Notes (Signed)
Patient came in for suture removal from right chondroplasty on 03/05/17. Incision was clean, dry, intact. No drainage or redness noted. Sutures were removed and band aids applied. Patient stated she was still having a lot of pain. She stated she is taking 3(three) tylenol 500 mg every 4 hours and 1(one) ibuprofen every 8 hours and asked if she could get a refill on hydrocodone. I asked Dr. Luna Glasgow and he declined and advised she continue with what she was taking.Patient advised to keep appointment with Dr. Aline Brochure on 03/19/17 at 11:00am. Continue exercises, icing and elevating.

## 2017-03-19 ENCOUNTER — Ambulatory Visit (INDEPENDENT_AMBULATORY_CARE_PROVIDER_SITE_OTHER): Payer: 59 | Admitting: Orthopedic Surgery

## 2017-03-19 DIAGNOSIS — Z4889 Encounter for other specified surgical aftercare: Secondary | ICD-10-CM

## 2017-03-19 DIAGNOSIS — Z9889 Other specified postprocedural states: Secondary | ICD-10-CM

## 2017-03-19 NOTE — Progress Notes (Signed)
Postop visit status post arthroscopy of the right knee  Date of surgery was August 29  PRE-OPERATIVE DIAGNOSIS:  RIGHT MEDIAL AND LATERAL MENISCUS TEARS   POST-OPERATIVE DIAGNOSIS:  Chondromalacia patella, medial femoral condyle, lateral femoral condyle, chronic anterior cruciate ligament tear, lateral meniscus anterior horn meniscal cyst   PROCEDURE:  Procedure(s): 29877 [phone]     KNEE ARTHROSCOPY WITH chondroplasty of the patella and medial femoral condyle, removal of meniscal cyst lateral meniscus, exam under anesthesia   Operative findings  grade 2 lesion of the median ridge of the patella,  grade 2 chondral lesion medial femoral condyle,  grade 2 lesion lateral femoral condyle,  old anterior cruciate ligament tear with proximal femoral attachment scar to the PCL,  anterior horn lateral meniscus cyst. Exam under anesthesia revealed a stable Lachman and stable pivot shift test. Currently her main complaint is that she cannot fully straighten the knee or fully bend it and she did have a lot of postoperative swelling  Her knee otherwise looks good  Recommend physical therapy although she will only get 3 visits for Medicaid, and follow-up in 4 weeks

## 2017-03-19 NOTE — Addendum Note (Signed)
Addended by: Baldomero Lamy B on: 03/19/2017 11:26 AM   Modules accepted: Orders

## 2017-03-19 NOTE — Patient Instructions (Signed)
Continue ice and home exercises  Start physical therapy return in 4 weeks

## 2017-03-26 ENCOUNTER — Ambulatory Visit (HOSPITAL_COMMUNITY): Payer: Medicaid Other | Attending: Orthopedic Surgery | Admitting: Physical Therapy

## 2017-03-26 DIAGNOSIS — M25561 Pain in right knee: Secondary | ICD-10-CM | POA: Diagnosis present

## 2017-03-26 DIAGNOSIS — M25661 Stiffness of right knee, not elsewhere classified: Secondary | ICD-10-CM | POA: Diagnosis present

## 2017-03-26 NOTE — Patient Instructions (Addendum)
Knee Extension (Sitting)    Place __0__ pound weight on left ankle and straighten knee fully, lower slowly. Repeat _10___ times per set. Do _1___ sets per session. Do __2__ sessions per day.  http://orth.exer.us/732   Copyright  VHI. All rights reserved.  Strengthening: Quadriceps Set    Tighten muscles on top of thighs by pushing knees down into surface. Hold _5___ seconds. Repeat _10___ times per set. Do _1___ sets per session. Do 2____ sessions per day.  http://orth.exer.us/602   Copyright  VHI. All rights reserved.  Self-Mobilization: Heel Slide (Supine)    Slide left heel toward buttocks until a gentle stretch is felt. Hold _3___ seconds. Relax. Repeat ___10_ times per set. Do _1___ sets per session. Do 2____ sessions per day.   http://orth.exer.us/622   Copyright  VHI. All rights reserved.  Strengthening: Hip Extension (Prone)    Tighten muscles on front of left thigh, then lift leg _3__ inches from surface, keeping knee locked. Repeat ___10_ times per set. Do __1__ sets per session. Do _2___ sessions per day.  http://orth.exer.us/620   Copyright  VHI. All rights reserved.  Self-Mobilization: Knee Flexion (Prone)   With 3 pounds on your ankle, Bring left heel toward buttocks as close as possible. Hold __3__ seconds. Relax. Repeat _10___ times per set. Do 1____ sets per session. Do 2____ sessions per day.  http://orth.exer.us/596   Copyright  VHI. All rights reserved.  Knee Extension Mobilization: Towel Prop    With rolled towel under right ankle, place _2-5___ pound weight across knee. Hold _30___ minutes. Repeat __1__ times per set. Do ___1_ sets per session. Do __2__ sessions per day.  http://orth.exer.us/720   Copyright  VHI. All rights reserved.  Self-Mobilization: Knee Flexion (Prone)   With 3 pounds on your ankle, Bring left heel toward buttocks as close as possible. Hold __3__ seconds. Relax. Repeat _10___ times per set. Do 1____  sets per session. Do 2____ sessions per day.  http://orth.exer.us/596   Copyright  VHI. All rights reserved.

## 2017-03-26 NOTE — Therapy (Signed)
[facility] [address] [place], [place], [address] Phone: [phone]   Fax:  [phone]  Physical Therapy Evaluation  Patient Details  Name: Jaime Massey MRN: [id number] Date of Birth: 1975/08/07 Referring Provider: Arther Abbott   Encounter Date: 03/26/2017      PT End of Session - 03/26/17 1517    Visit Number 1   Number of Visits 4   Authorization Type medicaid   Authorization - Visit Number 1   Authorization - Number of Visits 4   PT Start Time 1430   PT Stop Time 1515   PT Time Calculation (min) 45 min   Activity Tolerance Patient tolerated treatment well   Behavior During Therapy [facility] for tasks assessed/performed      Past Medical History:  Diagnosis Date  . Arthritis   . DVT (deep venous thrombosis) ([place])    post c-section  . PE (pulmonary embolism)    post c-section  . Seizures ([place])    as child from severe ear infections- no meds now and no seizures since age 37    Past Surgical History:  Procedure Laterality Date  . CESAREAN SECTION    . CHOLECYSTECTOMY N/A 12/21/2012   Procedure: LAPAROSCOPIC CHOLECYSTECTOMY;  Surgeon: Donato Heinz, MD;  Location: [facility];  Service: General;  Laterality: N/A;  . KNEE ARTHROSCOPY WITH MEDIAL MENISECTOMY Right 03/05/2017   Procedure: chondroplasty patella, medial femoral condyle;  Surgeon: Carole Civil, MD;  Location: [facility];  Service: Orthopedics;  Laterality: Right;    There were no vitals filed for this visit.       Subjective Assessment - 03/26/17 1435    Subjective Ms. Jaime Massey had arthroscopic surgery on her Rt knee on 03/05/2017. She is still having pain therefore she has been referred for skilled physical therapy.  She states that she is still having difficulty with the motion as well as walking.     How long can you sit comfortably? no problem as long as she can move her knee a little bit.    How long can you stand comfortably? Able to stand for 10  minutes but she immediately puts her weight on her left leg.    How long can you walk comfortably? Pt has walked for 30 minutes at a time.    Patient Stated Goals less pain, more motion, and to be able to walk better    Currently in Pain? Yes  worst pain 10/10 throbbing at night    Pain Score 1    Pain Location Knee   Pain Orientation Right   Pain Descriptors / Indicators Aching   Pain Type Chronic pain   Pain Onset More than a month ago   Pain Frequency Constant   Aggravating Factors  activity    Pain Relieving Factors ice    Effect of Pain on Daily Activities increases             [facility] PT Assessment - 03/26/17 0001      Assessment   Medical Diagnosis Rt knee arthroscopic surgery with menisectomy   Referring Provider Arther Abbott    Onset Date/Surgical Date 03/04/17   Next MD Visit 04/10/2017   Prior Therapy none      Precautions   Precautions None     Restrictions   Weight Bearing Restrictions No     Balance Screen   Has the patient fallen in the past 6 months Yes   How many times? 1  1  time since surgery pt states that she falls all the time    Has the patient had a decrease in activity level because of a fear of falling?  Yes   Is the patient reluctant to leave their home because of a fear of falling?  No     Home Environment   Living Environment Private residence   Type of [place] Access Stairs to enter   Entrance Stairs-Number of Steps 3   Entrance Stairs-Rails Right     Prior Function   Level of Independence Independent   Vocation Full time employment   Vocation Requirements CNA   Leisure none      Cognition   Overall Cognitive Status Within Functional Limits for tasks assessed     Functional Tests   Functional tests Single leg stance     Single Leg Stance   Comments Lt: 60" ; Rt: 3     ROM / Strength   AROM / PROM / Strength AROM;Strength     AROM   AROM Assessment Site Knee   Right/Left Knee Right   Right Knee Extension 10    Right Knee Flexion 120     Strength   Strength Assessment Site Hip;Knee;Ankle   Right/Left Hip Right  LT 5/5 for all    Right Hip Flexion 5/5   Right Hip Extension 5/5   Right Hip ABduction 5/5   Right/Left Knee Right   Right Knee Flexion 3+/5   Right Knee Extension 3+/5   Right/Left Ankle Right   Right Ankle Dorsiflexion 4+/5     Ambulation/Gait   Ambulation/Gait --  antalgic with decreased ankle and knee motion             Objective measurements completed on examination: See above findings.          [facility] Adult PT Treatment/Exercise - 03/26/17 0001      Exercises   Exercises Knee/Hip     Knee/Hip Exercises: Seated   Long Arc Quad Right;10 reps     Knee/Hip Exercises: Supine   Quad Sets Right;10 reps   Heel Slides Right;10 reps     Knee/Hip Exercises: Prone   Hamstring Curl 10 reps   Hip Extension 10 reps                PT Education - 03/26/17 1514    Education provided Yes   Education Details HEP increase icing   Person(s) Educated Patient   Methods Explanation;Demonstration;Handout   Comprehension Verbalized understanding;Returned demonstration          PT Short Term Goals - 03/26/17 1522      PT SHORT TERM GOAL #1   Title Pt ROM to be to 0 for right extension to allow normalized gait pattern.    Time 3   Period Weeks   Status New   Target Date 04/16/17     PT SHORT TERM GOAL #2   Title Pt pain to be no greater than a 5/10 to be able to sleep through the night    Time 3   Period Weeks   Status New     PT SHORT TERM GOAL #3   Title Pt strength to be increased to at least a 4/5 to allow pt to be able to go up and down steps in a reciprocal manner .   Time 3   Period Weeks   Status New     PT SHORT TERM GOAL #4   Title Pt  to be able to be weightbearing for up to 2 hours at a time to prepare for return to work duties.    Time 3   Period Weeks   Status New                   Plan - 03/26/17 1517    Clinical  Impression Statement Ms. Jaime Massey is a 41 yo female who has had recent arthroscopic surgery on 03/05/2017.   She is still having pain and decreased ROM affecting her functional ability therefore she has been referred to skilled physical therapy.  Examination demonstrates decreased ROM, decreased strength, decreased balance, increaed edema and increased pain.  Ms. Tesoriero will benefit from skilled physical therapy to progress her through a HEP to rehabilitate her knee to progress her to her prior level of function.    Clinical Presentation Stable   Clinical Decision Making Low   Rehab Potential Good   PT Frequency 2x / week   PT Duration 2 weeks   PT Treatment/Interventions ADLs/Self Care Home Management;Therapeutic activities;Therapeutic exercise;Balance training;Stair training;Gait training;Functional mobility training;Patient/family education   PT Next Visit Plan Begin weight bearing and balance exercises.  Give these as a HEP as pt will only have 3 visits due to insurance.    PT Home Exercise Plan LAQ, Q-set, heel slide, hip extension, knee flexion       Patient will benefit from skilled therapeutic intervention in order to improve the following deficits and impairments:  Abnormal gait, Decreased activity tolerance, Decreased balance, Decreased strength, Difficulty walking, Increased edema, Pain, Decreased range of motion  Visit Diagnosis: Stiffness of right knee, not elsewhere classified  Acute pain of right knee     Problem List Patient Active Problem List   Diagnosis Date Noted  . Chondromalacia of lateral femoral condyle, right   . 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 Chondromalacia patellae of right knee   . Rupture of anterior cruciate ligament of right knee   . Cyst of anterior horn of lateral meniscus of right knee   . Arthritis of knee, right 12/26/2016  . Bone spur 12/03/2016  . Fever of unknown origin 10/01/2016    Rayetta Humphrey, PT  CLT [phone] 03/26/2017, 3:29 PM  [facility] [place], [place], [address] Phone: [phone]   Fax:  [phone]  Name: JENNIAH BHAVSAR MRN: [id number] Date of Birth: [birth year]

## 2017-03-27 ENCOUNTER — Telehealth (HOSPITAL_COMMUNITY): Payer: Self-pay | Admitting: Family Medicine

## 2017-03-27 NOTE — Telephone Encounter (Signed)
03/27/17  I called and left patient a message explaining that we do not accept [facility] which is what comes up on the RTE.  I asked if she wanted to keep the 2 appts on her schedule but let her know that she would be responsible for payment as well as the appt from 9/19

## 2017-04-01 ENCOUNTER — Telehealth (HOSPITAL_COMMUNITY): Payer: Self-pay | Admitting: Family Medicine

## 2017-04-01 NOTE — Telephone Encounter (Signed)
04/01/17  I called patient at the end of last week to let her know that we didn't accept [employer].  She said that she has full medical coverage but that is not what keeps showing up on her insurance information.  I told her about being self pay since we don't accept that but she assure me she has full coverage.

## 2017-04-02 ENCOUNTER — Telehealth (HOSPITAL_COMMUNITY): Payer: Self-pay | Admitting: Family Medicine

## 2017-04-02 ENCOUNTER — Ambulatory Visit (HOSPITAL_COMMUNITY): Payer: Medicaid Other | Admitting: Physical Therapy

## 2017-04-02 NOTE — Telephone Encounter (Signed)
04/02/17  Insurance issue and patient may be self-pay and she didn't want to stay to be seen

## 2017-04-08 ENCOUNTER — Ambulatory Visit (HOSPITAL_COMMUNITY): Payer: Medicaid Other | Attending: Orthopedic Surgery | Admitting: Physical Therapy

## 2017-04-08 DIAGNOSIS — M25561 Pain in right knee: Secondary | ICD-10-CM | POA: Diagnosis present

## 2017-04-08 DIAGNOSIS — M25661 Stiffness of right knee, not elsewhere classified: Secondary | ICD-10-CM | POA: Insufficient documentation

## 2017-04-08 NOTE — Patient Instructions (Addendum)
Heel Raise: Bilateral (Standing)    Rise on balls of feet. Repeat _10___ times per set. Do _1___ sets per session. Do __2__ sessions per day.  http://orth.exer.us/38   Copyright  VHI. All rights reserved.  Balance: Unilateral    Attempt to balance on left leg, eyes open. Hold _30___ seconds. Repeat ___3_ times per set. Do ___1_ sets per session. Do _2___ sessions per day. Perform exercise with eyes closed.  http://orth.exer.us/28   Copyright  VHI. All rights reserved.  Functional Quadriceps: Chair Squat    Keeping feet flat on floor, shoulder width apart, squat as low as is comfortable. Use support as necessary. Repeat _10___ times per set. Do _1___ sets per session. Do __2__ sessions per day.  http://orth.exer.us/736   Copyright  VHI. All rights reserved.  Functional Quadriceps: Sit to Stand    Sit on edge of chair, feet flat on floor. Stand upright, extending knees fully. Repeat _10___ times per set. Do __1__ sets per session. Do ___2_ sessions per day.  http://orth.exer.us/734   Copyright  VHI. All rights reserved.  Forward Lunge    Standing with feet shoulder width apart and stomach tight, step forward with right leg. Repeat 10____ times per set. Do ___1_ sets per session. Do __2__ sessions per day.  http://orth.exer.us/1146   Copyright  VHI. All rights reserved.  Step-Down / Step-Up    Stand on stair step or 4-6____ inch stool. Slowly bend left leg, lowering other foot to floor. Return by straightening front leg. Repeat __10__ times per set. Do 1____ sets per session. Do _2___ sessions per day.  http://orth.exer.us/684   Copyright  VHI. All rights reserved.

## 2017-04-08 NOTE — Therapy (Signed)
[facility] [address] [place], [place], [address] Phone: [phone]   Fax:  [phone]  Physical Therapy Treatment  Patient Details  Name: Jaime Massey MRN: [id number] Date of Birth: Dec 22, 1975 Referring Provider: Arther Abbott   Encounter Date: 04/08/2017      PT End of Session - 04/08/17 1503    Visit Number 2   Number of Visits 4   Authorization Type medicaid   Authorization - Visit Number 2   Authorization - Number of Visits 4   PT Start Time 7062   PT Stop Time 1515   PT Time Calculation (min) 40 min   Activity Tolerance Patient tolerated treatment well   Behavior During Therapy [facility] for tasks assessed/performed      Past Medical History:  Diagnosis Date  . Arthritis   . DVT (deep venous thrombosis) ([place])    post c-section  . PE (pulmonary embolism)    post c-section  . Seizures ([place])    as child from severe ear infections- no meds now and no seizures since age 49    Past Surgical History:  Procedure Laterality Date  . CESAREAN SECTION    . CHOLECYSTECTOMY N/A 12/21/2012   Procedure: LAPAROSCOPIC CHOLECYSTECTOMY;  Surgeon: Donato Heinz, MD;  Location: [facility];  Service: General;  Laterality: N/A;  . KNEE ARTHROSCOPY WITH MEDIAL MENISECTOMY Right 03/05/2017   Procedure: chondroplasty patella, medial femoral condyle;  Surgeon: Carole Civil, MD;  Location: [facility];  Service: Orthopedics;  Laterality: Right;    There were no vitals filed for this visit.      Subjective Assessment - 04/08/17 1436    Subjective Pt states that she has been doing her exercises.  She is feeling better but still is stiff at night.    How long can you sit comfortably? no problem as long as she can move her knee a little bit.    How long can you stand comfortably? Able to stand for 10 minutes but she immediately puts her weight on her left leg.    How long can you walk comfortably? Pt has walked for 30 minutes at a time.    Patient Stated Goals less pain, more motion, and to be able to walk better    Currently in Pain? Yes   Pain Score 3    Pain Location Knee   Pain Orientation Right   Pain Descriptors / Indicators Aching   Pain Type Chronic pain   Pain Onset More than a month ago   Pain Frequency Intermittent            [facility] PT Assessment - 04/08/17 0001      AROM   Right Knee Extension 3   Right Knee Flexion 125                     [facility] Adult PT Treatment/Exercise - 04/08/17 0001      Exercises   Exercises Knee/Hip     Knee/Hip Exercises: Stretches   Knee: Self-Stretch to increase Flexion 3 reps;30 seconds   Other Knee/Hip Stretches slant board strecth 3x 30"      Knee/Hip Exercises: Standing   Heel Raises 10 reps   Forward Lunges 10 reps   Terminal Knee Extension Strengthening;Right;10 reps   Forward Step Up Step Height: 4";10 reps   Functional Squat 10 reps   Rocker Board 2 minutes   SLS 36" RT,      Knee/Hip Exercises: Seated  Sit to [employer] 10 reps     Knee/Hip Exercises: Supine   Bridges 10 reps     Knee/Hip Exercises: Prone   Hamstring Curl 10 reps   Hamstring Curl Limitations 4#   Hip Extension Right;10 reps   Hip Extension Limitations 4#                  PT Short Term Goals - 04/08/17 1505      PT SHORT TERM GOAL #1   Title Pt ROM to be to 0 for right extension to allow normalized gait pattern.    Time 3   Period Weeks   Status On-going     PT SHORT TERM GOAL #2   Title Pt pain to be no greater than a 5/10 to be able to sleep through the night    Time 3   Period Weeks   Status On-going     PT SHORT TERM GOAL #3   Title Pt strength to be increased to at least a 4/5 to allow pt to be able to go up and down steps in a reciprocal manner .   Time 3   Period Weeks   Status On-going     PT SHORT TERM GOAL #4   Title Pt to be able to be weightbearing for up to 2 hours at a time to prepare for return to work duties.    Time 3   Period  Weeks   Status On-going                  Plan - 04/08/17 1503    Clinical Impression Statement Evaluation and goals were reviewed with patient.  Home exercise program was updated.  Pt has improved significantly in all aspects but continues to have decreased knee extension, swelling and pain.  She will continue to benefit from skilled therapy.    Rehab Potential Good   PT Frequency 2x / week   PT Duration 2 weeks   PT Treatment/Interventions ADLs/Self Care Home Management;Therapeutic activities;Therapeutic exercise;Balance training;Stair training;Gait training;Functional mobility training;Patient/family education   PT Next Visit Plan begin vector stances and steps as well as lateral step ups    PT Home Exercise Plan LAQ, Q-set, heel slide, hip extension, knee flexion       Patient will benefit from skilled therapeutic intervention in order to improve the following deficits and impairments:  Abnormal gait, Decreased activity tolerance, Decreased balance, Decreased strength, Difficulty walking, Increased edema, Pain, Decreased range of motion  Visit Diagnosis: Stiffness of right knee, not elsewhere classified  Acute pain of right knee     Problem List Patient Active Problem List   Diagnosis Date Noted  . Chondromalacia of lateral femoral condyle, right   . 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 Chondromalacia patellae of right knee   . Rupture of anterior cruciate ligament of right knee   . Cyst of anterior horn of lateral meniscus of right knee   . Arthritis of knee, right 12/26/2016  . Bone spur 12/03/2016  . Fever of unknown origin 10/01/2016  Rayetta Humphrey, PT CLT [phone] 04/08/2017, 3:13 PM  [facility] [address] [place], [place], [address] Phone: [phone]   Fax:  [phone]  Name: ADAMARIS KING MRN: [id number] Date of Birth: 20-Sep-1975

## 2017-04-10 ENCOUNTER — Telehealth (HOSPITAL_COMMUNITY): Payer: Self-pay | Admitting: Family Medicine

## 2017-04-10 ENCOUNTER — Ambulatory Visit (HOSPITAL_COMMUNITY): Payer: Medicaid Other

## 2017-04-10 NOTE — Telephone Encounter (Signed)
04/10/17  pt called to cx and we rescheduled for 10/11 -- she didn't give a reason

## 2017-04-15 ENCOUNTER — Ambulatory Visit (HOSPITAL_COMMUNITY): Payer: Medicaid Other | Admitting: Physical Therapy

## 2017-04-15 ENCOUNTER — Telehealth (HOSPITAL_COMMUNITY): Payer: Self-pay | Admitting: Physical Therapy

## 2017-04-15 NOTE — Telephone Encounter (Signed)
Called pt re: missed appointment.  Advised pt that her next appointment is tomorrow at 1:00PM.   Rayetta Humphrey, [place] CLT [phone]

## 2017-04-16 ENCOUNTER — Encounter: Payer: Self-pay | Admitting: Orthopedic Surgery

## 2017-04-16 ENCOUNTER — Ambulatory Visit (INDEPENDENT_AMBULATORY_CARE_PROVIDER_SITE_OTHER): Payer: Medicaid Other | Admitting: Orthopedic Surgery

## 2017-04-16 ENCOUNTER — Ambulatory Visit (HOSPITAL_COMMUNITY): Payer: Medicaid Other | Admitting: Physical Therapy

## 2017-04-16 VITALS — BP 134/84 | HR 90 | Ht 69.0 in | Wt 215.0 lb

## 2017-04-16 DIAGNOSIS — M25661 Stiffness of right knee, not elsewhere classified: Secondary | ICD-10-CM | POA: Diagnosis not present

## 2017-04-16 DIAGNOSIS — Z4889 Encounter for other specified surgical aftercare: Secondary | ICD-10-CM

## 2017-04-16 DIAGNOSIS — M25561 Pain in right knee: Secondary | ICD-10-CM

## 2017-04-16 DIAGNOSIS — Z9889 Other specified postprocedural states: Secondary | ICD-10-CM

## 2017-04-16 NOTE — Progress Notes (Signed)
Postop visit  Arthroscopy right knee  Chief Complaint  Patient presents with  . Follow-up    SARK 03/05/17    She has no real complaints are pain has improved her range of motion is good she is walking without support she has no swelling or mechanical symptoms in her knee  I released her to normal routine care and activity

## 2017-04-16 NOTE — Therapy (Addendum)
[facility] [address] [place], [place], [address] Phone: [phone]   Fax:  [phone]  Physical Therapy Treatment  Patient Details  Name: Jaime Massey MRN: [id number] Date of Birth: 19-Jan-1976 Referring Provider: Arther Abbott   Encounter Date: 04/16/2017      PT End of Session - 04/16/17 1337    Visit Number 3   Number of Visits 4   Authorization Type Medicaid: 8 more visits have been requested if needed   Authorization - Visit Number 3   Authorization - Number of Visits 4   PT Start Time 1300   PT Stop Time 1338   PT Time Calculation (min) 38 min   Activity Tolerance Patient tolerated treatment well   Behavior During Therapy [facility] for tasks assessed/performed      Past Medical History:  Diagnosis Date  . Arthritis   . DVT (deep venous thrombosis) ([place])    post c-section  . PE (pulmonary embolism)    post c-section  . Seizures ([place])    as child from severe ear infections- no meds now and no seizures since age 70    Past Surgical History:  Procedure Laterality Date  . CESAREAN SECTION    . CHOLECYSTECTOMY N/A 12/21/2012   Procedure: LAPAROSCOPIC CHOLECYSTECTOMY;  Surgeon: Donato Heinz, MD;  Location: [facility];  Service: General;  Laterality: N/A;  . KNEE ARTHROSCOPY WITH MEDIAL MENISECTOMY Right 03/05/2017   Procedure: chondroplasty patella, medial femoral condyle;  Surgeon: Carole Civil, MD;  Location: [facility];  Service: Orthopedics;  Laterality: Right;    There were no vitals filed for this visit.      Subjective Assessment - 04/16/17 1301    Subjective Pt states that she is doing better.  Her knee is achey due to the wet weather.  Pt is doing her HEP    How long can you sit comfortably? no problem as long as she can move her knee a little bit.    How long can you stand comfortably? Able to stand for 10 minutes but she immediately puts her weight on her left leg.    How long can you walk comfortably?  Pt has walked for 30 minutes at a time.    Patient Stated Goals less pain, more motion, and to be able to walk better    Pain Score 2    Pain Location Knee   Pain Orientation Right   Pain Descriptors / Indicators Aching   Pain Onset More than a month ago                         [facility] Adult PT Treatment/Exercise - 04/16/17 0001      Exercises   Exercises Knee/Hip     Knee/Hip Exercises: Stretches   Knee: Self-Stretch to increase Flexion 3 reps;30 seconds   Other Knee/Hip Stretches slant board strecth 3x 30"      Knee/Hip Exercises: Aerobic   Other Aerobic sports cord walk down back hall      Knee/Hip Exercises: Standing   Heel Raises 15 reps   Knee Flexion Right;10 reps   Knee Flexion Limitations 4#    Forward Lunges 10 reps   Side Lunges 10 reps   Terminal Knee Extension Strengthening;Right;10 reps   Lateral Step Up Step Height: 6";15 reps   Forward Step Up Step Height: 6";15 reps   Functional Squat 15 reps   Rocker Board 2 minutes   SLS  B 60"; repeated on foam Lt: 60"     Knee/Hip Exercises: Seated   Sit to [employer] 10 reps     Knee/Hip Exercises: Supine   Quad Sets 10 reps   Bridges --   Single Leg Bridge 10 reps   Knee Extension Limitations 0   Knee Flexion Limitations 130     Knee/Hip Exercises: Sidelying   Hip ABduction 15 reps     Knee/Hip Exercises: Prone   Hamstring Curl 10 reps   Hamstring Curl Limitations 4#   Hip Extension Right;10 reps   Hip Extension Limitations 4#                  PT Short Term Goals - 04/08/17 1505      PT SHORT TERM GOAL #1   Title Pt ROM to be to 0 for right extension to allow normalized gait pattern.    Time 3   Period Weeks   Status On-going     PT SHORT TERM GOAL #2   Title Pt pain to be no greater than a 5/10 to be able to sleep through the night    Time 3   Period Weeks   Status On-going     PT SHORT TERM GOAL #3   Title Pt strength to be increased to at least a 4/5 to allow pt to be  able to go up and down steps in a reciprocal manner .   Time 3   Period Weeks   Status On-going     PT SHORT TERM GOAL #4   Title Pt to be able to be weightbearing for up to 2 hours at a time to prepare for return to work duties.    Time 3   Period Weeks   Status On-going                  Plan - 04/16/17 1338    Clinical Impression Statement Increased step height, added balance activity as well as weight to knee flexion..  Pt is progressing well with ROM now 0-130.     Rehab Potential Good   PT Frequency 2x / week   PT Duration 2 weeks   PT Treatment/Interventions ADLs/Self Care Home Management;Therapeutic activities;Therapeutic exercise;Balance training;Stair training;Gait training;Functional mobility training;Patient/family education   PT Next Visit Plan begin vector stances and stairs.    PT Home Exercise Plan LAQ, Q-set, heel slide, hip extension, knee flexion       Patient will benefit from skilled therapeutic intervention in order to improve the following deficits and impairments:  Abnormal gait, Decreased activity tolerance, Decreased balance, Decreased strength, Difficulty walking, Increased edema, Pain, Decreased range of motion  Visit Diagnosis: Stiffness of right knee, not elsewhere classified  Acute pain of right knee     Problem List Patient Active Problem List   Diagnosis Date Noted  . Chondromalacia of lateral femoral condyle, right   . 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 Chondromalacia patellae of right knee   . Rupture of anterior cruciate ligament of right knee   . Cyst of anterior horn of lateral meniscus of right knee   . Arthritis of knee, right 12/26/2016  . Bone spur 12/03/2016  . Fever of unknown origin 10/01/2016  Rayetta Humphrey, PT CLT [phone] 04/16/2017, 1:46 PM  [facility] [address] [place], [place], [address] Phone: [phone]   Fax:  [phone]  Name:  Jaime Massey MRN: [id number] Date of  Birth: [birth year]   PHYSICAL THERAPY DISCHARGE SUMMARY  Visits from Start of Care: 3  Current functional level related to goals / functional outcomes: See above  Remaining deficits: See above   Education / Equipment: HEP Plan: Patient agrees to discharge.  Patient goals were not met. Patient is being discharged due to not returning since the last visit.  ?????       Rayetta Humphrey, [place] CLT [phone]

## 2017-04-23 ENCOUNTER — Ambulatory Visit (HOSPITAL_COMMUNITY): Payer: Medicaid Other

## 2017-04-23 ENCOUNTER — Telehealth (HOSPITAL_COMMUNITY): Payer: Self-pay

## 2017-04-23 NOTE — Telephone Encounter (Signed)
No show, called and left message concerning missed apt today.  Pt with further apts scheduled, encouraged pt to call and schedule more as she has approval for a total of 8 visits through Medicaid.  Included contact information.    [address] [place]; CBIS [phone]

## 2017-12-19 DIAGNOSIS — E669 Obesity, unspecified: Secondary | ICD-10-CM | POA: Insufficient documentation

## 2018-02-12 DIAGNOSIS — Z9889 Other specified postprocedural states: Secondary | ICD-10-CM | POA: Insufficient documentation

## 2018-02-16 ENCOUNTER — Ambulatory Visit: Payer: Medicaid Other | Admitting: Orthopedic Surgery

## 2018-02-16 VITALS — BP 120/78 | HR 89 | Ht 69.0 in | Wt 223.0 lb

## 2018-02-16 DIAGNOSIS — M1711 Unilateral primary osteoarthritis, right knee: Secondary | ICD-10-CM | POA: Diagnosis not present

## 2018-02-16 DIAGNOSIS — Z9889 Other specified postprocedural states: Secondary | ICD-10-CM | POA: Diagnosis not present

## 2018-02-16 MED ORDER — PREDNISONE 10 MG PO TABS: 10.0000 mg | ORAL_TABLET | Freq: Every day | ORAL | 0 refills | Status: DC

## 2018-02-16 NOTE — Progress Notes (Signed)
Chief Complaint  Patient presents with  . Follow-up    Recheck on right knee, DOS 03-05-17.   42 year old female had arthroscopy last year she had a chondroplasty in the patellofemoral area Complains of pain inferior aspect of the patella and intermittent swelling usually resolve with ibuprofen  Past Medical History:  Diagnosis Date  . Arthritis   . DVT (deep venous thrombosis) ([place])    post c-section  . PE (pulmonary embolism)    post c-section  . Seizures ([place])    as child from severe ear infections- no meds now and no seizures since age 24    Review of Systems  Constitutional: Negative for chills and fever.  Musculoskeletal:       She denies catching locking and giving way    PRE-OPERATIVE DIAGNOSIS:  RIGHT MEDIAL AND LATERAL MENISCUS TEARS   POST-OPERATIVE DIAGNOSIS:  Chondromalacia patella, medial femoral condyle, lateral femoral condyle, chronic anterior cruciate ligament tear, lateral meniscus anterior horn meniscal cyst   PROCEDURE:  Procedure(s): 29877 [phone]     KNEE ARTHROSCOPY WITH chondroplasty of the patella and medial femoral condyle, removal of meniscal cyst lateral meniscus, exam under anesthesia   Operative findings  grade 2 lesion of the median ridge of the patella,  grade 2 chondral lesion medial femoral condyle,  grade 2 lesion lateral femoral condyle,  old anterior cruciate ligament tear with proximal femoral attachment scar to the PCL,  anterior horn lateral meniscus cyst. Exam under anesthesia revealed a stable Lachman and stable pivot shift test.  Physical Exam  BP 120/78   Pulse 89   Ht 5' 9"  (1.753 m)   Wt 223 lb (101.2 kg)   BMI 32.93 kg/m   Constitutional: she is oriented to person, place, and time. He appears well-developed and well-nourished.  Vital signs have been reviewed and are stable. Gen. appearance the patient is well-developed and well-nourished with normal grooming and hygiene.   Musculoskeletal:  Examination of the right  and left knee reveal that the left knee has more extension than the right with 5 degrees of hyperextension  There right knee is tender over the medial femoral condyle she has some crepitation on patellofemoral compression with pain medial patellofemoral joint she has full range of motion otherwise in the knee is stable in flexion extension and the collateral ligaments are stable her quadricep strength is normal the skin is intact she is a good pulse normal sensation.   Encounter Diagnoses  Name Primary?  . S/P knee surgery patella chondroplasty 03/05/17   . Primary osteoarthritis of right knee Yes   Return in 1 year for x-ray start prednisone grams as needed if swelling does not go down with ibuprofen

## 2018-09-07 ENCOUNTER — Ambulatory Visit (INDEPENDENT_AMBULATORY_CARE_PROVIDER_SITE_OTHER): Payer: Medicaid Other

## 2018-09-07 ENCOUNTER — Ambulatory Visit: Payer: Medicaid Other | Admitting: Orthopedic Surgery

## 2018-09-07 VITALS — BP 113/74 | HR 98 | Ht 69.0 in | Wt 233.0 lb

## 2018-09-07 DIAGNOSIS — M1711 Unilateral primary osteoarthritis, right knee: Secondary | ICD-10-CM

## 2018-09-07 MED ORDER — MELOXICAM 7.5 MG PO TABS: 7.5000 mg | ORAL_TABLET | Freq: Every day | ORAL | 5 refills | Status: DC

## 2018-09-07 NOTE — Progress Notes (Signed)
No chief complaint on file.   43 year old female status post arthroscopy of the right knee in 2018 presents complaining of intermittent swelling of the right knee.  She has been taken ibuprofen over-the-counter 800 mg at a time.  Denies any new trauma.  She has been trying to go to the gym and use the treadmill which is bothering the knee as well.  She also reports some locking sensations in the knee over the last few weeks.   OPERATIVE NOTE AND FINDINGS:    PRE-OPERATIVE DIAGNOSIS:  RIGHT MEDIAL AND LATERAL MENISCUS TEARS   POST-OPERATIVE DIAGNOSIS:  Chondromalacia patella, medial femoral condyle, lateral femoral condyle, chronic anterior cruciate ligament tear, lateral meniscus anterior horn meniscal cyst   PROCEDURE:  Procedure(s): 29877 [phone]     KNEE ARTHROSCOPY WITH chondroplasty of the patella and medial femoral condyle, removal of meniscal cyst lateral meniscus, exam under anesthesia   Operative findings  grade 2 lesion of the median ridge of the patella,  grade 2 chondral lesion medial femoral condyle,  grade 2 lesion lateral femoral condyle,  old anterior cruciate ligament tear with proximal femoral attachment scar to the PCL,  anterior horn lateral meniscus cyst. Exam under anesthesia revealed a stable Lachman and stable pivot shift test.   Past Medical History:  Diagnosis Date  . Arthritis   . DVT (deep venous thrombosis) ([place])    post c-section  . PE (pulmonary embolism)    post c-section  . Seizures ([facility])    as child from severe ear infections- no meds now and no seizures since age 71    Past Surgical History:  Procedure Laterality Date  . CESAREAN SECTION    . CHOLECYSTECTOMY N/A 12/21/2012   Procedure: LAPAROSCOPIC CHOLECYSTECTOMY;  Surgeon: Donato Heinz, MD;  Location: [facility];  Service: General;  Laterality: N/A;  . KNEE ARTHROSCOPY WITH MEDIAL MENISECTOMY Right 03/05/2017   Procedure: chondroplasty patella, medial femoral condyle;  Surgeon: Carole Civil, MD;  Location: [facility];  Service: Orthopedics;  Laterality: Right;    Family History  Problem Relation Age of Onset  . Cancer Maternal Grandmother   . Cancer Maternal Grandfather   . Diabetes Paternal Grandmother   . Heart disease Paternal Grandfather     Social History   Tobacco Use  . Smoking status: Current Every Day Smoker    Packs/day: 0.50    Years: 26.00    Pack years: 13.00    Types: Cigarettes  . Smokeless tobacco: Never Used  Substance Use Topics  . Alcohol use: Yes    Comment: reports occasional beer but very seldom  . Drug use: No    BP 113/74   Pulse 98   Ht 5' 9"  (1.753 m)   Wt 233 lb (105.7 kg)   BMI 34.41 kg/m   Appearance normal, oriented person place and time.  Mood normal affect normal.  Gait normal.  Right knee exam shows no major abnormalities alignment was normal she exhibited full range of motion ligaments were stable muscle tone and strength were normal without tremor skin was clean pulses were good and sensation was normal  Left knee exam showed tenderness and crepitance in the patellofemoral region both condyles.  Full range of motion no effusion.  Ligaments were stable including the ACL drawer test.  Muscle tone and strength were normal without tremor skin was warm dry and intact portal sites were visible with healed scars.  Sensation was intact pulses are good color capillary refill normal  Radiographs were  obtained in the office 3 views of the right knee mild arthritis and small joint effusion noted on x-ray  Procedure note right knee injection verbal consent was obtained to inject right knee joint  Timeout was completed to confirm the site of injection  The medications used were 40 mg of Depo-Medrol and 1% lidocaine 3 cc  Anesthesia was provided by ethyl chloride and the skin was prepped with alcohol.  After cleaning the skin with alcohol a 20-gauge needle was used to inject the right knee joint. There were no complications. A  sterile bandage was applied.  Meds ordered this encounter  Medications  . meloxicam (MOBIC) 7.5 MG tablet    Sig: Take 1 tablet (7.5 mg total) by mouth daily.    Dispense:  30 tablet    Refill:  5    Follow-up 1 year with right knee x-ray

## 2018-09-07 NOTE — Patient Instructions (Addendum)
Arthritis of the right knee  Please be sure to keep your weight down, for exercise using elliptical or stationary bike light weight training is fine  Start the meloxicam once a day  You have received an injection of steroids into the joint. 15% of patients will have increased pain within the 24 hours postinjection.   This is transient and will go away.   We recommend that you use ice packs on the injection site for 20 minutes every 2 hours and extra strength Tylenol 2 tablets every 8 as needed until the pain resolves.  If you continue to have pain after taking the Tylenol and using the ice please call the office for further instructions.

## 2018-12-22 ENCOUNTER — Other Ambulatory Visit: Payer: Self-pay | Admitting: Orthopedic Surgery

## 2018-12-28 ENCOUNTER — Other Ambulatory Visit: Payer: Self-pay | Admitting: Orthopedic Surgery

## 2018-12-28 MED ORDER — PREDNISONE 10 MG PO TABS: 10.0000 mg | ORAL_TABLET | Freq: Every day | ORAL | 0 refills | Status: DC

## 2018-12-28 NOTE — Telephone Encounter (Signed)
Prednisone 10 mg  Qty 60 Tablets  Take 1 tablet by mouth once daily as needed if swelling in right knee does not improve with ibuprofen.  PATIENT USES [facility]  Last ov was 09/07/18

## 2019-02-22 ENCOUNTER — Ambulatory Visit: Payer: Medicaid Other | Admitting: Orthopedic Surgery

## 2019-03-18 ENCOUNTER — Other Ambulatory Visit: Payer: Self-pay | Admitting: Orthopedic Surgery

## 2019-03-18 DIAGNOSIS — M1711 Unilateral primary osteoarthritis, right knee: Secondary | ICD-10-CM

## 2019-04-21 ENCOUNTER — Other Ambulatory Visit: Payer: Self-pay | Admitting: Orthopedic Surgery

## 2019-04-21 DIAGNOSIS — M1711 Unilateral primary osteoarthritis, right knee: Secondary | ICD-10-CM

## 2019-05-17 ENCOUNTER — Other Ambulatory Visit: Payer: Self-pay | Admitting: Orthopedic Surgery

## 2019-05-17 DIAGNOSIS — M1711 Unilateral primary osteoarthritis, right knee: Secondary | ICD-10-CM

## 2019-06-16 ENCOUNTER — Other Ambulatory Visit: Payer: Self-pay | Admitting: Orthopedic Surgery

## 2019-06-16 DIAGNOSIS — M1711 Unilateral primary osteoarthritis, right knee: Secondary | ICD-10-CM

## 2019-07-08 ENCOUNTER — Other Ambulatory Visit: Payer: Self-pay | Admitting: Orthopedic Surgery

## 2019-07-08 DIAGNOSIS — M1711 Unilateral primary osteoarthritis, right knee: Secondary | ICD-10-CM

## 2019-08-11 ENCOUNTER — Other Ambulatory Visit: Payer: Self-pay | Admitting: Orthopedic Surgery

## 2019-08-11 DIAGNOSIS — M1711 Unilateral primary osteoarthritis, right knee: Secondary | ICD-10-CM

## 2019-09-08 ENCOUNTER — Ambulatory Visit: Payer: Medicaid Other

## 2019-09-08 ENCOUNTER — Encounter: Payer: Self-pay | Admitting: Orthopedic Surgery

## 2019-09-08 ENCOUNTER — Ambulatory Visit: Payer: Medicaid Other | Admitting: Orthopedic Surgery

## 2019-09-08 ENCOUNTER — Other Ambulatory Visit: Payer: Self-pay

## 2019-09-08 DIAGNOSIS — M1711 Unilateral primary osteoarthritis, right knee: Secondary | ICD-10-CM

## 2019-09-08 DIAGNOSIS — M1710 Unilateral primary osteoarthritis, unspecified knee: Secondary | ICD-10-CM

## 2019-09-08 MED ORDER — MELOXICAM 7.5 MG PO TABS: 7.5000 mg | ORAL_TABLET | Freq: Every day | ORAL | 0 refills | Status: DC

## 2019-09-08 NOTE — Patient Instructions (Signed)
Resume meloxicam   You have received an injection of steroids into the joint. 15% of patients will have increased pain within the 24 hours postinjection.   This is transient and will go away.   We recommend that you use ice packs on the injection site for 20 minutes every 2 hours and extra strength Tylenol 2 tablets every 8 as needed until the pain resolves.  If you continue to have pain after taking the Tylenol and using the ice please call the office for further instructions.

## 2019-09-08 NOTE — Progress Notes (Signed)
Chief complaint recheck right knee  44 year old female had an arthroscopy in 2018 and had to have Have chondroplasty of the patella the medial femoral condyle she had a meniscal cyst on exam under anesthesia she had a stable Lachman stable Pivot shift despite an old cruciate ligament tear.  It was proximal with scarring to the PCL the chondral lesions were grade 2 patella femur medial and lateral condyles  She was started on meloxicam she had an injection done as well and she was advised to come back in a year for x-ray to monitor the arthritis.  Past Medical History:  Diagnosis Date  . Arthritis   . DVT (deep venous thrombosis) ([place])    post c-section  . PE (pulmonary embolism)    post c-section  . Seizures ([place])    as child from severe ear infections- no meds now and no seizures since age 61    BP 116/80   Pulse 80   Temp (!) 97.2 F (36.2 C)   Ht 5' 9"  (1.753 m)   Wt 220 lb (99.8 kg)   BMI 32.49 kg/m   General appearance is normal She is oriented x3 Mood affect pleasant and normal Gait and station no limp Exam right knee No effusion  Stable From Medial side tender   X-ray right knee OA worse   Treatment plan meloxicam Injection Fu 1 yr   Meds ordered this encounter  Medications  . DISCONTD: meloxicam (MOBIC) 7.5 MG tablet    Sig: Take 1 tablet (7.5 mg total) by mouth daily.    Dispense:  30 tablet    Refill:  0  . meloxicam (MOBIC) 7.5 MG tablet    Sig: Take 1 tablet (7.5 mg total) by mouth daily.    Dispense:  90 tablet    Refill:  0   Procedure note right knee injection   verbal consent was obtained to inject right knee joint  Timeout was completed to confirm the site of injection  The medications used were 40 mg of Depo-Medrol and 1% lidocaine 3 cc  Anesthesia was provided by ethyl chloride and the skin was prepped with alcohol.  After cleaning the skin with alcohol a 20-gauge needle was used to inject the right knee joint. There were no  complications. A sterile bandage was applied.

## 2019-09-22 ENCOUNTER — Encounter: Payer: Self-pay | Admitting: Orthopedic Surgery

## 2019-09-22 ENCOUNTER — Ambulatory Visit: Payer: Medicaid Other | Admitting: Orthopedic Surgery

## 2019-09-22 ENCOUNTER — Ambulatory Visit: Payer: Medicaid Other

## 2019-09-22 ENCOUNTER — Other Ambulatory Visit: Payer: Self-pay

## 2019-09-22 VITALS — Temp 97.2°F | Ht 69.0 in | Wt 223.0 lb

## 2019-09-22 DIAGNOSIS — M25562 Pain in left knee: Secondary | ICD-10-CM

## 2019-09-22 NOTE — Progress Notes (Signed)
Jaime Massey  09/22/2019  Body mass index is 32.93 kg/m.   HISTORY SECTION :  Chief Complaint  Patient presents with  . Knee Pain    left/ has had pain for years    HPI 44 year old female currently on meloxicam but also take some ibuprofen intermittently for moderate to severe pain in the left knee which she has had for several years may be worsening over the last few months  She had an injury when she was younger and since that time has had intermittent dull achy knee pain in the front and side of the knee  She notes associated swelling at times  Review of Systems  Cardiovascular: Positive for leg swelling.  All other systems reviewed and are negative.    has a past medical history of Arthritis, DVT (deep venous thrombosis) ([place]), PE (pulmonary embolism), and Seizures ([place]).   Past Surgical History:  Procedure Laterality Date  . CESAREAN SECTION    . CHOLECYSTECTOMY N/A 12/21/2012   Procedure: LAPAROSCOPIC CHOLECYSTECTOMY;  Surgeon: Donato Heinz, MD;  Location: [facility];  Service: General;  Laterality: N/A;  . KNEE ARTHROSCOPY WITH MEDIAL MENISECTOMY Right 03/05/2017   Procedure: chondroplasty patella, medial femoral condyle;  Surgeon: Carole Civil, MD;  Location: [facility];  Service: Orthopedics;  Laterality: Right;    Body mass index is 32.93 kg/m.   Allergies  Allergen Reactions  . Amoxicillin Hives  . Penicillins Rash     Current Outpatient Medications:  .  acetaminophen (TYLENOL) 325 MG tablet, Take 650 mg by mouth every 6 (six) hours as needed., Disp: , Rfl:  .  buPROPion (WELLBUTRIN SR) 100 MG 12 hr tablet, , Disp: , Rfl:  .  ibuprofen (ADVIL,MOTRIN) 800 MG tablet, Take 1 tablet (800 mg total) by mouth every 8 (eight) hours as needed., Disp: 90 tablet, Rfl: 1 .  meloxicam (MOBIC) 7.5 MG tablet, Take 1 tablet (7.5 mg total) by mouth daily., Disp: 90 tablet, Rfl: 0 .  omeprazole (PRILOSEC) 20 MG capsule, Take 20 mg by mouth daily., Disp: , Rfl:  .   predniSONE (DELTASONE) 10 MG tablet, TAKE 1 TABLET BY MOUTH ONCE DAILY AS NEEDED IF SWELLING IN RIGHT KNEE DOES NOT IMPROVE WITH IBUPROFEN, Disp: 60 tablet, Rfl: 0 .  traZODone (DESYREL) 50 MG tablet, Take 50 mg by mouth at bedtime., Disp: , Rfl:    PHYSICAL EXAM SECTION: 1) Temp (!) 97.2 F (36.2 C)   Ht 5' 9"  (1.753 m)   Wt 223 lb (101.2 kg)   BMI 32.93 kg/m   Body mass index is 32.93 kg/m. General appearance: Well-developed well-nourished no gross deformities  2) Cardiovascular normal pulse and perfusion , normal color   3) Neurologically deep tendon reflexes are equal and normal, no sensation loss or deficits no pathologic reflexes  4) Psychological: Awake alert and oriented x3 mood and affect normal  5) Skin no lacerations or ulcerations no nodularity no palpable masses, no erythema or nodularity  6) Musculoskeletal:   Gait is normal   Left knee no joint effusion ligaments are stable crepitance and positive quadriceps contraction test positive grind test for patellofemoral pain mild joint pain to palpation medial joint line she has full range of motion there is no subluxation dislocation or laxity in the patella   MEDICAL DECISION MAKING  A.  Encounter Diagnosis  Name Primary?  . Left knee pain, unspecified chronicity Yes    B. DATA ANALYSED:  IMAGING: Independent interpretation of images: X-rays were done in-house  she has 10 degrees valgus alignment mild joint space narrowing symmetrically peaking of the tibial spines osteophyte medially on the axial view of the patella osteophyte is on the femur consistent with arthritis of the joint  Orders: None  Outside records reviewed: No  C. MANAGEMENT   Discussed possible treatment options including changing her from meloxicam, she has had patient education regarding mixing anti-inflammatories before this was reiterated  She was okay with getting an injection in the joint and continuing with the meloxicam  Procedure  note left knee injection   verbal consent was obtained to inject left knee joint  Timeout was completed to confirm the site of injection  The medications used were 40 mg of Depo-Medrol and 1% lidocaine 3 cc  Anesthesia was provided by ethyl chloride and the skin was prepped with alcohol.  After cleaning the skin with alcohol a 20-gauge needle was used to inject the left knee joint. There were no complications. A sterile bandage was applied.   No orders of the defined types were placed in this encounter.     Arther Abbott, MD  09/22/2019 9:14 AM

## 2019-09-22 NOTE — Patient Instructions (Addendum)
Chronic Knee Pain, Adult Chronic knee pain is pain in one or both knees that lasts longer than 3 months. Symptoms of chronic knee pain may include swelling, stiffness, and discomfort. Age-related wear and tear (osteoarthritis) of the knee joint is the most common cause of chronic knee pain. Other possible causes include:  A long-term immune-related disease that causes inflammation of the knee (rheumatoid arthritis). This usually affects both knees.  Inflammatory arthritis, such as gout or pseudogout.  An injury to the knee that causes arthritis.  An injury to the knee that damages the ligaments. Ligaments are strong tissues that connect bones to each other.  Runner's knee or pain behind the kneecap. Treatment for chronic knee pain depends on the cause. The main treatments for chronic knee pain are physical therapy and weight loss. This condition may also be treated with medicines, injections, a knee sleeve or brace, and by using crutches. Rest, ice, compression (pressure), and elevation (RICE) therapy may also be recommended. Follow these instructions at home: If you have a knee sleeve or brace:   Wear it as told by your health care provider. Remove it only as told by your health care provider.  Loosen it if your toes tingle, become numb, or turn cold and blue.  Keep it clean.  If the sleeve or brace is not waterproof: ? Do not let it get wet. ? Remove it if allowed by your health care provider, or cover it with a watertight covering when you take a bath or a shower. Managing pain, stiffness, and swelling      If directed, apply heat to the affected area as often as told by your health care provider. Use the heat source that your health care provider recommends, such as a moist heat pack or a heating pad. ? If you have a removable sleeve or brace, remove it as told by your health care provider. ? Place a towel between your skin and the heat source. ? Leave the heat on for 20-30  minutes. ? Remove the heat if your skin turns bright red. This is especially important if you are unable to feel pain, heat, or cold. You may have a greater risk of getting burned.  If directed, put ice on the affected area. ? If you have a removable sleeve or brace, remove it as told by your health care provider. ? Put ice in a plastic bag. ? Place a towel between your skin and the bag. ? Leave the ice on for 20 minutes, 2-3 times a day.  Move your toes often to reduce stiffness and swelling.  Raise (elevate) the injured area above the level of your heart while you are sitting or lying down. Activity  Avoid activities where both feet leave the ground at the same time (high-impact activities). Examples are running, jumping rope, and doing jumping jacks.  Return to your normal activities as told by your health care provider. Ask your health care provider what activities are safe for you.  Follow the exercise plan that your health care provider designed for you. Your health care provider may suggest that you: ? Avoid activities that make knee pain worse. This may require you to change your exercise routines, sport participation, or job duties. ? Wear shoes with cushioned soles. ? Avoid high-impact activities or sports that require running and sudden changes in direction. ? Do physical therapy as told by your health care provider. Physical therapy is planned to match your needs and abilities. It may include  exercises for strength, flexibility, stability, and endurance. ? Do exercises that increase balance and strength, such as tai chi and yoga.  Do not use the injured limb to support your body weight until your health care provider says that you can. Use crutches, a cane, or a walker, as told by your health care provider. General instructions  Take over-the-counter and prescription medicines only as told by your health care provider.  Lose weight if you are overweight. Losing even a little  weight can reduce knee pain. Ask your health care provider what your ideal weight is, and how to safely lose extra weight. A food expert (dietitian) may be able to help you plan your meals.  Do not use any products that contain nicotine or tobacco, such as cigarettes, e-cigarettes, and chewing tobacco. These can delay healing. If you need help quitting, ask your health care provider.  Keep all follow-up visits as told by your health care provider. This is important. Contact a health care provider if:  You have knee pain that is not getting better or gets worse.  You are unable to do your physical therapy exercises due to knee pain. Get help right away if:  Your knee swells and the swelling becomes worse.  You cannot move your knee.  You have severe knee pain. Summary  Knee pain that lasts more than 3 months is considered chronic knee pain.  The main treatments for chronic knee pain are physical therapy and weight loss. You may also need to take medicines, wear a knee sleeve or brace, use crutches, and apply ice or heat.  Losing even a little weight can reduce knee pain. Ask your health care provider what your ideal weight is, and how to safely lose extra weight. A food expert (dietitian) may be able to help you plan your meals.  Work with a physical therapist to make a safe exercise program, as told by your health care provider. This information is not intended to replace advice given to you by your health care provider. Make sure you discuss any questions you have with your health care provider. Document Revised: 09/03/2018 Document Reviewed: 09/03/2018 Elsevier Patient Education  [place].  Osteoarthritis  Osteoarthritis is a type of arthritis that affects tissue that covers the ends of bones in joints (cartilage). Cartilage acts as a cushion between the bones and helps them move smoothly. Osteoarthritis results when cartilage in the joints gets worn down. Osteoarthritis  is sometimes called "wear and tear" arthritis. Osteoarthritis is the most common form of arthritis. It often occurs in older people. It is a condition that gets worse over time (a progressive condition). Joints that are most often affected by this condition are in:  Fingers.  Toes.  Hips.  Knees.  Spine, including neck and lower back. What are the causes? This condition is caused by age-related wearing down of cartilage that covers the ends of bones. What increases the risk? The following factors may make you more likely to develop this condition:  Older age.  Being overweight or obese.  Overuse of joints, such as in athletes.  Past injury of a joint.  Past surgery on a joint.  Family history of osteoarthritis. What are the signs or symptoms? The main symptoms of this condition are pain, swelling, and stiffness in the joint. The joint may lose its shape over time. Small pieces of bone or cartilage may break off and float inside of the joint, which may cause more pain and damage to  the joint. Small deposits of bone (osteophytes) may grow on the edges of the joint. Other symptoms may include:  A grating or scraping feeling inside the joint when you move it.  Popping or creaking sounds when you move. Symptoms may affect one or more joints. Osteoarthritis in a major joint, such as your knee or hip, can make it painful to walk or exercise. If you have osteoarthritis in your hands, you might not be able to grip items, twist your hand, or control small movements of your hands and fingers (fine motor skills). How is this diagnosed? This condition may be diagnosed based on:  Your medical history.  A physical exam.  Your symptoms.  X-rays of the affected joint(s).  Blood tests to rule out other types of arthritis. How is this treated? There is no cure for this condition, but treatment can help to control pain and improve joint function. Treatment plans may include:  A  prescribed exercise program that allows for rest and joint relief. You may work with a physical therapist.  A weight control plan.  Pain relief techniques, such as: ? Applying heat and cold to the joint. ? Electric pulses delivered to nerve endings under the skin (transcutaneous electrical nerve stimulation, or TENS). ? Massage. ? Certain nutritional supplements.  NSAIDs or prescription medicines to help relieve pain.  Medicine to help relieve pain and inflammation (corticosteroids). This can be given by mouth (orally) or as an injection.  Assistive devices, such as a brace, wrap, splint, specialized glove, or cane.  Surgery, such as: ? An osteotomy. This is done to reposition the bones and relieve pain or to remove loose pieces of bone and cartilage. ? Joint replacement surgery. You may need this surgery if you have very bad (advanced) osteoarthritis. Follow these instructions at home: Activity  Rest your affected joints as directed by your health care provider.  Do not drive or use heavy machinery while taking prescription pain medicine.  Exercise as directed. Your health care provider or physical therapist may recommend specific types of exercise, such as: ? Strengthening exercises. These are done to strengthen the muscles that support joints that are affected by arthritis. They can be performed with weights or with exercise bands to add resistance. ? Aerobic activities. These are exercises, such as brisk walking or water aerobics, that get your heart pumping. ? Range-of-motion activities. These keep your joints easy to move. ? Balance and agility exercises. Managing pain, stiffness, and swelling      If directed, apply heat to the affected area as often as told by your health care provider. Use the heat source that your health care provider recommends, such as a moist heat pack or a heating pad. ? If you have a removable assistive device, remove it as told by your health care  provider. ? Place a towel between your skin and the heat source. If your health care provider tells you to keep the assistive device on while you apply heat, place a towel between the assistive device and the heat source. ? Leave the heat on for 20-30 minutes. ? Remove the heat if your skin turns bright red. This is especially important if you are unable to feel pain, heat, or cold. You may have a greater risk of getting burned.  If directed, put ice on the affected joint: ? If you have a removable assistive device, remove it as told by your health care provider. ? Put ice in a plastic bag. ? Place a  towel between your skin and the bag. If your health care provider tells you to keep the assistive device on during icing, place a towel between the assistive device and the bag. ? Leave the ice on for 20 minutes, 2-3 times a day. General instructions  Take over-the-counter and prescription medicines only as told by your health care provider.  Maintain a healthy weight. Follow instructions from your health care provider for weight control. These may include dietary restrictions.  Do not use any products that contain nicotine or tobacco, such as cigarettes and e-cigarettes. These can delay bone healing. If you need help quitting, ask your health care provider.  Use assistive devices as directed by your health care provider.  Keep all follow-up visits as told by your health care provider. This is important. Where to find more information  [employer] of Arthritis and Musculoskeletal and Skin Diseases: www.niams.South[employer].es  [employer] on Aging: http://kim-miller.com/  American College of Rheumatology: www.rheumatology.org Contact a health care provider if:  Your skin turns red.  You develop a rash.  You have pain that gets worse.  You have a fever along with joint or muscle aches. Get help right away if:  You lose a lot of weight.  You suddenly lose your appetite.  You have  night sweats. Summary  Osteoarthritis is a type of arthritis that affects tissue covering the ends of bones in joints (cartilage).  This condition is caused by age-related wearing down of cartilage that covers the ends of bones.  The main symptom of this condition is pain, swelling, and stiffness in the joint.  There is no cure for this condition, but treatment can help to control pain and improve joint function. This information is not intended to replace advice given to you by your health care provider. Make sure you discuss any questions you have with your health care provider. Document Revised: 06/06/2017 Document Reviewed: 02/26/2016 Elsevier Patient Education  2020 [place].  Knee Injection A knee injection is a procedure to get medicine into your knee joint to relieve the pain, swelling, and stiffness of arthritis. Your health care provider uses a needle to inject medicine, which may also help to lubricate and cushion your knee joint. You may need more than one injection. Tell a health care provider about:  Any allergies you have.  All medicines you are taking, including vitamins, herbs, eye drops, creams, and over-the-counter medicines.  Any problems you or family members have had with anesthetic medicines.  Any blood disorders you have.  Any surgeries you have had.  Any medical conditions you have.  Whether you are pregnant or may be pregnant. What are the risks? Generally, this is a safe procedure. However, problems may occur, including:  Infection.  Bleeding.  Symptoms that get worse.  Damage to the area around your knee.  Allergic reaction to any of the medicines.  Skin reactions from repeated injections. What happens before the procedure?  Ask your health care provider about changing or stopping your regular medicines. This is especially important if you are taking diabetes medicines or blood thinners.  Plan to have someone take you home from the  hospital or clinic. What happens during the procedure?   You will sit or lie down in a position for your knee to be treated.  The skin over your kneecap will be cleaned with a germ-killing soap.  You will be given a medicine that numbs the area (local anesthetic). You may feel some stinging.  The medicine  will be injected into your knee. The needle is carefully placed between your kneecap and your knee. The medicine is injected into the joint space.  The needle will be removed at the end of the procedure.  A bandage (dressing) may be placed over the injection site. The procedure may vary among health care providers and hospitals. What can I expect after the procedure?  Your blood pressure, heart rate, breathing rate, and blood oxygen level will be monitored until you leave the hospital or clinic.  You may have to move your knee through its full range of motion. This helps to get all the medicine into your joint space.  You will be watched to make sure that you do not have a reaction to the injected medicine.  You may feel more pain, swelling, and warmth than you did before the injection. This reaction may last about 1-2 days. Follow these instructions at home: Medicines  Take over-the-counter and prescription medicines only as told by your doctor.  Do not drive or use heavy machinery while taking prescription pain medicine.  Do not take medicines such as aspirin and ibuprofen unless your health care provider tells you to take them. Injection site care  Follow instructions from your health care provider about: ? How to take care of your puncture site. ? When and how you should change your dressing. ? When you should remove your dressing.  Check your injection area every day for signs of infection. Check for: ? More redness, swelling, or pain after 2 days. ? Fluid or blood. ? Pus or a bad smell. ? Warmth. Managing pain, stiffness, and swelling   If directed, put ice on  the injection area: ? Put ice in a plastic bag. ? Place a towel between your skin and the bag. ? Leave the ice on for 20 minutes, 2-3 times per day.  Do not apply heat to your knee.  Raise (elevate) the injection area above the level of your heart while you are sitting or lying down. General instructions  If you were given a dressing, keep it dry until your health care provider says it can be removed. Ask your health care provider when you can start showering or taking a bath.  Avoid strenuous activities for as long as directed by your health care provider. Ask your health care provider when you can return to your normal activities.  Keep all follow-up visits as told by your health care provider. This is important. You may need more injections. Contact a health care provider if you have:  A fever.  Warmth in your injection area.  Fluid, blood, or pus coming from your injection site.  Symptoms at your injection site that last longer than 2 days after your procedure. Get help right away if:  Your knee: ? Turns very red. ? Becomes very swollen. ? Is in severe pain. Summary  A knee injection is a procedure to get medicine into your knee joint to relieve the pain, swelling, and stiffness of arthritis.  A needle is carefully placed between your kneecap and your knee to inject medicine into the joint space.  Before the procedure, ask your health care provider about changing or stopping your regular medicines, especially if you are taking diabetes medicines or blood thinners.  Contact your health care provider if you have any problems or questions after your procedure. This information is not intended to replace advice given to you by your health care provider. Make sure you discuss any questions  you have with your health care provider. Document Revised: 07/14/2017 Document Reviewed: 07/14/2017 Elsevier Patient Education  [place].

## 2019-10-23 ENCOUNTER — Other Ambulatory Visit: Payer: Self-pay | Admitting: Orthopedic Surgery

## 2019-10-28 ENCOUNTER — Other Ambulatory Visit: Payer: Self-pay | Admitting: Orthopedic Surgery

## 2019-10-28 DIAGNOSIS — M1711 Unilateral primary osteoarthritis, right knee: Secondary | ICD-10-CM

## 2019-11-28 ENCOUNTER — Other Ambulatory Visit: Payer: Self-pay | Admitting: Orthopedic Surgery

## 2019-11-28 DIAGNOSIS — M1711 Unilateral primary osteoarthritis, right knee: Secondary | ICD-10-CM

## 2019-12-27 ENCOUNTER — Other Ambulatory Visit: Payer: Self-pay | Admitting: Orthopedic Surgery

## 2019-12-28 ENCOUNTER — Other Ambulatory Visit: Payer: Self-pay | Admitting: Orthopedic Surgery

## 2019-12-28 DIAGNOSIS — M1711 Unilateral primary osteoarthritis, right knee: Secondary | ICD-10-CM

## 2020-02-01 ENCOUNTER — Other Ambulatory Visit: Payer: Self-pay | Admitting: Orthopedic Surgery

## 2020-02-01 DIAGNOSIS — M1711 Unilateral primary osteoarthritis, right knee: Secondary | ICD-10-CM

## 2020-03-03 ENCOUNTER — Other Ambulatory Visit: Payer: Self-pay | Admitting: Orthopedic Surgery

## 2020-03-03 DIAGNOSIS — M1711 Unilateral primary osteoarthritis, right knee: Secondary | ICD-10-CM

## 2020-03-23 ENCOUNTER — Other Ambulatory Visit: Payer: Self-pay | Admitting: Orthopedic Surgery

## 2020-04-02 ENCOUNTER — Other Ambulatory Visit: Payer: Self-pay | Admitting: Orthopedic Surgery

## 2020-04-02 DIAGNOSIS — M1711 Unilateral primary osteoarthritis, right knee: Secondary | ICD-10-CM

## 2020-05-05 ENCOUNTER — Other Ambulatory Visit: Payer: Self-pay | Admitting: Orthopedic Surgery

## 2020-05-05 DIAGNOSIS — M1711 Unilateral primary osteoarthritis, right knee: Secondary | ICD-10-CM

## 2020-05-15 ENCOUNTER — Encounter: Payer: Self-pay | Admitting: *Deleted

## 2020-05-15 NOTE — Progress Notes (Signed)
Source: [facility] @ [place].

## 2020-05-16 ENCOUNTER — Telehealth: Payer: Self-pay | Admitting: *Deleted

## 2020-05-16 ENCOUNTER — Ambulatory Visit: Payer: Medicaid Other | Admitting: Neurology

## 2020-05-16 ENCOUNTER — Encounter: Payer: Self-pay | Admitting: Neurology

## 2020-05-16 VITALS — BP 140/87 | HR 80 | Ht 69.0 in | Wt 232.0 lb

## 2020-05-16 DIAGNOSIS — I639 Cerebral infarction, unspecified: Secondary | ICD-10-CM

## 2020-05-16 DIAGNOSIS — R200 Anesthesia of skin: Secondary | ICD-10-CM

## 2020-05-16 MED ORDER — ASPIRIN EC 81 MG PO TBEC: 81.0000 mg | DELAYED_RELEASE_TABLET | Freq: Every day | ORAL | 11 refills | Status: AC

## 2020-05-16 NOTE — Patient Instructions (Addendum)
CT Scan of the blood vessels of the head and neck - will call Echocardiogram and then possibly Trans Cranial Doppler looking for Patent Foramen Ovale or other risk factors for stroke Cholesterol blood work today  There is increased risk for stroke in women with migraine with aura and a contraindication for the combined contraceptive pill for use by women who have migraine with aura. The risk for women with migraine without aura is lower. However other risk factors like smoking are far more likely to increase stroke risk than migraine. There is a recommendation for no smoking and for the use of OCPs without estrogen such as progestogen only pills particularly for women with migraine with aura.Marland Kitchen People who have migraine headaches with auras may be 3 times more likely to have a stroke caused by a blood clot, compared to migraine patients who don't see auras. Women who take hormone-replacement therapy may be 30 percent more likely to suffer a clot-based stroke than women not taking medication containing estrogen. Other risk factors like smoking and high blood pressure may be  much more important   Ischemic Stroke  An ischemic stroke (cerebrovascular accident, or CVA) is the sudden death of brain tissue that occurs when an area of the brain does not get enough oxygen. It is a medical emergency that must be treated right away. An ischemic stroke can cause permanent loss of brain function. This can cause problems with how different parts of your body function. What are the causes? This condition is caused by a decrease of oxygen supply to an area of the brain, which may be the result of:  A small blood clot (embolus) or a buildup of plaque in the blood vessels (atherosclerosis) that blocks blood flow in the brain.  An abnormal heart rhythm (atrial fibrillation).  A blocked or damaged artery in the head or neck. Sometimes the cause of stroke is not known (cryptogenic). What increases the risk? Certain  factors may make you more likely to develop this condition. Some of these factors are things that you can change, such as:  Obesity.  Smoking cigarettes.  Taking oral birth control, especially if you also use tobacco.  Physical inactivity.  Excessive alcohol use.  Use of illegal drugs, especially cocaine and methamphetamine. Other risk factors include:  High blood pressure (hypertension).  High cholesterol.  Diabetes mellitus.  Heart disease.  Being [place] American, Native American, Hispanic, or [place] Native.  Being over age 35.  Family history of stroke.  Previous history of blood clots, stroke, or transient ischemic attack (TIA).  Sickle cell disease.  Being a woman with a history of preeclampsia.  Migraine headache.  Sleep apnea.  Irregular heartbeats, such as atrial fibrillation.  Chronic inflammatory diseases, such as rheumatoid arthritis or lupus.  Blood clotting disorders (hypercoagulable state). What are the signs or symptoms? Symptoms of this condition usually develop suddenly, or you may notice them after waking up from sleep. Symptoms may include sudden:  Weakness or numbness in your face, arm, or leg, especially on one side of your body.  Trouble walking or difficulty moving your arms or legs.  Loss of balance or coordination.  Confusion.  Slurred speech (dysarthria).  Trouble speaking, understanding speech, or both (aphasia).  Vision changes--such as double vision, blurred vision, or loss of vision--in one or both eyes.  Dizziness.  Nausea and vomiting.  Severe headache with no known cause. The headache is often described as the worst headache ever experienced. If possible, make note of the  exact time that you last felt like your normal self and what time your symptoms started. Tell your health care provider. If symptoms come and go, this could be a sign of a warning stroke, or TIA. Get help right away, even if you feel better. How is  this diagnosed? This condition may be diagnosed based on:  Your symptoms, your medical history, and a physical exam.  CT scan of the brain.  MRI.  CT angiogram. This test uses a computer to take X-rays of your arteries. A dye may be injected into your blood to show the inside of your blood vessels more clearly.  MRI angiogram. This is a type of MRI that is used to evaluate the blood vessels.  Cerebral angiogram. This test uses X-rays and a dye to show the blood vessels in the brain and neck. You may need to see a health care provider who specializes in stroke care. A stroke specialist can be seen in person or through communication using telephone or television technology (telemedicine). Other tests may also be done to find the cause of the stroke, such as:  Electrocardiogram (ECG).  Continuous heart monitoring.  Echocardiogram.  Transesophageal echocardiogram (TEE).  Carotid ultrasound.  A scan of the brain circulation.  Blood tests.  Sleep study to check for sleep apnea. How is this treated? Treatment for this condition will depend on the duration, severity, and cause of your symptoms and on the area of the brain affected. It is very important to get treatment at the first sign of stroke symptoms. Some treatments work better if they are done within 3-6 hours of the onset of stroke symptoms. These initial treatments may include:  Aspirin.  Medicines to control blood pressure.  Medicine given by injection to dissolve the blood clot (thrombolytic).  Treatments given directly to the affected artery to remove or dissolve the blood clot. Other treatment options may include:  Oxygen.  IV fluids.  Medicines to thin the blood (anticoagulants or antiplatelets).  Procedures to increase blood flow. Medicines and changes to your diet may be used to help treat and manage risk factors for stroke, such as diabetes, high cholesterol, and high blood pressure. After a stroke, you  may work with physical, speech, mental health, or occupational therapists to help you recover. Follow these instructions at home: Medicines  Take over-the-counter and prescription medicines only as told by your health care provider.  If you were told to take a medicine to thin your blood, such as aspirin or an anticoagulant, take it exactly as told by your health care provider. ? Taking too much blood-thinning medicine can cause bleeding. ? If you do not take enough blood-thinning medicine, you will not have the protection that you need against another stroke and other problems.  Understand the side effects of taking anticoagulant medicine. When taking this type of medicine, make sure you: ? Hold pressure over any cuts for longer than usual. ? Tell your dentist and other health care providers that you are taking anticoagulants before you have any procedures that may cause bleeding. ? Avoid activities that may cause trauma or injury. Eating and drinking  Follow instructions from your health care provider about diet.  Eat healthy foods.  If your ability to swallow was affected by the stroke, you may need to take steps to avoid choking, such as: ? Taking small bites when eating. ? Eating foods that are soft or pureed. Safety  Follow instructions from your health care team about physical activity.  Use a walker or cane as told by your health care provider.  Take steps to create a safe home environment in order to reduce the risk of falls. This may include: ? Having your home looked at by specialists. ? Installing grab bars in the bedroom and bathroom. ? Using safety equipment, such as raised toilets and a seat in the shower. General instructions  Do not use any tobacco products, such as cigarettes, chewing tobacco, and e-cigarettes. If you need help quitting, ask your health care provider.  Limit alcohol intake to no more than 1 drink a day for nonpregnant women and 2 drinks a day  for men. One drink equals 12 oz of beer, 5 oz of wine, or 1 oz of hard liquor.  If you need help to stop using drugs or alcohol, ask your health care provider about a referral to a program or specialist.  Maintain an active and healthy lifestyle. Get regular exercise as told by your health care provider.  Keep all follow-up visits as told by your health care provider, including visits with all specialists on your health care team. This is important. How is this prevented? Your risk of another stroke can be decreased by managing high blood pressure, high cholesterol, diabetes, heart disease, sleep apnea, and obesity. It can also be decreased by quitting smoking, limiting alcohol, and staying physically active. Your health care provider will continue to work with you on measures to prevent short-term and long-term complications of stroke. Get help right away if:   You have any symptoms of a stroke. "BE FAST" is an easy way to remember the main warning signs of a stroke: ? B - Balance. Signs are dizziness, sudden trouble walking, or loss of balance. ? E - Eyes. Signs are trouble seeing or a sudden change in vision. ? F - Face. Signs are sudden weakness or numbness of the face, or the face or eyelid drooping on one side. ? A - Arms. Signs are weakness or numbness in an arm. This happens suddenly and usually on one side of the body. ? S - Speech. Signs are sudden trouble speaking, slurred speech, or trouble understanding what people say. ? T - Time. Time to call emergency services. Write down what time symptoms started.  You have other signs of a stroke, such as: ? A sudden, severe headache with no known cause. ? Nausea or vomiting. ? Seizure.  These symptoms may represent a serious problem that is an emergency. Do not wait to see if the symptoms will go away. Get medical help right away. Call your local emergency services (911 in the [place] Do not drive yourself to the hospital. Summary  An  ischemic stroke (cerebrovascular accident, or CVA) is the sudden death of brain tissue that occurs when an area of the brain does not get enough oxygen.  Symptoms of this condition usually develop suddenly, or you may notice them after waking up from sleep.  It is very important to get treatment at the first sign of stroke symptoms. Stroke is a medical emergency that must be treated right away. This information is not intended to replace advice given to you by your health care provider. Make sure you discuss any questions you have with your health care provider. Document Revised: 03/13/2018 Document Reviewed: 09/20/2015 Elsevier Patient Education  [place].   Echocardiogram An echocardiogram is a procedure that uses painless sound waves (ultrasound) to produce an image of the heart. Images from an echocardiogram can  provide important information about:  Signs of coronary artery disease (CAD).  Aneurysm detection. An aneurysm is a weak or damaged part of an artery wall that bulges out from the normal force of blood pumping through the body.  Heart size and shape. Changes in the size or shape of the heart can be associated with certain conditions, including heart failure, aneurysm, and CAD.  Heart muscle function.  Heart valve function.  Signs of a past heart attack.  Fluid buildup around the heart.  Thickening of the heart muscle.  A tumor or infectious growth around the heart valves. Tell a health care provider about:  Any allergies you have.  All medicines you are taking, including vitamins, herbs, eye drops, creams, and over-the-counter medicines.  Any blood disorders you have.  Any surgeries you have had.  Any medical conditions you have.  Whether you are pregnant or may be pregnant. What are the risks? Generally, this is a safe procedure. However, problems may occur, including:  Allergic reaction to dye (contrast) that may be used during the  procedure. What happens before the procedure? No specific preparation is needed. You may eat and drink normally. What happens during the procedure?   An IV tube may be inserted into one of your veins.  You may receive contrast through this tube. A contrast is an injection that improves the quality of the pictures from your heart.  A gel will be applied to your chest.  A wand-like tool (transducer) will be moved over your chest. The gel will help to transmit the sound waves from the transducer.  The sound waves will harmlessly bounce off of your heart to allow the heart images to be captured in real-time motion. The images will be recorded on a computer. The procedure may vary among health care providers and hospitals. What happens after the procedure?  You may return to your normal, everyday life, including diet, activities, and medicines, unless your health care provider tells you not to do that. Summary  An echocardiogram is a procedure that uses painless sound waves (ultrasound) to produce an image of the heart.  Images from an echocardiogram can provide important information about the size and shape of your heart, heart muscle function, heart valve function, and fluid buildup around your heart.  You do not need to do anything to prepare before this procedure. You may eat and drink normally.  After the echocardiogram is completed, you may return to your normal, everyday life, unless your health care provider tells you not to do that. This information is not intended to replace advice given to you by your health care provider. Make sure you discuss any questions you have with your health care provider. Document Revised: 10/15/2018 Document Reviewed: 07/27/2016 Elsevier Patient Education  2020 [place], Adult   A foramen ovale is a hole between the upper chambers (right atrium and left atrium) of the heart. Before you are born, it is normal to have this  hole in your heart. The hole allows blood to circulate through the body without having to go through the lungs. After your birth, when you are able to breathe, you do not need the foramen ovale and it usually closes. If the hole does not close, it is called a patent foramen ovale (PFO). PFO is a common condition. Most people do not know they have this hole and they do not have any health problems caused by it. What are the causes? The cause of  this condition is not known. What increases the risk? You are more likely to develop this condition if:  You have a family history of PFO.  You also have other heart disease that is present at birth (congenital heart disease). What are the signs or symptoms? In most cases, there are no symptoms of this condition. Possible rare symptoms include:  Stroke caused by a blood clot.  Transient ischemic attack (TIA). This is a "warning stroke" that causes stroke-like symptoms that go away quickly.  Migraine headaches. How is this diagnosed? This condition may be diagnosed based on:  A physical exam and your medical history.  Echocardiogram. This test uses sound waves to produce images of the heart.  Transesophageal echocardiogram (TEE). This type of echocardiogram is performed by placing a probe in the part of the body that moves food from the mouth to the stomach (esophagus).  Electrocardiogram (ECG). This test identifies changes in the electrical activity of the heart.  Cardiac MRI. This is an imaging technique that is used to visualize the heart, if further images are needed after TEE. How is this treated? Usually, no treatment is needed. If your condition is associated with symptoms or blood clots, you may need:  Medicines to prevent blood clots and strokes (anticoagulant or antiplatelet medicines).  A surgical procedure to close the hole (transcatheter closure). Follow these instructions at home:  Take over-the-counter and prescription  medicines only as told by your health care provider.  Keep all follow-up visits as told by your health care provider. This is important. Contact a health care provider if:  You have a fever.  You have frequent or severe headaches. Get help right away if:  Your skin turns blue.  You have chest pain or difficulty breathing.  You have any symptoms of stroke. "BE FAST" is an easy way to remember the main warning signs of stroke: ? B - Balance. Signs are dizziness, sudden trouble walking, or loss of balance. ? E - Eyes. Signs are trouble seeing or a sudden change in vision. ? F - Face. Signs are sudden weakness or numbness of the face, or the face or eyelid drooping on one side. ? A - Arms. Signs are weakness or numbness in an arm. This happens suddenly and usually on one side of the body. ? S - Speech. Signs are sudden trouble speaking, slurred speech, or trouble understanding what people say. ? T - Time. Time to call emergency services. Write down what time symptoms started.  You have other signs of stroke, which may include: ? A sudden, severe headache with no known cause. ? Nausea or vomiting. ? Seizure. These symptoms may represent a serious problem that is an emergency. Do not wait to see if the symptoms will go away. Get medical help right away. Call your local emergency services (911 in the [place] Do not drive yourself to the hospital. Summary  A patent foramen ovale is a hole between the upper chambers (right atrium and left atrium) of your heart. The cause of this condition is not known.  You may not know that you have a hole in your heart, and you may not have any health problems associated with it.  Usually, no treatment is needed for this condition unless it is associated with symptoms or blood clots. This information is not intended to replace advice given to you by your health care provider. Make sure you discuss any questions you have with your health care  provider. Document Revised: 08/29/2017  Document Reviewed: 09/03/2016 Elsevier Patient Education  [place].   Stroke Prevention Some medical conditions and lifestyle choices can lead to a higher risk for a stroke. You can help to prevent a stroke by making nutrition, lifestyle, and other changes. What nutrition changes can be made?   Eat healthy foods. ? Choose foods that are high in fiber. These include:  Fresh fruits.  Fresh vegetables.  Whole grains. ? Eat at least 5 or more servings of fruits and vegetables each day. Try to fill half of your plate at each meal with fruits and vegetables. ? Choose lean protein foods. These include:  Lowfat (lean) cuts of meat.  Chicken without skin.  Fish.  Tofu.  Beans.  Nuts. ? Eat low-fat dairy products. ? Avoid foods that:  Are high in salt (sodium).  Have saturated fat.  Have trans fat.  Have cholesterol.  Are processed.  Are premade.  Follow eating guidelines as told by your doctor. These may include: ? Reducing how many calories you eat and drink each day. ? Limiting how much salt you eat or drink each day to 1,500 milligrams (mg). ? Using only healthy fats for cooking. These include:  Olive oil.  Canola oil.  Sunflower oil. ? Counting how many carbohydrates you eat and drink each day. What lifestyle changes can be made?  Try to stay at a healthy weight. Talk to your doctor about what a good weight is for you.  Get at least 30 minutes of moderate physical activity at least 5 days a week. This can include: ? Fast walking. ? Biking. ? Swimming.  Do not use any products that have nicotine or tobacco. This includes cigarettes and e-cigarettes. If you need help quitting, ask your doctor. Avoid being around tobacco smoke in general.  Limit how much alcohol you drink to no more than 1 drink a day for nonpregnant women and 2 drinks a day for men. One drink equals 12 oz of beer, 5 oz of wine, or 1 oz of  hard liquor.  Do not use drugs.  Avoid taking birth control pills. Talk to your doctor about the risks of taking birth control pills if: ? You are over 21 years old. ? You smoke. ? You get migraines. ? You have had a blood clot. What other changes can be made?  Manage your cholesterol. ? It is important to eat a healthy diet. ? If your cholesterol cannot be managed through your diet, you may also need to take medicines. Take medicines as told by your doctor.  Manage your diabetes. ? It is important to eat a healthy diet and to exercise regularly. ? If your blood sugar cannot be managed through diet and exercise, you may need to take medicines. Take medicines as told by your doctor.  Control your high blood pressure (hypertension). ? Try to keep your blood pressure below 130/80. This can help lower your risk of stroke. ? It is important to eat a healthy diet and to exercise regularly. ? If your blood pressure cannot be managed through diet and exercise, you may need to take medicines. Take medicines as told by your doctor. ? Ask your doctor if you should check your blood pressure at home. ? Have your blood pressure checked every year. Do this even if your blood pressure is normal.  Talk to your doctor about getting checked for a sleep disorder. Signs of this can include: ? Snoring a lot. ? Feeling very tired.  Take  over-the-counter and prescription medicines only as told by your doctor. These may include aspirin or blood thinners (antiplatelets or anticoagulants).  Make sure that any other medical conditions you have are managed. Where to find more information  American Stroke Association: www.strokeassociation.org  National Stroke Association: www.stroke.org Get help right away if:  You have any symptoms of stroke. "BE FAST" is an easy way to remember the main warning signs: ? B - Balance. Signs are dizziness, sudden trouble walking, or loss of balance. ? E - Eyes. Signs are  trouble seeing or a sudden change in how you see. ? F - Face. Signs are sudden weakness or loss of feeling of the face, or the face or eyelid drooping on one side. ? A - Arms. Signs are weakness or loss of feeling in an arm. This happens suddenly and usually on one side of the body. ? S - Speech. Signs are sudden trouble speaking, slurred speech, or trouble understanding what people say. ? T - Time. Time to call emergency services. Write down what time symptoms started.  You have other signs of stroke, such as: ? A sudden, very bad headache with no known cause. ? Feeling sick to your stomach (nausea). ? Throwing up (vomiting). ? Jerky movements you cannot control (seizure). These symptoms may represent a serious problem that is an emergency. Do not wait to see if the symptoms will go away. Get medical help right away. Call your local emergency services (911 in the [place] Do not drive yourself to the hospital. Summary  You can prevent a stroke by eating healthy, exercising, not smoking, drinking less alcohol, and treating other health problems, such as diabetes, high blood pressure, or high cholesterol.  Do not use any products that contain nicotine or tobacco, such as cigarettes and e-cigarettes.  Get help right away if you have any signs or symptoms of a stroke. This information is not intended to replace advice given to you by your health care provider. Make sure you discuss any questions you have with your health care provider. Document Revised: 08/20/2018 Document Reviewed: 09/25/2016 Elsevier Patient Education  [place].

## 2020-05-16 NOTE — Progress Notes (Signed)
[facility]    Provider:  Dr Jaynee Eagles Requesting Provider: Leeanne Rio, MD Primary Care Provider:  Leeanne Rio, MD  CC:  Numbness  HPI:  Jaime Massey is a 44 y.o. female here as requested by Leeanne Rio, MD for "Numbness and tingling in arms and legs needs NCV".  Past medical history migraine with aura, anxiety, obesity, asthma, chronic left knee pain, depression, daily persistent headache onset August 17, 2016 and fever of unknown origin, history of pulmonary embolus in 2011 6 weeks status post C-section, tobacco abuse, prior warfarin use.  I reviewed Dr. Coolidge Breeze examination which showed normal general, HEENT, neck, heart, lungs, abdomen, extremities, skin, neuro and psych examinations.  I reviewed Dr. Roland Rack notes: Patient reports tingling on the right side, a few weeks prior she had hip pain and popping, reports hip pain during appointment, has numbness in her right leg and right arm, she is also having headaches which are chronic, blood pressure was good today, has not had her eyes checked this year, she reports being at home since June 2020, feels like she is always stressed, more so with her grown children who are 24, 22 and 17, she does report a lot of stress.  Patient is here alone and she reports numbness, in August 2021 she said her right side went numb at the end of vacation, she saw her doctor and she started gabapentin, does help some but she still has the numbness and feels it is in her hands and foot, it is not the sharp tingling as it was before, she also has chronic headaches.In August they went on vacation, no significant exercise, she took a shower one evening and her right foot was swollen like a balloon, she went to bed and whole right side was numb, face, arm and leg, she drove home and thought she was going to have to pull over. She was dizzy, no known facial droop, no vision changes or talking problems, it never resolved, Gabapentin may  help with the symptoms its a sharp tingling but her hand and right foot are still numb. Acute onset. When her right foot was swollen the lower leg sore. Symptoms are in the entire hand on the right, right foot feels different. The right side of the face was definitely involved initially. 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   Reviewed notes, labs and imaging from outside physicians, which showed:  MRI of the brain wo contrast: reviewed report no acute intracranial process  04/12/2020: CBC and CMP  unremarkable, BUN 9, Creat 0.69, hgba1c 5.2 02/23/2020: Hgba1c 5.2, TSH 0.831  Review of Systems: Patient complains of symptoms per HPI as well as the following symptoms: Blurred vision, feeling hot, feeling cold, increased thirst, snoring, swelling in legs, fatigue, ringing in ears, constipation, joint pain, joint swelling, cramps, aching muscles, headache, numbness, weakness, dizziness, sleepiness, restless legs, depression, anxiety, too much sleep, not in of sleep, disinterest in activity and decreased energy. Pertinent negatives and positives per HPI. All others negative.   Social History   Socioeconomic History  . Marital status: Divorced    Spouse name: Not on file  . Number of children: 4  . Years of education: Not on file  . Highest education level: Some college, no degree  Occupational History  . Not on file  Tobacco Use  . Smoking status: Former Smoker    Packs/day: 0.50  Years: 26.00    Pack years: 13.00    Types: Cigarettes    Quit date: 10/2017    Years since quitting: 2.6  . Smokeless tobacco: Never Used  Vaping Use  . Vaping Use: Never used  Substance and Sexual Activity  . Alcohol use: Yes    Comment: reports occasional beer but very seldom  . Drug use: No  . Sexual activity: Yes    Birth control/protection: I.U.D.  Other Topics Concern  . Not on file  Social History Narrative   Lives at home with  son and boyfriend   Right handed   Caffeine: soda daily   Social Determinants of Health   Financial Resource Strain:   . Difficulty of Paying Living Expenses: Not on file  Food Insecurity:   . Worried About Charity fundraiser in the Last Year: Not on file  . Ran Out of Food in the Last Year: Not on file  Transportation Needs:   . Lack of Transportation (Medical): Not on file  . Lack of Transportation (Non-Medical): Not on file  Physical Activity:   . Days of Exercise per Week: Not on file  . Minutes of Exercise per Session: Not on file  Stress:   . Feeling of Stress : Not on file  Social Connections:   . Frequency of Communication with Friends and Family: Not on file  . Frequency of Social Gatherings with Friends and Family: Not on file  . Attends Religious Services: Not on file  . Active Member of Clubs or Organizations: Not on file  . Attends Archivist Meetings: Not on file  . Marital Status: Not on file  Intimate Partner Violence:   . Fear of Current or Ex-Partner: Not on file  . Emotionally Abused: Not on file  . Physically Abused: Not on file  . Sexually Abused: Not on file    Family History  Problem Relation Age of Onset  . Breast cancer Maternal Grandmother   . Skin cancer Maternal Grandmother   . Cancer Maternal Grandfather   . Diabetes Paternal Grandmother   . Heart disease Paternal Grandfather   . Breast cancer Mother   . Aortic aneurysm Father   . Stroke Neg Hx        none aware of   . Neuropathy Neg Hx        none aware of     Past Medical History:  Diagnosis Date  . Anxiety   . Arthritis   . Asthma due to environmental allergies    pt states she has never had asthma problems   . Depression   . DVT (deep venous thrombosis) ([place])    post c-section  . Migraine   . PE (pulmonary embolism)    post c-section  . Seizures ([place])    as child from severe ear infections- no meds now and no seizures since age 20  . Warfarin anticoagulation     took from 03/2010-12/2010.     Patient Active Problem List   Diagnosis Date Noted  . S/P knee surgery patella chondroplasty 03/05/17 02/12/2018  . Obesity 12/19/2017  . Chondromalacia of lateral femoral condyle, right   . 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 Chondromalacia patellae of right knee   . Rupture of anterior cruciate ligament of right knee   . Cyst of anterior horn of lateral meniscus of right knee   . Adjustment disorder with anxious mood 02/15/2017  . Bandemia without diagnosis of  specific infection 02/15/2017  . Pulmonary embolus ([place]) 02/15/2017  . FUO (fever of unknown origin) 02/15/2017  . Arthritis of knee, right 12/26/2016  . Bone spur 12/03/2016  . Fever of unknown origin 10/01/2016    Past Surgical History:  Procedure Laterality Date  . CESAREAN SECTION    . CHOLECYSTECTOMY N/A 12/21/2012   Procedure: LAPAROSCOPIC CHOLECYSTECTOMY;  Surgeon: Donato Heinz, MD;  Location: [facility];  Service: General;  Laterality: N/A;  . KNEE ARTHROSCOPY WITH MEDIAL MENISECTOMY Right 03/05/2017   Procedure: chondroplasty patella, medial femoral condyle;  Surgeon: Carole Civil, MD;  Location: [facility];  Service: Orthopedics;  Laterality: Right;    Current Outpatient Medications  Medication Sig Dispense Refill  . acetaminophen (TYLENOL) 500 MG tablet Take 1,500 mg by mouth in the morning and at bedtime.     Marland Kitchen buPROPion (WELLBUTRIN XL) 150 MG 24 hr tablet Take 150 mg by mouth daily.    Marland Kitchen gabapentin (NEURONTIN) 100 MG capsule Take 300 mg by mouth at bedtime.    Marland Kitchen ibuprofen (ADVIL,MOTRIN) 800 MG tablet Take 1 tablet (800 mg total) by mouth every 8 (eight) hours as needed. 90 tablet 1  . levonorgestrel (MIRENA, 52 MG,) 20 MCG/24HR IUD 1 each by Intrauterine route once.    . meloxicam (MOBIC) 7.5 MG tablet Take 1 tablet by mouth once daily 30 tablet 0  . omeprazole (PRILOSEC) 20 MG capsule Take 20 mg by mouth daily.    . predniSONE (DELTASONE) 10 MG tablet Take 10 mg  by mouth daily with breakfast. Takes only as needed.    . traZODone (DESYREL) 50 MG tablet Take 50 mg by mouth at bedtime.    Marland Kitchen aspirin EC 81 MG tablet Take 1 tablet (81 mg total) by mouth daily. Swallow whole. 30 tablet 11   No current facility-administered medications for this visit.    Allergies as of 05/16/2020 - Review Complete 05/16/2020  Allergen Reaction Noted  . Amoxicillin Hives 02/03/2017  . Penicillins Rash 06/25/2014    Vitals: BP 140/87 (BP Location: Right Arm, Patient Position: Sitting)   Pulse 80   Ht 5' 9"  (1.753 m)   Wt 232 lb (105.2 kg)   BMI 34.26 kg/m  Last Weight:  Wt Readings from Last 1 Encounters:  05/16/20 232 lb (105.2 kg)   Last Height:   Ht Readings from Last 1 Encounters:  05/16/20 5' 9"  (1.753 m)     Physical exam: Exam: Gen: NAD, conversant, well nourised, obese, well groomed                     CV: RRR, no MRG. No Carotid Bruits. No peripheral edema, warm, nontender Eyes: Conjunctivae clear without exudates or hemorrhage  Neuro: Detailed Neurologic Exam  Speech:    Speech is normal; fluent and spontaneous with normal comprehension.  Cognition:    The patient is oriented to person, place, and time;     recent and remote memory intact;     language fluent;     normal attention, concentration,     fund of knowledge Cranial Nerves:    The pupils are equal, round, and reactive to light. The fundi are normal and spontaneous venous pulsations are present. Visual fields are full to finger confrontation. Extraocular movements are intact. Trigeminal sensation is intact and the muscles of mastication are normal. The face is symmetric. The palate elevates in the midline. Hearing intact. Voice is normal. Shoulder shrug is normal. The tongue has normal motion without  fasciculations.   Coordination:    No dysmetria or ataxia  Gait:    Heel-toe and tandem gait are normal.   Motor Observation:    No asymmetry, no atrophy, and no involuntary  movements noted. Tone:    Normal muscle tone.    Posture:    Posture is normal. normal erect    Strength: right leg 4/5 (patient reports this is chronic). Strength is V/V in the upper and lower limbs.      Sensation: intact to LT     Reflex Exam:  DTR's:    Deep tendon reflexes in the upper and lower extremities are 2+ bilaterally.   Toes:    The toes are downgoing bilaterally.   Clonus:    Clonus is absent.    Assessment/Plan:   44 y.o. female here as requested by Leeanne Rio, MD for "Numbness and tingling in arms and legs needs NCV".  Past medical history migraine with aura, anxiety, obesity, asthma, chronic left knee pain, depression, daily persistent headache onset August 17, 2016 and fever of unknown origin, history of pulmonary embolus in 2011 6 weeks status post C-section, tobacco abuse, prior warfarin use.  Patient had acute onset right face, right arm and right leg numbness. Still with numbness in the hand and foot. Women with migraine with aura have increased risk of stroke, needs a thorough evaluation.Mri of the brain was reportedly negative(requesting CD) but it is possible she had a small stroke not seen on MRI  - CT Scan of the blood vessels of the head and neck - will call - Echocardiogram and then possibly Trans Cranial Doppler looking for Patent Foramen Ovale or other risk factors for stroke - Cholesterol blood work today - ASA 74m daily watch for bleeding if you take with Ibuprofen-type medications like meloxicam or ibuprofen try not to take - often-discussed. - Will get MRI disk and review - If all is negative we can do an emg/ncs as requested by pcp but don't know of any peripheral nerve disorders that become acutely worse on the entire right side. She has CTS but she had face and leg involvement acutely - Not in the setting of migraine and persistent, unlikely "complcaited migraine" - May also consider hypercoagulable testing. She says the right foot  swelling did not involve the calf, no redness or pain, but given hx may have had a lower extremity DVT?  Orders Placed This Encounter  Procedures  . CT ANGIO HEAD W OR WO CONTRAST  . CT ANGIO NECK W OR WO CONTRAST  . Lipid panel  . ECHOCARDIOGRAM COMPLETE BUBBLE STUDY   Meds ordered this encounter  Medications  . aspirin EC 81 MG tablet    Sig: Take 1 tablet (81 mg total) by mouth daily. Swallow whole.    Dispense:  30 tablet    Refill:  11    Cc: Rucker, ANicole Kindred MD,  Rucker, ANicole Kindred MD  ASarina Ill MD  [facility] [address] [address] Phone [phone] [phone]

## 2020-05-16 NOTE — Telephone Encounter (Signed)
Request faxed to [facility] for patient MRI Cd. [phone]

## 2020-05-17 ENCOUNTER — Telehealth: Payer: Self-pay | Admitting: Neurology

## 2020-05-17 LAB — LIPID PANEL
Chol/HDL Ratio: 4 ratio (ref 0.0–4.4)
Cholesterol, Total: 153 mg/dL (ref 100–199)
HDL: 38 mg/dL — ABNORMAL LOW (ref >39)
LDL Chol Calc (NIH): 94 mg/dL (ref 0–99)
Triglycerides: 112 mg/dL (ref 0–149)
VLDL Cholesterol Cal: 21 mg/dL (ref 5–40)

## 2020-05-17 NOTE — Telephone Encounter (Signed)
I got an approval Reference number over the phone then I got a conformation when I came to work this morning to fax notes in . I have faxed notes to [facility] Telephone [phone] - Fax [phone]- Ref# [id number]   Patient's apt 06/15/2020.

## 2020-05-17 NOTE — Telephone Encounter (Signed)
mcd [employer] pending for [facility] because they are who is in network with the patient insurance.

## 2020-05-22 ENCOUNTER — Other Ambulatory Visit: Payer: Self-pay | Admitting: Neurology

## 2020-05-22 NOTE — Telephone Encounter (Signed)
That is fine,continue with the CTA of the head and tell patient I have to order a carotid ultrasound for her neck thanks

## 2020-05-22 NOTE — Addendum Note (Signed)
Addended by: Sarina Ill B on: 05/22/2020 01:03 PM   Modules accepted: Orders

## 2020-05-22 NOTE — Telephone Encounter (Signed)
Medicaid [employer] approved the the CTA Head but not the CTA Neck.  "reason notes with sound wave pictures of your neck blood vessels (carotid doppler). The sound wave picture should show why another test is needed (70% or more stenosis if asymptomatic or 50% or more if symptomatic)   If you would like to do a peer to peer the phone number is [phone] and the tracking number is [id number]. And they informed me 10 business.

## 2020-05-23 ENCOUNTER — Telehealth: Payer: Self-pay | Admitting: *Deleted

## 2020-05-23 NOTE — Telephone Encounter (Signed)
SCHEDULED FOR BOTH HER US CAROTID AND CT HEAD 11/18 1015AM

## 2020-05-23 NOTE — Telephone Encounter (Signed)
Rc cd from [facility]. CD on [place] desk

## 2020-05-23 NOTE — Telephone Encounter (Signed)
Noted, Jayme Cloud spoke with the patient and informed her the CTA Neck was denied and that an carotid ultrasound was order. The patient is aware the orders were sent to [facility] and they will reach out to the patient to schedule.

## 2020-05-25 NOTE — Telephone Encounter (Signed)
Faxed notes for review x3  [facility] Nov. 9 th and on 05/24/2020 and 05/25/2020 fax [phone] . I have called patient with updated details as well she is aware.

## 2020-05-26 NOTE — Telephone Encounter (Signed)
Eco is still pending

## 2020-05-31 ENCOUNTER — Telehealth: Payer: Self-pay | Admitting: Neurology

## 2020-05-31 ENCOUNTER — Encounter: Payer: Self-pay | Admitting: *Deleted

## 2020-05-31 NOTE — Telephone Encounter (Signed)
Both studies, the ultrasound carotid Dopplers and the CTA of the head, were normal.  Have a wonderful Thanksgiving.

## 2020-05-31 NOTE — Telephone Encounter (Signed)
Noted. I sent pt a mychart message.

## 2020-06-08 ENCOUNTER — Other Ambulatory Visit: Payer: Self-pay | Admitting: Orthopedic Surgery

## 2020-06-08 DIAGNOSIS — M1711 Unilateral primary osteoarthritis, right knee: Secondary | ICD-10-CM

## 2020-06-12 ENCOUNTER — Other Ambulatory Visit: Payer: Self-pay | Admitting: Neurology

## 2020-06-12 ENCOUNTER — Other Ambulatory Visit: Payer: Self-pay

## 2020-06-12 ENCOUNTER — Ambulatory Visit (HOSPITAL_COMMUNITY): Payer: Medicaid Other | Attending: Cardiology

## 2020-06-12 DIAGNOSIS — R200 Anesthesia of skin: Secondary | ICD-10-CM

## 2020-06-12 DIAGNOSIS — I639 Cerebral infarction, unspecified: Secondary | ICD-10-CM | POA: Diagnosis not present

## 2020-06-12 LAB — ECHOCARDIOGRAM COMPLETE BUBBLE STUDY
Area-P 1/2: 3.31 cm2
S' Lateral: 3.7 cm

## 2020-06-12 MED ORDER — SODIUM CHLORIDE 0.9% FLUSH: 16.0000 mL | INTRAVENOUS | Status: AC | PRN

## 2020-06-13 NOTE — Telephone Encounter (Signed)
Patient has been approved for Echo 05/16/2020 to 01/08/222 auth # [id number]

## 2020-07-07 ENCOUNTER — Other Ambulatory Visit: Payer: Self-pay | Admitting: Orthopedic Surgery

## 2020-07-07 DIAGNOSIS — M1711 Unilateral primary osteoarthritis, right knee: Secondary | ICD-10-CM

## 2020-07-13 ENCOUNTER — Encounter: Payer: Medicaid Other | Admitting: Neurology

## 2020-07-24 ENCOUNTER — Ambulatory Visit
Admission: RE | Admit: 2020-07-24 | Discharge: 2020-07-24 | Disposition: A | Discharge status: Home / Self Care | Payer: Medicaid Other | Source: Ambulatory Visit | Attending: Family Medicine | Admitting: Family Medicine

## 2020-07-24 ENCOUNTER — Other Ambulatory Visit: Payer: Self-pay

## 2020-07-24 ENCOUNTER — Ambulatory Visit (INDEPENDENT_AMBULATORY_CARE_PROVIDER_SITE_OTHER): Payer: Medicaid Other

## 2020-07-24 VITALS — BP 136/86 | HR 90 | Temp 98.3°F | Resp 18

## 2020-07-24 DIAGNOSIS — M79672 Pain in left foot: Secondary | ICD-10-CM

## 2020-07-24 DIAGNOSIS — M7732 Calcaneal spur, left foot: Secondary | ICD-10-CM | POA: Diagnosis not present

## 2020-07-24 DIAGNOSIS — S93602A Unspecified sprain of left foot, initial encounter: Secondary | ICD-10-CM | POA: Diagnosis not present

## 2020-07-24 MED ORDER — TRAMADOL HCL 50 MG PO TABS: 50.0000 mg | ORAL_TABLET | Freq: Four times a day (QID) | ORAL | 0 refills | Status: DC | PRN

## 2020-07-24 MED ORDER — MELOXICAM 7.5 MG PO TABS: 7.5000 mg | ORAL_TABLET | Freq: Every day | ORAL | 0 refills | Status: DC

## 2020-07-24 NOTE — Discharge Instructions (Addendum)
Your xray was negative for fracture or dislocation  I have sent in meloxicam for you to take for inflammation  I have sent in tramadol for you to take as needed for breakthrough pain  Follow up with this office or with primary care if symptoms are persisting.  Follow up in the ER for high fever, trouble swallowing, trouble breathing, other concerning symptoms.

## 2020-07-24 NOTE — ED Triage Notes (Addendum)
Pain to 2nd toe and top of foot to LT foot, hit her foot on Saturday.  Also has poss insect bite to RT outer ankle

## 2020-07-24 NOTE — ED Provider Notes (Signed)
[place]   [id number] 07/24/20 Arrival Time: 1251  SE:GBTDV PAIN  SUBJECTIVE: History from: patient. Jaime Massey is a 45 y.o. female complains of left foot pain that began 3 days ago.  Reports that she hit her foot on Saturday.  Denies bruising, excessive swelling.  Has been taking ibuprofen and Tylenol for this with little relief.  Reports pain is worse with activity. Has tried OTC medications without relief.  Denies similar symptoms in the past.  Denies fever, chills, erythema, ecchymosis, effusion, weakness, numbness and tingling, saddle paresthesias, loss of bowel or bladder function.      ROS: As per HPI.  All other pertinent ROS negative.     Past Medical History:  Diagnosis Date  . Anxiety   . Arthritis   . Asthma due to environmental allergies    pt states she has never had asthma problems   . Depression   . DVT (deep venous thrombosis) ([place])    post c-section  . Migraine   . PE (pulmonary embolism)    post c-section  . Seizures ([place])    as child from severe ear infections- no meds now and no seizures since age 55  . Warfarin anticoagulation    took from 03/2010-12/2010.    Past Surgical History:  Procedure Laterality Date  . CESAREAN SECTION    . CHOLECYSTECTOMY N/A 12/21/2012   Procedure: LAPAROSCOPIC CHOLECYSTECTOMY;  Surgeon: Donato Heinz, MD;  Location: [facility];  Service: General;  Laterality: N/A;  . KNEE ARTHROSCOPY WITH MEDIAL MENISECTOMY Right 03/05/2017   Procedure: chondroplasty patella, medial femoral condyle;  Surgeon: Carole Civil, MD;  Location: [facility];  Service: Orthopedics;  Laterality: Right;   Allergies  Allergen Reactions  . Amoxicillin Hives  . Penicillins Rash   Current Facility-Administered Medications on File Prior to Encounter  Medication Dose Route Frequency Provider Last Rate Last Admin  . sodium chloride flush (NS) 0.9 % injection 16 mL  16 mL Intracatheter PRN Melvenia Beam, MD       Current Outpatient  Medications on File Prior to Encounter  Medication Sig Dispense Refill  . acetaminophen (TYLENOL) 500 MG tablet Take 1,500 mg by mouth in the morning and at bedtime.     Marland Kitchen aspirin EC 81 MG tablet Take 1 tablet (81 mg total) by mouth daily. Swallow whole. 30 tablet 11  . buPROPion (WELLBUTRIN XL) 150 MG 24 hr tablet Take 150 mg by mouth daily.    Marland Kitchen gabapentin (NEURONTIN) 100 MG capsule Take 300 mg by mouth at bedtime.    Marland Kitchen ibuprofen (ADVIL,MOTRIN) 800 MG tablet Take 1 tablet (800 mg total) by mouth every 8 (eight) hours as needed. 90 tablet 1  . levonorgestrel (MIRENA, 52 MG,) 20 MCG/24HR IUD 1 each by Intrauterine route once.    Marland Kitchen omeprazole (PRILOSEC) 20 MG capsule Take 20 mg by mouth daily.    . predniSONE (DELTASONE) 10 MG tablet Take 10 mg by mouth daily with breakfast. Takes only as needed.    . traZODone (DESYREL) 50 MG tablet Take 50 mg by mouth at bedtime.     Social History   Socioeconomic History  . Marital status: Divorced    Spouse name: Not on file  . Number of children: 4  . Years of education: Not on file  . Highest education level: Some college, no degree  Occupational History  . Not on file  Tobacco Use  . Smoking status: Former Smoker    Packs/day: 0.50  Years: 26.00    Pack years: 13.00    Types: Cigarettes    Quit date: 10/2017    Years since quitting: 2.8  . Smokeless tobacco: Never Used  Vaping Use  . Vaping Use: Never used  Substance and Sexual Activity  . Alcohol use: Yes    Comment: reports occasional beer but very seldom  . Drug use: No  . Sexual activity: Yes    Birth control/protection: I.U.D.  Other Topics Concern  . Not on file  Social History Narrative   Lives at home with son and boyfriend   Right handed   Caffeine: soda daily   Social Determinants of Health   Financial Resource Strain: Not on file  Food Insecurity: Not on file  Transportation Needs: Not on file  Physical Activity: Not on file  Stress: Not on file  Social  Connections: Not on file  Intimate Partner Violence: Not on file   Family History  Problem Relation Age of Onset  . Breast cancer Maternal Grandmother   . Skin cancer Maternal Grandmother   . Cancer Maternal Grandfather   . Diabetes Paternal Grandmother   . Heart disease Paternal Grandfather   . Breast cancer Mother   . Aortic aneurysm Father   . Stroke Neg Hx        none aware of   . Neuropathy Neg Hx        none aware of     OBJECTIVE:  Vitals:   07/24/20 1357  BP: 136/86  Pulse: 90  Resp: 18  Temp: 98.3 F (36.8 C)  TempSrc: Oral  SpO2: 98%    General appearance: ALERT; in no acute distress.  Head: NCAT Lungs: Normal respiratory effort CV: pulses 2+ bilaterally. Cap refill < 2 seconds Musculoskeletal:  Inspection: Skin warm, dry, clear and intact Effusion to dorsal surface of left foot Palpation: Dorsal surface of left foot tender to palpation ROM: Limited ROM active and passive to left toes Skin: warm and dry Neurologic: Ambulates without difficulty; Sensation intact about the upper/ lower extremities Psychological: alert and cooperative; normal mood and affect  DIAGNOSTIC STUDIES:  DG Foot Complete Left  Result Date: 07/24/2020 CLINICAL DATA:  Pain EXAM: LEFT FOOT - COMPLETE 3+ VIEW COMPARISON:  None. FINDINGS: Frontal, oblique, and lateral views were obtained. There is no fracture or dislocation. Joint spaces appear normal. No erosive change. There are posterior and calcaneal spurs. IMPRESSION: Calcaneal spurs. No appreciable joint space narrowing. No fracture or dislocation. Electronically Signed   By: Lowella Grip III M.D.   On: 07/24/2020 14:10     ASSESSMENT & PLAN:  1. Foot sprain, left, initial encounter   2. Left foot pain   3. Calcaneal spur of left foot       Meds ordered this encounter  Medications  . meloxicam (MOBIC) 7.5 MG tablet    Sig: Take 1 tablet (7.5 mg total) by mouth daily.    Dispense:  30 tablet    Refill:  0    Order  Specific Question:   Supervising Provider    Answer:   Chase Picket [id number]  . traMADol (ULTRAM) 50 MG tablet    Sig: Take 1 tablet (50 mg total) by mouth every 6 (six) hours as needed.    Dispense:  15 tablet    Refill:  0    Order Specific Question:   Supervising Provider    Answer:   Chase Picket [[id number]]   X-ray negative today Meloxicam prescribed Tramadol  prescribed Continue conservative management of rest, ice, and gentle stretches Take ibuprofen as needed for pain relief (may cause abdominal discomfort, ulcers, and GI bleeds avoid taking with other NSAIDs)  Follow up with orthopedics if symptoms persist Return or go to the ER if you have any new or worsening symptoms (fever, chills, chest pain, abdominal pain, changes in bowel or bladder habits, pain radiating into lower legs)   [place] Controlled Substances Registry consulted for this patient. I feel the risk/benefit ratio today is favorable for proceeding with this prescription for a controlled substance. Medication sedation precautions given.  Reviewed expectations re: course of current medical issues. Questions answered. Outlined signs and symptoms indicating need for more acute intervention. Patient verbalized understanding. After Visit Summary given.       Faustino Congress, NP 07/24/20 1437

## 2020-07-27 ENCOUNTER — Encounter: Payer: Medicaid Other | Admitting: Neurology

## 2020-07-30 ENCOUNTER — Other Ambulatory Visit: Payer: Self-pay | Admitting: Orthopedic Surgery

## 2020-07-31 ENCOUNTER — Ambulatory Visit: Payer: Medicaid Other | Admitting: Neurology

## 2020-07-31 ENCOUNTER — Ambulatory Visit (INDEPENDENT_AMBULATORY_CARE_PROVIDER_SITE_OTHER): Payer: Medicaid Other | Admitting: Neurology

## 2020-07-31 DIAGNOSIS — R202 Paresthesia of skin: Secondary | ICD-10-CM

## 2020-07-31 DIAGNOSIS — R531 Weakness: Secondary | ICD-10-CM

## 2020-07-31 DIAGNOSIS — R200 Anesthesia of skin: Secondary | ICD-10-CM | POA: Insufficient documentation

## 2020-07-31 DIAGNOSIS — Z0289 Encounter for other administrative examinations: Secondary | ICD-10-CM

## 2020-07-31 NOTE — Progress Notes (Signed)
See procedure note.

## 2020-07-31 NOTE — Progress Notes (Signed)
History: Acute onset right face, arm and leg weakness, numbness and tingling. Residual symptoms int he right hand and right foot. Stroke workup has been negative (MRI brain WO contrast, carotid U/S and CTA Head, Echo). On Aspirin now. Right hand and foot still numb. Discussed Trans cranial doppler to evaluate further for pfo. Women with migraine with aura have an increased risk of stroke, she needs further evaluation for causes of her acute and residual symptoms.   Today I discussed current work-up today with patient.  Also discussed EMG nerve conduction study findings which was normal, no electrophysiologic evidence for mononeuropathy, polyneuropathy, radiculopathy or muscle disease.  Patient continues to have residual symptoms in the right hand and right foot.  Given the presentation of acute right-sided face arm and leg sensory changes I would suspect an ischemic event.  MRI of the brain was negative however if it was a small stroke we may not see it on MRI, I recommend MRI of the brain (this time with and without contrast as well) to evaluate for any strokes or demyelinating lesions or other etiologies that may have been missed on the first MRI of the brain. I will also check an MRI of the cervical spine with and without contrast to evaluate for demyelinating lesions such as multiple sclerosis or other etiologies of her symptoms.  For further evaluation of PFO I think a transcranial Doppler should be ordered.  Also a 2-week Holter monitor/patch for atrial fibrillation/aflutter.   I spent 30 minutes of face-to-face and non-face-to-face time with patient on the  1. Numbness and tingling of right arm and leg   2. Right sided numbness   3. Acute weakness   4. Acute right-sided weakness    diagnosis.  This included previsit chart review, lab review, study review, order entry, electronic health record documentation, patient education on the different diagnostic and therapeutic options, counseling and  coordination of care, risks and benefits of management, compliance, or risk factor reduction. This does not include time spent on emg/ncs procedure.

## 2020-07-31 NOTE — Procedures (Signed)
Full Name: Jaime Massey Gender: Female MRN #: [id number] Date of Birth: 1976-01-14    Visit Date: 07/31/2020 15:16 Age: 45 Years Examining Physician: Sarina Ill, MD  Requesting Provider: Leeanne Rio, MD Primary Care Provider:  Leeanne Rio, MD  History: Acute onset right face, arm and leg weakness, numbness and tingling. Residual symptoms in the right hand and right foot. Stroke workup has been negative (MRI brain WO contrast, carotid U/S and CTA Head, Echo). On Aspirin now. Right hand and foot still numb, emg/ncs for other causes. Women with migraine with aura have an increased risk of stroke, she needs further evaluation for causes of her acute and residual symptoms(see accompanying note for further details).   Summary: EMG/NCS performed on the right arm and right leg. All nerves and muscles (as indicated in the following tables) were within normal limits.    Conclusion: This is a normal study  Sarina Ill, M.D.  [facility] [address] [place], [place] [address] Tel: [phone] Fax: [phone]  Verbal informed consent was obtained from the patient, patient was informed of potential risk of procedure, including bruising, bleeding, hematoma formation, infection, muscle weakness, muscle pain, numbness, among others.        [place]    Nerve / Sites Muscle Latency Ref. Amplitude Ref. Rel Amp Segments Distance Velocity Ref. Area    ms ms mV mV %  cm m/s m/s mVms  R Median - APB     Wrist APB 3.7 ?4.4 7.8 ?4.0 100 Wrist - APB 7   30.8     Upper arm APB 7.7  6.5  82.4 Upper arm - Wrist 22 54 ?49 24.3  R Ulnar - ADM     Wrist ADM 2.5 ?3.3 11.3 ?6.0 100 Wrist - ADM 7   35.8     B.Elbow ADM 5.8  10.9  95.9 B.Elbow - Wrist 20 61 ?49 34.4     A.Elbow ADM 7.4  10.7  98.6 A.Elbow - B.Elbow 10 61 ?49 34.1  R Peroneal - EDB     Ankle EDB 3.7 ?6.5 6.0 ?2.0 100 Ankle - EDB 9   18.7     Fib head EDB 10.0  4.9  81.8 Fib head - Ankle 28 44 ?44  16.9     Pop fossa EDB 12.3  4.8  96.9 Pop fossa - Fib head 10 44 ?44 16.7         Pop fossa - Ankle      R Tibial - AH     Ankle AH 4.1 ?5.8 12.1 ?4.0 100 Ankle - AH 9   23.6     Pop fossa AH 13.5  7.3  60.5 Pop fossa - Ankle 39 42 ?41 18.1             SNC    Nerve / Sites Rec. Site Peak Lat Ref.  Amp Ref. Segments Distance Peak Diff Ref.    ms ms V V  cm ms ms  R Sural - Ankle (Calf)     Calf Ankle 3.7 ?4.4 15 ?6 Calf - Ankle 14    R Superficial peroneal - Ankle     Lat leg Ankle 3.8 ?4.4 6 ?6 Lat leg - Ankle 14    R Median, Ulnar - Transcarpal comparison     Median Palm Wrist 2.3 ?2.2 50 ?35 Median Palm - Wrist 8       Ulnar Palm Wrist 1.9 ?2.2  25 ?12 Ulnar Palm - Wrist 8          Median Palm - Ulnar Palm  0.4 ?0.4  R Median - Orthodromic (Dig II, Mid palm)     Dig II Wrist 3.3 ?3.4 15 ?10 Dig II - Wrist 13    R Ulnar - Orthodromic, (Dig V, Mid palm)     Dig V Wrist 2.5 ?3.1 6 ?5 Dig V - Wrist 59                 F  Wave    Nerve F Lat Ref.   ms ms  R Tibial - AH 48.6 ?56.0  R Ulnar - ADM 27.0 ?32.0         EMG Summary Table    Spontaneous MUAP Recruitment  Muscle IA Fib PSW Fasc Other Amp Dur. Poly Pattern  R. Triceps brachii Normal None None None _______ Normal Normal Normal Normal  R. Deltoid Normal None None None _______ Normal Normal Normal Normal  R. Cervical paraspinals (low) Normal None None None _______ Normal Normal Normal Normal  R. Pronator teres Normal None None None _______ Normal Normal Normal Normal  R. First dorsal interosseous Normal None None None _______ Normal Normal Normal Normal  R. Opponens pollicis Normal None None None _______ Normal Normal Normal Normal  R. Vastus medialis Normal None None None _______ Normal Normal Normal Normal  R. Tibialis anterior Normal None None None _______ Normal Normal Normal Normal  R. Gastrocnemius (Medial head) Normal None None None _______ Normal Normal Normal Normal  R. Abductor hallucis Normal None None None  _______ Normal Normal Normal Normal  R. Biceps femoris (long head) Normal None None None _______ Normal Normal Normal Normal  R. Gluteus maximus Normal None None None _______ Normal Normal Normal Normal  R. Gluteus medius Normal None None None _______ Normal Normal Normal Normal  R. Lumbar paraspinals (low) Normal None None None _______ Normal Normal Normal Normal

## 2020-07-31 NOTE — Telephone Encounter (Signed)
Rx request 

## 2020-07-31 NOTE — Progress Notes (Signed)
Full Name: Jaime Massey Gender: Female MRN #: [id number] Date of Birth: [birth year]    Visit Date: 07/31/2020 15:16 Age: 45 Years Examining Physician: Sarina Ill, MD  Requesting Provider: Leeanne Rio, MD Primary Care Provider:  Leeanne Rio, MD  History: Acute onset right face, arm and leg weakness, numbness and tingling. Residual symptoms in the right hand and right foot. Stroke workup has been negative (MRI brain WO contrast, carotid U/S and CTA Head, Echo). On Aspirin now. Right hand and foot still numb, emg/ncs for other causes. Women with migraine with aura have an increased risk of stroke, she needs further evaluation for causes of her acute and residual symptoms(see accompanying note for further details).   Summary: EMG/NCS performed on the right arm and right leg. All nerves and muscles (as indicated in the following tables) were within normal limits.    Conclusion: This is a normal study  Sarina Ill, M.D.  [facility] [address] [place], [place] [address] Tel: [phone] Fax: [phone]  Verbal informed consent was obtained from the patient, patient was informed of potential risk of procedure, including bruising, bleeding, hematoma formation, infection, muscle weakness, muscle pain, numbness, among others.        [place]    Nerve / Sites Muscle Latency Ref. Amplitude Ref. Rel Amp Segments Distance Velocity Ref. Area    ms ms mV mV %  cm m/s m/s mVms  R Median - APB     Wrist APB 3.7 ?4.4 7.8 ?4.0 100 Wrist - APB 7   30.8     Upper arm APB 7.7  6.5  82.4 Upper arm - Wrist 22 54 ?49 24.3  R Ulnar - ADM     Wrist ADM 2.5 ?3.3 11.3 ?6.0 100 Wrist - ADM 7   35.8     B.Elbow ADM 5.8  10.9  95.9 B.Elbow - Wrist 20 61 ?49 34.4     A.Elbow ADM 7.4  10.7  98.6 A.Elbow - B.Elbow 10 61 ?49 34.1  R Peroneal - EDB     Ankle EDB 3.7 ?6.5 6.0 ?2.0 100 Ankle - EDB 9   18.7     Fib head EDB 10.0  4.9  81.8 Fib head - Ankle 28 44  ?44 16.9     Pop fossa EDB 12.3  4.8  96.9 Pop fossa - Fib head 10 44 ?44 16.7         Pop fossa - Ankle      R Tibial - AH     Ankle AH 4.1 ?5.8 12.1 ?4.0 100 Ankle - AH 9   23.6     Pop fossa AH 13.5  7.3  60.5 Pop fossa - Ankle 39 42 ?41 18.1             SNC    Nerve / Sites Rec. Site Peak Lat Ref.  Amp Ref. Segments Distance Peak Diff Ref.    ms ms V V  cm ms ms  R Sural - Ankle (Calf)     Calf Ankle 3.7 ?4.4 15 ?6 Calf - Ankle 14    R Superficial peroneal - Ankle     Lat leg Ankle 3.8 ?4.4 6 ?6 Lat leg - Ankle 14    R Median, Ulnar - Transcarpal comparison     Median Palm Wrist 2.3 ?2.2 50 ?35 Median Palm - Wrist 8       Ulnar Palm Wrist 1.9 ?2.2  25 ?12 Ulnar Palm - Wrist 8          Median Palm - Ulnar Palm  0.4 ?0.4  R Median - Orthodromic (Dig II, Mid palm)     Dig II Wrist 3.3 ?3.4 15 ?10 Dig II - Wrist 13    R Ulnar - Orthodromic, (Dig V, Mid palm)     Dig V Wrist 2.5 ?3.1 6 ?5 Dig V - Wrist 11                 F  Wave    Nerve F Lat Ref.   ms ms  R Tibial - AH 48.6 ?56.0  R Ulnar - ADM 27.0 ?32.0         EMG Summary Table    Spontaneous MUAP Recruitment  Muscle IA Fib PSW Fasc Other Amp Dur. Poly Pattern  R. Triceps brachii Normal None None None _______ Normal Normal Normal Normal  R. Deltoid Normal None None None _______ Normal Normal Normal Normal  R. Cervical paraspinals (low) Normal None None None _______ Normal Normal Normal Normal  R. Pronator teres Normal None None None _______ Normal Normal Normal Normal  R. First dorsal interosseous Normal None None None _______ Normal Normal Normal Normal  R. Opponens pollicis Normal None None None _______ Normal Normal Normal Normal  R. Vastus medialis Normal None None None _______ Normal Normal Normal Normal  R. Tibialis anterior Normal None None None _______ Normal Normal Normal Normal  R. Gastrocnemius (Medial head) Normal None None None _______ Normal Normal Normal Normal  R. Abductor hallucis Normal None None None  _______ Normal Normal Normal Normal  R. Biceps femoris (long head) Normal None None None _______ Normal Normal Normal Normal  R. Gluteus maximus Normal None None None _______ Normal Normal Normal Normal  R. Gluteus medius Normal None None None _______ Normal Normal Normal Normal  R. Lumbar paraspinals (low) Normal None None None _______ Normal Normal Normal Normal

## 2020-08-01 ENCOUNTER — Telehealth: Payer: Self-pay | Admitting: Neurology

## 2020-08-01 NOTE — Telephone Encounter (Signed)
[facility] pending faxed notes.

## 2020-08-03 NOTE — Telephone Encounter (Signed)
[facility] auth: Brain w/wo contrast: [id number] & cervical spine w/wo contrast [id number] (exp. 08/01/20 to 09/30/20).  Order faxed to [facility] because that is who is in net work with the patient plan. They will reach out to the patient to schedule.

## 2020-08-03 NOTE — Telephone Encounter (Signed)
schedule for 2/9 945AM

## 2020-08-11 ENCOUNTER — Ambulatory Visit (INDEPENDENT_AMBULATORY_CARE_PROVIDER_SITE_OTHER): Payer: Medicaid Other

## 2020-08-11 DIAGNOSIS — R42 Dizziness and giddiness: Secondary | ICD-10-CM | POA: Diagnosis not present

## 2020-08-11 DIAGNOSIS — R531 Weakness: Secondary | ICD-10-CM

## 2020-08-11 DIAGNOSIS — I4891 Unspecified atrial fibrillation: Secondary | ICD-10-CM

## 2020-08-11 DIAGNOSIS — R202 Paresthesia of skin: Secondary | ICD-10-CM

## 2020-08-11 DIAGNOSIS — R200 Anesthesia of skin: Secondary | ICD-10-CM

## 2020-08-16 ENCOUNTER — Ambulatory Visit: Payer: Medicaid Other | Admitting: Neurology

## 2020-08-30 ENCOUNTER — Other Ambulatory Visit: Payer: Self-pay | Admitting: Neurology

## 2020-08-30 DIAGNOSIS — R531 Weakness: Secondary | ICD-10-CM

## 2020-08-30 DIAGNOSIS — M4802 Spinal stenosis, cervical region: Secondary | ICD-10-CM | POA: Insufficient documentation

## 2020-08-30 DIAGNOSIS — R42 Dizziness and giddiness: Secondary | ICD-10-CM

## 2020-08-30 DIAGNOSIS — R200 Anesthesia of skin: Secondary | ICD-10-CM

## 2020-08-30 DIAGNOSIS — I4891 Unspecified atrial fibrillation: Secondary | ICD-10-CM

## 2020-08-31 ENCOUNTER — Telehealth: Payer: Self-pay | Admitting: *Deleted

## 2020-08-31 ENCOUNTER — Ambulatory Visit (HOSPITAL_BASED_OUTPATIENT_CLINIC_OR_DEPARTMENT_OTHER)
Admission: RE | Admit: 2020-08-31 | Discharge: 2020-08-31 | Disposition: A | Discharge status: Home / Self Care | Payer: Medicaid Other | Source: Ambulatory Visit | Attending: Neurology | Admitting: Neurology

## 2020-08-31 ENCOUNTER — Ambulatory Visit (HOSPITAL_COMMUNITY)
Admission: RE | Admit: 2020-08-31 | Discharge: 2020-08-31 | Disposition: A | Discharge status: Home / Self Care | Payer: Medicaid Other | Source: Ambulatory Visit | Attending: Neurology | Admitting: Neurology

## 2020-08-31 ENCOUNTER — Other Ambulatory Visit: Payer: Self-pay

## 2020-08-31 DIAGNOSIS — R531 Weakness: Secondary | ICD-10-CM | POA: Diagnosis present

## 2020-08-31 DIAGNOSIS — I639 Cerebral infarction, unspecified: Secondary | ICD-10-CM

## 2020-08-31 DIAGNOSIS — R202 Paresthesia of skin: Secondary | ICD-10-CM | POA: Diagnosis present

## 2020-08-31 DIAGNOSIS — R200 Anesthesia of skin: Secondary | ICD-10-CM | POA: Diagnosis not present

## 2020-08-31 NOTE — Telephone Encounter (Signed)
Spoke with patient and let her know that her carotid ultrasound showed no significant carotid stenosis and her transcranial Dopplers was negative for PFO.  She verbalized understanding and appreciation.  She did not have any questions at this time but was encouraged to call back if she has any later.

## 2020-08-31 NOTE — Telephone Encounter (Addendum)
-----   Message from Melvenia Beam, MD sent at 08/31/2020  3:22 PM EST -----No significant carotid stenosis thanks  Melvenia Beam, MD  08/31/2020 3:21 PM EST      Negative for PFO, great news. Dr. Jaynee Eagles

## 2020-08-31 NOTE — Progress Notes (Signed)
TCD bubble study and carotid duplex has been completed.   Preliminary results in CV Proc.   Jaime Massey 08/31/2020 2:19 PM

## 2020-09-06 ENCOUNTER — Other Ambulatory Visit: Payer: Self-pay

## 2020-09-06 ENCOUNTER — Encounter: Payer: Self-pay | Admitting: Orthopedic Surgery

## 2020-09-06 ENCOUNTER — Ambulatory Visit (INDEPENDENT_AMBULATORY_CARE_PROVIDER_SITE_OTHER): Payer: Medicaid Other | Admitting: Orthopedic Surgery

## 2020-09-06 ENCOUNTER — Ambulatory Visit: Payer: Medicaid Other

## 2020-09-06 VITALS — BP 123/76 | HR 72 | Ht 69.0 in | Wt 231.0 lb

## 2020-09-06 DIAGNOSIS — G8929 Other chronic pain: Secondary | ICD-10-CM

## 2020-09-06 DIAGNOSIS — M1711 Unilateral primary osteoarthritis, right knee: Secondary | ICD-10-CM

## 2020-09-06 NOTE — Progress Notes (Signed)
Chief Complaint  Patient presents with  . Knee Pain    Rt knee pain and swelling.     45 year old female with chronic right knee pain with recurrent swelling.  The patient has been undergoing work-up for her possibility of a stroke has multiple MRIs scheduled  Complains of recurrent swelling in her right knee with recurrent right knee pain.  She had an arthroscopy many years ago in the right knee  Today's x-ray shows degenerative changes primarily in the patellofemoral joint and a small amount also laterally  Her exam shows that she has diffuse joint pain small effusion she maintains a good range of motion with no ligamentous instability  Recommend MRI to rule out synovitis meniscal tear or ligament injury as a cause of the recurrent swelling  Encounter Diagnoses  Name Primary?  Marland Kitchen Arthritis of knee, right Yes  . Chronic pain of right knee

## 2020-09-08 ENCOUNTER — Other Ambulatory Visit: Payer: Self-pay

## 2020-09-11 MED ORDER — PREDNISONE 10 MG PO TABS: 10.0000 mg | ORAL_TABLET | Freq: Every day | ORAL | 0 refills | Status: DC

## 2020-09-12 ENCOUNTER — Other Ambulatory Visit: Payer: Self-pay | Admitting: Orthopedic Surgery

## 2020-09-12 DIAGNOSIS — M79672 Pain in left foot: Secondary | ICD-10-CM

## 2020-09-13 ENCOUNTER — Other Ambulatory Visit: Payer: Self-pay

## 2020-09-13 ENCOUNTER — Telehealth: Payer: Self-pay | Admitting: Neurology

## 2020-09-13 ENCOUNTER — Ambulatory Visit (HOSPITAL_COMMUNITY)
Admission: RE | Admit: 2020-09-13 | Discharge: 2020-09-13 | Disposition: A | Discharge status: Home / Self Care | Payer: Medicaid Other | Source: Ambulatory Visit | Attending: Orthopedic Surgery | Admitting: Orthopedic Surgery

## 2020-09-13 DIAGNOSIS — G8929 Other chronic pain: Secondary | ICD-10-CM

## 2020-09-13 DIAGNOSIS — M25561 Pain in right knee: Secondary | ICD-10-CM | POA: Insufficient documentation

## 2020-09-13 NOTE — Telephone Encounter (Signed)
Spoke with the patient discussed the brain and cervical spine results.  She verbalized understanding and appreciation for the call and she did not have any questions.

## 2020-09-13 NOTE — Telephone Encounter (Signed)
MRI of the brain was normal for age. MRI cervical spine showed arthritis at several levels (expected) but nothing significant or anything that would cause her symptoms (Spinal cord is normal).  thanks. MRIs were 2/24 I thought we resulted them already.

## 2020-09-21 ENCOUNTER — Ambulatory Visit (INDEPENDENT_AMBULATORY_CARE_PROVIDER_SITE_OTHER): Payer: Medicaid Other | Admitting: Orthopedic Surgery

## 2020-09-21 ENCOUNTER — Other Ambulatory Visit: Payer: Self-pay

## 2020-09-21 DIAGNOSIS — G8929 Other chronic pain: Secondary | ICD-10-CM

## 2020-09-21 DIAGNOSIS — S83281D Other tear of lateral meniscus, current injury, right knee, subsequent encounter: Secondary | ICD-10-CM | POA: Diagnosis not present

## 2020-09-21 DIAGNOSIS — M1711 Unilateral primary osteoarthritis, right knee: Secondary | ICD-10-CM

## 2020-09-21 MED ORDER — MELOXICAM 7.5 MG PO TABS: 7.5000 mg | ORAL_TABLET | Freq: Two times a day (BID) | ORAL | 5 refills | Status: DC

## 2020-09-21 NOTE — Patient Instructions (Signed)
Maintain a good weight  Increase prednisone when having swelling episodes  Take meloxicam 7.5 mg twice daily  Follow-up in a year

## 2020-09-21 NOTE — Progress Notes (Signed)
Chief Complaint  Patient presents with  . Results    45 year old female diffuse knee pain swelling difficulty kneeling had MRI right knee previous arthroscopy also noted  I reviewed the MRI images and I see the following MRI shows meniscal cyst lateral meniscal tear inconsistent with patient's symptoms.  After discussion decided to increase her meloxicam to twice daily  This is also noted on MRI report  Prednisone as needed when having significant swelling episodes  Weight loss   Injection, last injection 1 year ago  Follow-up in a year  Encounter Diagnoses  Name Primary?  Marland Kitchen Arthritis of knee, right Yes  . Chronic pain of right knee   . Other tear of lateral meniscus of right knee, unspecified whether old or current tear, subsequent encounter    Meds ordered this encounter  Medications  . meloxicam (MOBIC) 7.5 MG tablet    Sig: Take 1 tablet (7.5 mg total) by mouth in the morning and at bedtime.    Dispense:  60 tablet    Refill:  5    Patient given consent for injection right knee Celestone injected 6 mg 1% Sensorcaine tolerated without complication

## 2020-12-14 ENCOUNTER — Other Ambulatory Visit: Payer: Self-pay

## 2020-12-18 ENCOUNTER — Ambulatory Visit: Payer: Medicaid Other

## 2020-12-18 ENCOUNTER — Other Ambulatory Visit: Payer: Self-pay

## 2020-12-18 ENCOUNTER — Ambulatory Visit (INDEPENDENT_AMBULATORY_CARE_PROVIDER_SITE_OTHER): Payer: Medicaid Other | Admitting: Orthopedic Surgery

## 2020-12-18 ENCOUNTER — Encounter: Payer: Self-pay | Admitting: Orthopedic Surgery

## 2020-12-18 VITALS — BP 140/94 | HR 77 | Ht 69.0 in | Wt 235.8 lb

## 2020-12-18 DIAGNOSIS — M25561 Pain in right knee: Secondary | ICD-10-CM

## 2020-12-18 DIAGNOSIS — M23321 Other meniscus derangements, posterior horn of medial meniscus, right knee: Secondary | ICD-10-CM | POA: Diagnosis not present

## 2020-12-18 DIAGNOSIS — G8929 Other chronic pain: Secondary | ICD-10-CM

## 2020-12-18 NOTE — Patient Instructions (Addendum)
Ice right knee  Wear brace  Take meloxicam  Take your tramadol  Take it easy  If you get the MRI I will see you after the MRI if not we will call you and set up the therapy depending on [facility]  While we are working on your approval for MRI please go ahead and call to schedule your appointment with [place] within at least one (1) week.   Central Scheduling [phone]

## 2020-12-18 NOTE — Progress Notes (Signed)
Chief Complaint  Patient presents with   Knee Pain    Right knee/pt states she stood up and felt knee pop about 9 days ago    45 year old female stood up felt sudden acute pain medial side right knee.  Date of injury was June 4.  She took over-the-counter NSAIDs and wear knee sleeve.  Since that time the pain has not subsided she has had catching and locking and mechanical symptoms  Orthopedic exam the patient is ambulatory but is limping  She has tenderness on the medial joint line she has a positive effusion she has a positive McMurray's sign she can extend her knee the last 20 degrees passively or actively  The ligaments feel stable.  Neurovascular exam is intact.  Skin was normal.  Patient was awake alert and oriented x3 mood and affect was normal.  X-ray showed mild patellofemoral arthritis normal alignment  Differential diagnoses torn medial meniscus, strain medial collateral ligament  Recommend MRI right knee for torn medial meniscus  In the meantime use ice economy hinged brace meloxicam and tramadol  Encounter Diagnoses  Name Primary?   Acute pain of right knee Yes   Derangement of posterior horn of medial meniscus of right knee

## 2020-12-29 ENCOUNTER — Ambulatory Visit (HOSPITAL_COMMUNITY)
Admission: RE | Admit: 2020-12-29 | Discharge: 2020-12-29 | Disposition: A | Discharge status: Home / Self Care | Payer: Medicaid Other | Source: Ambulatory Visit | Attending: Orthopedic Surgery | Admitting: Orthopedic Surgery

## 2020-12-29 ENCOUNTER — Other Ambulatory Visit: Payer: Self-pay

## 2020-12-29 ENCOUNTER — Ambulatory Visit (HOSPITAL_COMMUNITY): Payer: Medicaid Other

## 2020-12-29 ENCOUNTER — Encounter (HOSPITAL_COMMUNITY): Payer: Self-pay

## 2020-12-29 DIAGNOSIS — M25561 Pain in right knee: Secondary | ICD-10-CM

## 2020-12-29 DIAGNOSIS — M23321 Other meniscus derangements, posterior horn of medial meniscus, right knee: Secondary | ICD-10-CM | POA: Diagnosis present

## 2021-01-01 ENCOUNTER — Other Ambulatory Visit: Payer: Self-pay

## 2021-01-01 ENCOUNTER — Ambulatory Visit (INDEPENDENT_AMBULATORY_CARE_PROVIDER_SITE_OTHER): Payer: Medicaid Other | Admitting: Orthopedic Surgery

## 2021-01-01 DIAGNOSIS — G8929 Other chronic pain: Secondary | ICD-10-CM | POA: Diagnosis not present

## 2021-01-01 DIAGNOSIS — M25561 Pain in right knee: Secondary | ICD-10-CM | POA: Diagnosis not present

## 2021-01-01 DIAGNOSIS — M79672 Pain in left foot: Secondary | ICD-10-CM

## 2021-01-01 MED ORDER — TRAMADOL HCL 50 MG PO TABS: 50.0000 mg | ORAL_TABLET | Freq: Four times a day (QID) | ORAL | 0 refills | Status: DC | PRN

## 2021-01-01 MED ORDER — MELOXICAM 7.5 MG PO TABS: 7.5000 mg | ORAL_TABLET | Freq: Two times a day (BID) | ORAL | 5 refills | Status: DC

## 2021-01-01 NOTE — Patient Instructions (Signed)
Wear knee brace   Take these medications:   Meds ordered this encounter  Medications   traMADol (ULTRAM) 50 MG tablet    Sig: Take 1 tablet (50 mg total) by mouth every 6 (six) hours as needed for up to 5 days.    Dispense:  20 tablet    Refill:  0   meloxicam (MOBIC) 7.5 MG tablet    Sig: Take 1 tablet (7.5 mg total) by mouth in the morning and at bedtime.    Dispense:  60 tablet    Refill:  5   Ice the knee when swollen

## 2021-01-01 NOTE — Progress Notes (Signed)
FOLLOW UP   Encounter Diagnoses  Name Primary?   Left foot pain    Acute pain of right knee Yes   Chronic pain of right knee      Chief Complaint  Patient presents with   Follow-up    Recheck on right knee     MRI was obtained of the right knee  There is cartilage degeneration medial and patellofemoral compartments with near full-thickness cartilage loss there is no meniscal tear that is surgical there is maybe a superior surface tear laterally with some evidence of possible cyst  Patient is 24 she is not a surgical candidate because of her age  Recommend continue meloxicam and tramadol follow-up as needed  Meds ordered this encounter  Medications   traMADol (ULTRAM) 50 MG tablet    Sig: Take 1 tablet (50 mg total) by mouth every 6 (six) hours as needed for up to 5 days.    Dispense:  20 tablet    Refill:  0   meloxicam (MOBIC) 7.5 MG tablet    Sig: Take 1 tablet (7.5 mg total) by mouth in the morning and at bedtime.    Dispense:  60 tablet    Refill:  5      Right knee injection   Medication Depo-Medrol 40 and lidocaine 1% 2 cc  Skin alcohol ethyl chloride  Injection lateral approach with knee flexed  No complications

## 2021-01-11 ENCOUNTER — Ambulatory Visit: Payer: Medicaid Other | Admitting: Orthopedic Surgery

## 2021-01-24 ENCOUNTER — Other Ambulatory Visit: Payer: Self-pay

## 2021-01-24 ENCOUNTER — Ambulatory Visit: Payer: Medicaid Other | Admitting: Podiatry

## 2021-01-24 DIAGNOSIS — L603 Nail dystrophy: Secondary | ICD-10-CM

## 2021-01-24 NOTE — Progress Notes (Signed)
   HPI: 45 y.o. female presenting today as a new patient for evaluation of thickening with discoloration and partial detachment of the bilateral great toenails.  Patient states that she caught her right toenail on something while walking and it ripped the toenail off.  She noticed a lot of bleeding to the area.  This happened just a few days ago.  She states that most recently the pain has subsided somewhat and she presents for further treatment and evaluation.  She states over the last couple years she has had thick toenails that are partially detached from the underlying nailbed  Past Medical History:  Diagnosis Date   Anxiety    Arthritis    Asthma due to environmental allergies    pt states she has never had asthma problems    Depression    DVT (deep venous thrombosis) (HCC)    post c-section   Migraine    PE (pulmonary embolism)    post c-section   Seizures ([place])    as child from severe ear infections- no meds now and no seizures since age 34   Warfarin anticoagulation    took from 03/2010-12/2010.      Physical Exam: General: The patient is alert and oriented x3 in no acute distress.  Dermatology: Skin is warm, dry and supple bilateral lower extremities. Negative for open lesions or macerations.  Hyperkeratotic elongated dystrophic nails noted to the bilateral great toes.  Partial detachment of the right hallux nail plate noted.  Vascular: Palpable pedal pulses bilaterally. No edema or erythema noted. Capillary refill within normal limits.  Neurological: Epicritic and protective threshold grossly intact bilaterally.   Musculoskeletal Exam: No pedal deformities noted  Assessment: 1.  Partially detached dystrophic toenail right 2.  Dystrophic toenail likely secondary to trauma bilateral great toes   Plan of Care:  1. Patient evaluated.  2.  Today we discussed different treatment options.  I do believe it is in the patient's best interest to totally remove the right hallux nail  plate and allow for new nail growth.  I explained to the patient that I do not have any control over how the new nail grows.  Patient understands.  She would like to proceed with total temporary nail avulsion of the right hallux nail plate 3.  The toe was prepped in aseptic manner and digital block performed using 2% lidocaine plain.  The nail was avulsed in its entirety and a light dressing applied with post care instructions provided 4.  Return to clinic as needed, the patient states that she would like to have the left hallux nail plate totally removed at the end of the summer.  We also discussed possible total permanent nail avulsions if the nails regrow dystrophic again  *Home caregiver      Edrick Kins, DPM [facility]  Dr. Edrick Kins, DPM    [address], [place] [address]                Office [phone]  Fax [phone]

## 2021-01-25 ENCOUNTER — Telehealth: Payer: Self-pay | Admitting: Radiology

## 2021-01-25 ENCOUNTER — Other Ambulatory Visit: Payer: Self-pay | Admitting: Orthopaedic Surgery

## 2021-01-25 MED ORDER — MELOXICAM 15 MG PO TABS: 15.0000 mg | ORAL_TABLET | Freq: Every day | ORAL | 0 refills | Status: DC

## 2021-01-25 NOTE — Telephone Encounter (Signed)
New Rx sent to pharmacy per Dr Luna Glasgow.

## 2021-01-25 NOTE — Telephone Encounter (Signed)
Dr Aline Brochure prescribed Mobic 7.5 mg BID, and patient's insurance will cover 15 mg per day.  Ok to advise pharmacy to change to 15 mg per day?

## 2021-02-28 ENCOUNTER — Other Ambulatory Visit: Payer: Self-pay | Admitting: Orthopedic Surgery

## 2021-02-28 DIAGNOSIS — M79672 Pain in left foot: Secondary | ICD-10-CM

## 2021-06-11 ENCOUNTER — Other Ambulatory Visit: Payer: Self-pay

## 2021-06-11 ENCOUNTER — Ambulatory Visit: Payer: Medicaid Other

## 2021-06-11 ENCOUNTER — Encounter: Payer: Self-pay | Admitting: Orthopedic Surgery

## 2021-06-11 ENCOUNTER — Ambulatory Visit (INDEPENDENT_AMBULATORY_CARE_PROVIDER_SITE_OTHER): Payer: Medicaid Other | Admitting: Orthopedic Surgery

## 2021-06-11 VITALS — BP 145/85 | HR 91 | Ht 69.0 in | Wt 230.0 lb

## 2021-06-11 DIAGNOSIS — M25562 Pain in left knee: Secondary | ICD-10-CM

## 2021-06-11 DIAGNOSIS — S83242A Other tear of medial meniscus, current injury, left knee, initial encounter: Secondary | ICD-10-CM

## 2021-06-11 DIAGNOSIS — G8929 Other chronic pain: Secondary | ICD-10-CM

## 2021-06-11 MED ORDER — ACETAMINOPHEN-CODEINE #3 300-30 MG PO TABS: 1.0000 | ORAL_TABLET | Freq: Four times a day (QID) | ORAL | 0 refills | Status: DC | PRN

## 2021-06-11 NOTE — Patient Instructions (Signed)
Ice   Meloxicam  Pain meds'

## 2021-06-11 NOTE — Progress Notes (Signed)
Chief Complaint  Patient presents with   Knee Injury    Fall 06/09/21 feels like knee buckled or shoe caught and she fell hurting left knee    45 year old female I have been having some trouble with the left knee with some popping and catching like sensations fell onto the left knee December 3 now complains of worsening medial knee pain and some pain over the proximal tibia and she has a large bruise in that proximal tibia region on the medial side of the knee.  She did try brace that was actually for her right leg and she also took some prednisone she takes meloxicam and gabapentin for other reasons.  Review of systems corrected vision no fever no chills no hearing loss no chest pain no shortness of breath she does complain of some tingling-like sensations that come and go of the left knee which she injured   BP (!) 145/85   Pulse 91   Ht 5' 9"  (1.753 m)   Wt 230 lb (104.3 kg)   BMI 33.97 kg/m   General appearance is normal grooming hygiene normal  Cardiovascular normal pulse perfusion color capillary refill  Normal sensation  Musculoskeletal  Left knee no effusion medial joint line tenderness yes Large bruise medial proximal tibia Pain and block to 10 degrees of extension flexion normal Ligaments stable  X-ray mild arthritis medial compartment no acute fracture  Assessment and plan I bit she has a medial meniscal tear but she has Medicaid so we have to wait 4 weeks to treat with anti-inflammatories I will also add some pain medication she will come back in 4 weeks if no improvement will get the MRI for the torn medial meniscus which I think she has  Meds ordered this encounter  Medications   acetaminophen-codeine (TYLENOL #3) 300-30 MG tablet    Sig: Take 1 tablet by mouth every 6 (six) hours as needed for moderate pain.    Dispense:  30 tablet    Refill:  0

## 2021-07-02 ENCOUNTER — Other Ambulatory Visit: Payer: Self-pay | Admitting: Orthopedic Surgery

## 2021-07-12 ENCOUNTER — Ambulatory Visit (INDEPENDENT_AMBULATORY_CARE_PROVIDER_SITE_OTHER): Payer: Medicaid Other | Admitting: Orthopedic Surgery

## 2021-07-12 ENCOUNTER — Other Ambulatory Visit: Payer: Self-pay

## 2021-07-12 ENCOUNTER — Encounter: Payer: Self-pay | Admitting: Orthopedic Surgery

## 2021-07-12 DIAGNOSIS — G8929 Other chronic pain: Secondary | ICD-10-CM | POA: Diagnosis not present

## 2021-07-12 DIAGNOSIS — M25562 Pain in left knee: Secondary | ICD-10-CM

## 2021-07-12 NOTE — Patient Instructions (Signed)
While we are working on your approval for MRI please go ahead and call to schedule your appointment with Jaime Massey within at least one (1) week.   Central Scheduling [phone]

## 2021-07-12 NOTE — Progress Notes (Signed)
Jaime Massey is here for a follow-up  She fell on June 09, 2021 injured her left knee.  She sustained a large bruise over the medial tibia and she was put on meloxicam x-ray showed mild osteoarthritis she was given some Tylenol 3 for pain  She presents with continued pain over the medial joint line  Her right knee exam is consistent with an effusion medial joint line tenderness and positive meniscal signs  Recommend MRI left knee.  She probably has a medial meniscal tear.  I like to confirm that before recommending surgery  I placed her in an economy hinged brace and I will see her after the MRI for reading and results and surgery scheduling if needed

## 2021-07-20 ENCOUNTER — Other Ambulatory Visit: Payer: Self-pay

## 2021-07-20 ENCOUNTER — Ambulatory Visit (HOSPITAL_COMMUNITY)
Admission: RE | Admit: 2021-07-20 | Discharge: 2021-07-20 | Disposition: A | Discharge status: Home / Self Care | Payer: Medicaid Other | Source: Ambulatory Visit | Attending: Orthopedic Surgery | Admitting: Orthopedic Surgery

## 2021-07-20 DIAGNOSIS — M25562 Pain in left knee: Secondary | ICD-10-CM | POA: Insufficient documentation

## 2021-07-20 DIAGNOSIS — G8929 Other chronic pain: Secondary | ICD-10-CM | POA: Diagnosis present

## 2021-07-25 ENCOUNTER — Other Ambulatory Visit: Payer: Self-pay

## 2021-07-25 ENCOUNTER — Ambulatory Visit (INDEPENDENT_AMBULATORY_CARE_PROVIDER_SITE_OTHER): Payer: Medicaid Other | Admitting: Orthopedic Surgery

## 2021-07-25 ENCOUNTER — Ambulatory Visit: Payer: Medicaid Other | Admitting: Orthopedic Surgery

## 2021-07-25 DIAGNOSIS — M1712 Unilateral primary osteoarthritis, left knee: Secondary | ICD-10-CM | POA: Diagnosis not present

## 2021-07-25 DIAGNOSIS — M1710 Unilateral primary osteoarthritis, unspecified knee: Secondary | ICD-10-CM

## 2021-07-25 DIAGNOSIS — M25562 Pain in left knee: Secondary | ICD-10-CM

## 2021-07-25 DIAGNOSIS — G8929 Other chronic pain: Secondary | ICD-10-CM

## 2021-07-25 NOTE — Progress Notes (Signed)
Virtual Visit via Telephone Note  I connected with Jaime Massey on 07/25/21 at 11:50 AM EST by telephone and verified that I am speaking with the correct person using two identifiers.  Location: Patient: Jaime Massey is out of the office at home I believe Provider: In the office   I discussed the limitations, risks, security and privacy concerns of performing an evaluation and management service by telephone and the availability of in person appointments. I also discussed with the patient that there may be a patient responsible charge related to this service. The patient expressed understanding and agreed to proceed.   History of Present Illness: 46 year old with painful left knee after fall on December 3.  She had a large bruise over the medial side of the tibia she was put on meloxicam she had an x-ray that showed mild arthritis she was given some Tylenol 3 for pain but had an MRI due to persistent symptoms after a period of nonoperative care    Observations/Objective:  Looked at her MRI she has no meniscal tear she does have arthritis in all 3 compartments Assessment and Plan:  Osteoarthritis  Recommend nonoperative treatment Follow Up Instructions: Patient agrees to be seen on Friday for injections in both knees   I discussed the assessment and treatment plan with the patient. The patient was provided an opportunity to ask questions and all were answered. The patient agreed with the plan and demonstrated an understanding of the instructions.   The patient was advised to call back or seek an in-person evaluation if the symptoms worsen or if the condition fails to improve as anticipated.  I provided 10 minutes of non-face-to-face time during this encounter.   Arther Abbott, MD

## 2021-07-27 ENCOUNTER — Other Ambulatory Visit: Payer: Self-pay

## 2021-07-27 ENCOUNTER — Ambulatory Visit (INDEPENDENT_AMBULATORY_CARE_PROVIDER_SITE_OTHER): Payer: Medicaid Other | Admitting: Orthopedic Surgery

## 2021-07-27 DIAGNOSIS — G8929 Other chronic pain: Secondary | ICD-10-CM | POA: Diagnosis not present

## 2021-07-27 DIAGNOSIS — M25562 Pain in left knee: Secondary | ICD-10-CM | POA: Diagnosis not present

## 2021-07-27 DIAGNOSIS — M25561 Pain in right knee: Secondary | ICD-10-CM

## 2021-07-27 DIAGNOSIS — M1710 Unilateral primary osteoarthritis, unspecified knee: Secondary | ICD-10-CM

## 2021-07-27 DIAGNOSIS — M170 Bilateral primary osteoarthritis of knee: Secondary | ICD-10-CM

## 2021-07-27 MED ORDER — TRAMADOL HCL 50 MG PO TABS: 50.0000 mg | ORAL_TABLET | Freq: Four times a day (QID) | ORAL | 0 refills | Status: AC | PRN

## 2021-07-27 NOTE — Progress Notes (Signed)
Chief Complaint  Patient presents with   Knee Pain    Bilateral knee pain status post MRI,   MRI showed arthritis patient here for injections as the other right knee is hurting as well     Encounter Diagnoses  Name Primary?   Chronic pain of left knee    Primary localized osteoarthritis of knee-LEFT Yes   Chronic pain of right knee     46 year old female had an MRI of her left knee that showed arthritis no evidence of meniscal tear or ligament tear  We decided to proceed with injections.  The right knee was also hurting and is noted to have chronic pain from arthritis and she wanted an injection there as well  Procedure note for bilateral knee injections  Procedure note left knee injection verbal consent was obtained to inject left knee joint  Timeout was completed to confirm the site of injection  The medications used were 40 mg depomedrol and 3 cc of 1% lidocaine  Anesthesia was provided by ethyl chloride and the skin was prepped with alcohol.  After cleaning the skin with alcohol a 20-gauge needle was used to inject the left knee joint. There were no complications. A sterile bandage was applied.   Procedure note right knee injection verbal consent was obtained to inject right knee joint  Timeout was completed to confirm the site of injection  The medications used were 40 mg depomedrol and 3 cc of 1% lidocaine  Anesthesia was provided by ethyl chloride and the skin was prepped with alcohol.  After cleaning the skin with alcohol a 20-gauge needle was used to inject the right knee joint. There were no complications. A sterile bandage was applied.

## 2021-07-30 ENCOUNTER — Ambulatory Visit: Payer: Medicaid Other | Admitting: Orthopedic Surgery

## 2021-08-27 ENCOUNTER — Emergency Department (HOSPITAL_COMMUNITY)
Admission: EM | Admit: 2021-08-27 | Discharge: 2021-08-27 | Disposition: A | Discharge status: Home / Self Care | Payer: Medicaid Other | Attending: Emergency Medicine | Admitting: Emergency Medicine

## 2021-08-27 ENCOUNTER — Encounter (HOSPITAL_COMMUNITY): Payer: Self-pay | Admitting: *Deleted

## 2021-08-27 DIAGNOSIS — R030 Elevated blood-pressure reading, without diagnosis of hypertension: Secondary | ICD-10-CM | POA: Insufficient documentation

## 2021-08-27 NOTE — ED Triage Notes (Signed)
States he blood pressure has been hight for the past week, referred her by PCP

## 2021-09-20 ENCOUNTER — Ambulatory Visit (INDEPENDENT_AMBULATORY_CARE_PROVIDER_SITE_OTHER): Payer: Medicaid Other | Admitting: Orthopedic Surgery

## 2021-09-20 ENCOUNTER — Other Ambulatory Visit: Payer: Self-pay

## 2021-09-20 DIAGNOSIS — M2241 Chondromalacia patellae, right knee: Secondary | ICD-10-CM | POA: Diagnosis not present

## 2021-09-20 DIAGNOSIS — M25561 Pain in right knee: Secondary | ICD-10-CM

## 2021-09-20 DIAGNOSIS — M94261 Chondromalacia, right knee: Secondary | ICD-10-CM | POA: Diagnosis not present

## 2021-09-20 DIAGNOSIS — G8929 Other chronic pain: Secondary | ICD-10-CM | POA: Diagnosis not present

## 2021-09-20 DIAGNOSIS — Z9889 Other specified postprocedural states: Secondary | ICD-10-CM | POA: Diagnosis not present

## 2021-09-20 NOTE — Progress Notes (Signed)
Chief Complaint  ?Patient presents with  ? Knee Pain  ?  RT/ hx of torn meniscus  ?1 yr follow up  ? ?Encounter Diagnoses  ?Name Primary?  ? Chronic pain of right knee Yes  ? S/P right knee arthroscopy 2018   ? Chondromalacia patellae of right knee   ? 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 ?46 year old female complains of continued pain in the right knee she had arthroscopy of the right knee back in 2018 she had chondromalacia in several areas and meniscal cystic structure with normal lateral meniscus ? ?She has frequent episodes with the knee gives out ? ?She does wear a brace for most of her activities ? ?She is on meloxicam 15 mg in divided dose twice a day 7.5 mg each time ? ?Her exam shows a stable drawer test tenderness medial lateral joint line no effusion still maintains excellent range of motion ? ?Assessment ? ?46 year old female osteoarthritis chondromalacia right knee old ACL tear with scarred to the PCL with a negative Lachman and pivot shift test under anesthesia ? ?Recommend she continue meloxicam and gabapentin ? ?Inject the knee when needed wear the brace I added some quadriceps exercises ? ?She would like to follow-up in 3 months for another injection instead of 1 year. ?Procedure note right knee injection  ? ?verbal consent was obtained to inject right knee joint ? ?Timeout was completed to confirm the site of injection ? ?The medications used were depomedrol 40 mg and 1% lidocaine 3 cc ?Anesthesia was provided by ethyl chloride and the skin was prepped with alcohol. ? ?After cleaning the skin with alcohol a 20-gauge needle was used to inject the right knee joint. There were no complications. A sterile bandage was applied.  ?

## 2021-09-20 NOTE — Patient Instructions (Addendum)
You have received an injection of steroids into the joint. 15% of patients will have increased pain within the 24 hours postinjection.  ? ?This is transient and will go away.  ? ?We recommend that you use ice packs on the injection site for 20 minutes every 2 hours and extra strength Tylenol 2 tablets every 8 as needed until the pain resolves. ? ?If you continue to have pain after taking the Tylenol and using the ice please call the office for further instructions. ? ?Wear the brace for all activity ? ?Exercises daily ? ?Continue medications that you are on  ?

## 2021-11-20 ENCOUNTER — Other Ambulatory Visit: Payer: Self-pay | Admitting: Orthopedic Surgery

## 2021-11-26 ENCOUNTER — Ambulatory Visit: Payer: Medicaid Other | Admitting: Podiatry

## 2021-11-26 ENCOUNTER — Ambulatory Visit (INDEPENDENT_AMBULATORY_CARE_PROVIDER_SITE_OTHER): Payer: Medicaid Other | Admitting: Podiatry

## 2021-11-26 ENCOUNTER — Ambulatory Visit (INDEPENDENT_AMBULATORY_CARE_PROVIDER_SITE_OTHER): Payer: Medicaid Other

## 2021-11-26 DIAGNOSIS — M779 Enthesopathy, unspecified: Secondary | ICD-10-CM

## 2021-11-26 DIAGNOSIS — M7752 Other enthesopathy of left foot: Secondary | ICD-10-CM

## 2021-11-26 DIAGNOSIS — L603 Nail dystrophy: Secondary | ICD-10-CM

## 2021-11-26 NOTE — Progress Notes (Signed)
HPI: 46 y.o. female presenting today for evaluation of a symptomatic thickened dystrophic nail to the left hallux nail plate.  Approximately 1 year ago we did perform total temporary nail avulsion to the right hallux nail plate for the same condition.  The nail plate has grown back and it is improved.  She would like to have the left hallux nail plate removed today  Patient also states that she has been experiencing some pain and tenderness to the left great toe joint which began after a wedding when she was on her feet the entire day.  She presents for further treatment and evaluation  Past Medical History:  Diagnosis Date   Anxiety    Arthritis    Asthma due to environmental allergies    pt states she has never had asthma problems    Depression    DVT (deep venous thrombosis) (HCC)    post c-section   Migraine    PE (pulmonary embolism)    post c-section   Seizures ([place])    as child from severe ear infections- no meds now and no seizures since age 37   Warfarin anticoagulation    took from 03/2010-12/2010.     Past Surgical History:  Procedure Laterality Date   CESAREAN SECTION     CHOLECYSTECTOMY N/A 12/21/2012   Procedure: LAPAROSCOPIC CHOLECYSTECTOMY;  Surgeon: Donato Heinz, MD;  Location: [facility];  Service: General;  Laterality: N/A;   KNEE ARTHROSCOPY WITH MEDIAL MENISECTOMY Right 03/05/2017   Procedure: chondroplasty patella, medial femoral condyle;  Surgeon: Carole Civil, MD;  Location: [facility];  Service: Orthopedics;  Laterality: Right;    Allergies  Allergen Reactions   Amoxicillin Hives   Penicillins Rash     Physical Exam: General: The patient is alert and oriented x3 in no acute distress.  Dermatology: Hyperkeratotic dystrophic discolored nail noted to the left hallux nail plate  Vascular: Palpable pedal pulses bilaterally. Capillary refill within normal limits.  Negative for any significant edema or erythema  Neurological: Light touch and protective  threshold grossly intact  Musculoskeletal Exam: No pedal deformities noted.  There is some slight tenderness palpation range of motion of the first MTP left with a very mild hallux valgus deformity noted  Radiographic Exam:  Normal osseous mineralization. Joint spaces preserved. No fracture/dislocation/boney destruction.    Assessment: 1.  Symptomatic dystrophic nail left hallux nail plate 2.  Mild hallux valgus with first MTP capsulitis left   Plan of Care:  1. Patient evaluated. X-Rays reviewed.  2.  The patient had good success with total temporary nail avulsion to the right hallux nail plate.  She would like to have the same performed to the left.  I agree.  The left toe was prepped in aseptic manner and digital block performed using 3 mL of 2% lidocaine plain.  The nail was avulsed in its entirety and dressings applied.  Post care instructions provided. 3.  In regards to the great toe joint pain, we will simply observe for now.  Recommend OTC NSAIDs as needed.  Recommend good supportive shoes and sneakers 4.  Return to clinic as needed       Edrick Kins, DPM [facility]  Dr. Edrick Kins, DPM    [address]  [place], [place] [address]                Office [phone]  Fax [phone]     

## 2021-11-26 NOTE — Patient Instructions (Signed)

## 2021-12-12 LAB — COLOGUARD: COLOGUARD: NEGATIVE

## 2021-12-20 ENCOUNTER — Ambulatory Visit (INDEPENDENT_AMBULATORY_CARE_PROVIDER_SITE_OTHER): Payer: Medicaid Other | Admitting: Orthopedic Surgery

## 2021-12-20 DIAGNOSIS — G8929 Other chronic pain: Secondary | ICD-10-CM

## 2021-12-20 DIAGNOSIS — M25562 Pain in left knee: Secondary | ICD-10-CM | POA: Diagnosis not present

## 2021-12-20 DIAGNOSIS — M1710 Unilateral primary osteoarthritis, unspecified knee: Secondary | ICD-10-CM

## 2021-12-20 DIAGNOSIS — M25561 Pain in right knee: Secondary | ICD-10-CM | POA: Diagnosis not present

## 2021-12-20 NOTE — Patient Instructions (Signed)
You have received an injection of steroids into the joint. 15% of patients will have increased pain within the 24 hours postinjection.   This is transient and will go away.   We recommend that you use ice packs on the injection site for 20 minutes every 2 hours and extra strength Tylenol 2 tablets every 8 as needed until the pain resolves.  If you continue to have pain after taking the Tylenol and using the ice please call the office for further instructions.

## 2021-12-20 NOTE — Progress Notes (Signed)
Chief Complaint  Patient presents with   Knee Pain    Bilateral/ wants injections x 2   Injections    Bilateral knees   I  INJ REQUESTED   Procedure note for bilateral knee injections  Procedure note left knee injection verbal consent was obtained to inject left knee joint  Timeout was completed to confirm the site of injection  The medications used were 40 mg depomedrol and 3 cc of 1% lidocaine  Anesthesia was provided by ethyl chloride and the skin was prepped with alcohol.  After cleaning the skin with alcohol a 20-gauge needle was used to inject the left knee joint. There were no complications. A sterile bandage was applied.   Procedure note right knee injection verbal consent was obtained to inject right knee joint  Timeout was completed to confirm the site of injection  The medications used were 40 mg depomedrol and 3 cc of 1% lidocaine  Anesthesia was provided by ethyl chloride and the skin was prepped with alcohol.  After cleaning the skin with alcohol a 20-gauge needle was used to inject the right knee joint. There were no complications. A sterile bandage was applied.   Encounter Diagnoses  Name Primary?   Chronic pain of right knee Yes   Chronic pain of left knee    Primary localized osteoarthritis of knee-LEFT

## 2022-01-01 ENCOUNTER — Other Ambulatory Visit: Payer: Self-pay | Admitting: Orthopedic Surgery

## 2022-01-29 ENCOUNTER — Other Ambulatory Visit: Payer: Self-pay | Admitting: Orthopedic Surgery

## 2022-03-13 ENCOUNTER — Other Ambulatory Visit: Payer: Self-pay | Admitting: Orthopedic Surgery

## 2022-03-25 ENCOUNTER — Ambulatory Visit (INDEPENDENT_AMBULATORY_CARE_PROVIDER_SITE_OTHER): Payer: Medicaid Other | Admitting: Orthopedic Surgery

## 2022-03-25 ENCOUNTER — Encounter: Payer: Self-pay | Admitting: Orthopedic Surgery

## 2022-03-25 DIAGNOSIS — M25562 Pain in left knee: Secondary | ICD-10-CM | POA: Diagnosis not present

## 2022-03-25 DIAGNOSIS — G8929 Other chronic pain: Secondary | ICD-10-CM

## 2022-03-25 DIAGNOSIS — M25561 Pain in right knee: Secondary | ICD-10-CM | POA: Diagnosis not present

## 2022-03-25 DIAGNOSIS — Z9889 Other specified postprocedural states: Secondary | ICD-10-CM

## 2022-03-25 DIAGNOSIS — M94261 Chondromalacia, right knee: Secondary | ICD-10-CM

## 2022-03-25 DIAGNOSIS — M1710 Unilateral primary osteoarthritis, unspecified knee: Secondary | ICD-10-CM

## 2022-03-25 DIAGNOSIS — M2241 Chondromalacia patellae, right knee: Secondary | ICD-10-CM

## 2022-03-25 MED ORDER — METHYLPREDNISOLONE ACETATE 40 MG/ML IJ SUSP
40.0000 mg | Freq: Once | INTRAMUSCULAR | Status: AC
  Administered 2022-03-25: 40 mg via INTRA_ARTICULAR

## 2022-03-25 NOTE — Progress Notes (Unsigned)
Chief Complaint  Patient presents with   Knee Pain    3 mth follow up Bilateral knee/bilat injection   Encounter Diagnoses  Name Primary?   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  After cleaning the skin with alcohol a 20-gauge needle was used to inject the left knee joint. There were no complications. A sterile bandage was applied.   Procedure note right knee injection verbal consent was obtained to inject right knee joint  Timeout was completed to confirm the site of injection  The medications used were 40 mg depomedrol and 3 cc of 1% lidocaine  Anesthesia was provided by ethyl chloride and the skin was prepped with alcohol.  After cleaning the skin with alcohol a 20-gauge needle was used to inject the right knee joint. There were no complications. A sterile bandage was applied.

## 2022-04-02 ENCOUNTER — Other Ambulatory Visit: Payer: Self-pay | Admitting: Orthopedic Surgery

## 2022-05-02 ENCOUNTER — Other Ambulatory Visit: Payer: Self-pay | Admitting: Orthopedic Surgery

## 2022-06-19 DIAGNOSIS — I639 Cerebral infarction, unspecified: Secondary | ICD-10-CM | POA: Insufficient documentation

## 2022-06-19 DIAGNOSIS — Z86711 Personal history of pulmonary embolism: Secondary | ICD-10-CM | POA: Insufficient documentation

## 2022-06-21 ENCOUNTER — Telehealth: Payer: Self-pay | Admitting: Orthopedic Surgery

## 2022-06-21 NOTE — Telephone Encounter (Signed)
Patient had a stroke on 06/19/22 and was given TPA to reverse the stroke.  She went to [facility] in [place].     She is wanting to know if she can still get her INJECTION?   Please call her back at [phone]

## 2022-06-24 ENCOUNTER — Ambulatory Visit: Payer: Medicaid Other | Admitting: Orthopedic Surgery

## 2022-06-25 ENCOUNTER — Other Ambulatory Visit: Payer: Self-pay | Admitting: Orthopedic Surgery

## 2022-07-02 ENCOUNTER — Ambulatory Visit: Payer: Medicaid Other | Admitting: Neurology

## 2022-07-02 ENCOUNTER — Telehealth: Payer: Self-pay | Admitting: Neurology

## 2022-07-02 ENCOUNTER — Encounter: Payer: Self-pay | Admitting: Neurology

## 2022-07-02 VITALS — BP 122/79 | HR 63 | Ht 69.0 in | Wt 222.2 lb

## 2022-07-02 DIAGNOSIS — R519 Headache, unspecified: Secondary | ICD-10-CM | POA: Diagnosis not present

## 2022-07-02 DIAGNOSIS — R0683 Snoring: Secondary | ICD-10-CM | POA: Diagnosis not present

## 2022-07-02 DIAGNOSIS — D6859 Other primary thrombophilia: Secondary | ICD-10-CM

## 2022-07-02 DIAGNOSIS — Z86711 Personal history of pulmonary embolism: Secondary | ICD-10-CM

## 2022-07-02 DIAGNOSIS — K148 Other diseases of tongue: Secondary | ICD-10-CM

## 2022-07-02 DIAGNOSIS — R29810 Facial weakness: Secondary | ICD-10-CM

## 2022-07-02 DIAGNOSIS — G459 Transient cerebral ischemic attack, unspecified: Secondary | ICD-10-CM | POA: Diagnosis not present

## 2022-07-02 DIAGNOSIS — R200 Anesthesia of skin: Secondary | ICD-10-CM

## 2022-07-02 NOTE — Patient Instructions (Addendum)
Snoring is terrible, partner will not sleep in the same room, he even recorded it, wakes with morning headache and dry mouth: Sleep test, recent TIA in the hospital 12/14. Dr Frances Furbish  wearing a 14-day monitor. If monitor is negative loop recorder.   Bloodwork for genetic causes of blood clots  Second opinion to Dr. Pearlean Brownie who Is head of [facility] for second opinion  Differential included TIA vs Migraine with aura  MRI of the brain w/wo contrast  Transient Ischemic Attack A transient ischemic attack (TIA) happens when blood supply to the brain is blocked temporarily. A TIA causes stroke-like symptoms that go away quickly without causing any permanent damage. Having a TIA can be considered a warning sign for a stroke and should not be ignored. A person who has a TIA is at higher risk for a stroke. What are the causes? This condition is caused by a temporary blockage in an artery in the head or neck. This means the brain does not get the blood supply it needs. A blockage can be caused by: Fatty buildup in an artery in the head or neck (atherosclerosis). A blood clot traveling from the heart. An artery tear (dissection). Inflammation of an artery (vasculitis). Sometimes the cause is not known. What increases the risk? Certain factors make you more likely to develop this condition. Some of these are things you can change, including: Using products that contain nicotine or tobacco. Being inactive. Heavy alcohol use. Drug use, especially cocaine and methamphetamine. Medical conditions that may increase your risk include: High blood pressure (hypertension). High cholesterol. Diabetes. Heart disease (coronary artery disease). An irregular heartbeat, also called atrial fibrillation (AFib). Sickle cell disease. Blood clotting disorders (hypercoagulable state). Other risk factors include: Being over the age of 51. Being female. Obesity. Sleep problems such as sleep apnea. Family  history of stroke. Previous history of blood clots, stroke, TIA, or heart attack. What are the signs or symptoms? Symptoms of a TIA are the same as those of a stroke. The symptoms develop suddenly, and then go away quickly. They may include: Dizziness, loss of balance and coordination, or trouble walking. Vision changes, such as double vision, blurred vision, or loss of vision. Weakness or numbness in your face, arm, or leg, especially on one side of your body. Trouble speaking, understanding speech, or both (aphasia). Nausea and vomiting. Severe headache. Confusion. If possible, note what time your symptoms started. Tell your health care provider. How is this diagnosed? This condition may be diagnosed based on: Your symptoms and medical history. A physical exam. Imaging tests, usually a CT scan or MRI of the brain. Blood tests. You may also have other tests, including: Electrocardiogram (ECG). Echocardiogram. Continuous heart monitoring. Carotid ultrasound. A scan of blood circulation in the brain (CT angiogram or MR angiogram). How is this treated? The goal of treatment is to reduce the risk for a stroke. Stroke prevention therapies may include: Changes to diet and lifestyle, such as being physically active and stopping smoking. Treating other health conditions, such as diabetes or AFib. Medicines to thin the blood (antiplatelets or anticoagulants). Blood pressure medicines. Medicines to reduce cholesterol. If testing shows a narrowing in the arteries to your brain, your health care provider may recommend a procedure, such as: Carotid endarterectomy. This is done to remove the blockage from your artery. Carotid angioplasty and stenting. This uses a small mesh tube (stent) to open or widen an artery in the neck. The stent helps keep the artery open by  supporting the artery walls. Follow these instructions at home: Medicines Take over-the-counter and prescription medicines only as  told by your health care provider. If you were told to take a medicine to thin your blood, such as aspirin or an anticoagulant, use it exactly as told by your health care provider. Taking too much blood-thinning medicine can cause bleeding. Taking too little will not protect you against a stroke and other problems. Eating and drinking  Eat 5 or more servings of fruits and vegetables each day. Follow guidelines from your health care provider about your diet. You may need to follow a certain diet to help manage risk factors for stroke. This may include: Eating a low-fat, low-salt diet. Choosing high-fiber foods. Limiting carbohydrates and sugar. If you drink alcohol: Limit how much you have to: 0-1 drink a day for women who are not pregnant. 0-2 drinks a day for men. Know how much alcohol is in your drink. In the [place] one drink equals one 12 oz bottle of beer (355 mL), one 5 oz glass of wine (148 mL), or one 1 oz glass of hard liquor (44 mL). General instructions Maintain a healthy weight. Try to get at least 30 minutes of exercise on most days. Get treatment if you have sleep apnea. Do not use any products that contain nicotine or tobacco. These products include cigarettes, chewing tobacco, and vaping devices, such as e-cigarettes. If you need help quitting, ask your health care provider. Do not use illegal drugs. Keep all follow-up visits. Your health care provider will want to know if you have any more symptoms and to check blood labs if any medicines were prescribed. Where to find more information American Stroke Association: stroke.org Get help right away if: You have chest pain. You have fast or irregular heartbeats (palpitations). You have any symptoms of a stroke. "BE FAST" is an easy way to remember the main warning signs of a stroke. B - Balance. Signs are dizziness, sudden trouble walking, or loss of balance. E - Eyes. Signs are trouble seeing or a sudden change in vision. F  - Face. Signs are sudden weakness or numbness of the face, or the face or eyelid drooping on one side. A - Arms. Signs are weakness or numbness in an arm. This happens suddenly and usually on one side of the body. S - Speech.Signs are sudden trouble speaking, slurred speech, or trouble understanding what people say. T - Time. Time to call emergency services. Write down what time symptoms started. You have other signs of a stroke, such as: A sudden, severe headache with no known cause. Nausea or vomiting. Seizure. These symptoms may be an emergency. Get help right away. Call 911. Do not wait to see if the symptoms will go away. Do not drive yourself to the hospital. This information is not intended to replace advice given to you by your health care provider. Make sure you discuss any questions you have with your health care provider. Document Revised: 12/07/2021 Document Reviewed: 12/07/2021 Elsevier Patient Education  2023 [employer]  Sleep Apnea Sleep apnea is a condition in which breathing pauses or becomes shallow during sleep. People with sleep apnea usually snore loudly. They may have times when they gasp and stop breathing for 10 seconds or more during sleep. This may happen many times during the night. Sleep apnea disrupts your sleep and keeps your body from getting the rest that it needs. This condition can increase your risk of certain health problems, including: Heart  attack. Stroke. Obesity. Type 2 diabetes. Heart failure. Irregular heartbeat. High blood pressure. The goal of treatment is to help you breathe normally again. What are the causes?  The most common cause of sleep apnea is a collapsed or blocked airway. There are three kinds of sleep apnea: Obstructive sleep apnea. This kind is caused by a blocked or collapsed airway. Central sleep apnea. This kind happens when the part of the brain that controls breathing does not send the correct signals to the muscles that  control breathing. Mixed sleep apnea. This is a combination of obstructive and central sleep apnea. What increases the risk? You are more likely to develop this condition if you: Are overweight. Smoke. Have a smaller than normal airway. Are older. Are female. Drink alcohol. Take sedatives or tranquilizers. Have a family history of sleep apnea. Have a tongue or tonsils that are larger than normal. What are the signs or symptoms? Symptoms of this condition include: Trouble staying asleep. Loud snoring. Morning headaches. Waking up gasping. Dry mouth or sore throat in the morning. Daytime sleepiness and tiredness. If you have daytime fatigue because of sleep apnea, you may be more likely to have: Trouble concentrating. Forgetfulness. Irritability or mood swings. Personality changes. Feelings of depression. Sexual dysfunction. This may include loss of interest if you are female, or erectile dysfunction if you are female. How is this diagnosed? This condition may be diagnosed with: A medical history. A physical exam. A series of tests that are done while you are sleeping (sleep study). These tests are usually done in a sleep lab, but they may also be done at home. How is this treated? Treatment for this condition aims to restore normal breathing and to ease symptoms during sleep. It may involve managing health issues that can affect breathing, such as high blood pressure or obesity. Treatment may include: Sleeping on your side. Using a decongestant if you have nasal congestion. Avoiding the use of depressants, including alcohol, sedatives, and narcotics. Losing weight if you are overweight. Making changes to your diet. Quitting smoking. Using a device to open your airway while you sleep, such as: An oral appliance. This is a custom-made mouthpiece that shifts your lower jaw forward. A continuous positive airway pressure (CPAP) device. This device blows air through a mask when you  breathe out (exhale). A nasal expiratory positive airway pressure (EPAP) device. This device has valves that you put into each nostril. A bi-level positive airway pressure (BIPAP) device. This device blows air through a mask when you breathe in (inhale) and breathe out (exhale). Having surgery if other treatments do not work. During surgery, excess tissue is removed to create a wider airway. Follow these instructions at home: Lifestyle Make any lifestyle changes that your health care provider recommends. Eat a healthy, well-balanced diet. Take steps to lose weight if you are overweight. Avoid using depressants, including alcohol, sedatives, and narcotics. Do not use any products that contain nicotine or tobacco. These products include cigarettes, chewing tobacco, and vaping devices, such as e-cigarettes. If you need help quitting, ask your health care provider. General instructions Take over-the-counter and prescription medicines only as told by your health care provider. If you were given a device to open your airway while you sleep, use it only as told by your health care provider. If you are having surgery, make sure to tell your health care provider you have sleep apnea. You may need to bring your device with you. Keep all follow-up visits. This is important.  Contact a health care provider if: The device that you received to open your airway during sleep is uncomfortable or does not seem to be working. Your symptoms do not improve. Your symptoms get worse. Get help right away if: You develop: Chest pain. Shortness of breath. Discomfort in your back, arms, or stomach. You have: Trouble speaking. Weakness on one side of your body. Drooping in your face. These symptoms may represent a serious problem that is an emergency. Do not wait to see if the symptoms will go away. Get medical help right away. Call your local emergency services (911 in the [place] Do not drive yourself to the  hospital. Summary Sleep apnea is a condition in which breathing pauses or becomes shallow during sleep. The most common cause is a collapsed or blocked airway. The goal of treatment is to restore normal breathing and to ease symptoms during sleep. This information is not intended to replace advice given to you by your health care provider. Make sure you discuss any questions you have with your health care provider. Document Revised: 01/31/2021 Document Reviewed: 06/02/2020 Elsevier Patient Education  2023 [employer] Implantable Loop Recorder Placement  An implantable loop recorder is a small electronic device that is placed under the skin of the chest. The device records the electrical activity of the heart over a long period of time. Your health care provider can download these recordings to monitor your heart. You may need an implantable loop recorder if you have periods of abnormal heart activity (arrhythmias) or unexplained fainting (syncope). The recorder can be left in place for as long as 3 years. Tell a health care provider about: Any allergies you have. All medicines you are taking, including vitamins, herbs, eye drops, creams, and over-the-counter medicines. Any problems you or family members have had with anesthesia. Any bleeding problems you have. Any surgeries you have had. Any medical conditions you have. Whether you are pregnant or may be pregnant. What are the risks? Your health care provider will talk with you about risks. These may include: Infection. Bleeding. Allergic reactions to anesthesia. Damage to nerves or blood vessels. Failure of the device to work. This could require another surgery to replace it. What happens before the procedure? You may have a physical exam, blood tests, and imaging tests, such as a chest X-ray. Follow instructions from your health care provider about what you may eat and drink Ask your health care provider about: Changing or  stopping your regular medicines. These include any diabetes medicines or blood thinners you take. Taking medicines such as aspirin and ibuprofen. These medicines can thin your blood. Do not take them unless your health care provider tells you to. Taking over-the-counter medicines, vitamins, herbs, and supplements. Ask your health care provider: How your surgery site will be marked. What steps will be taken to help prevent infection. These steps may include: Removing hair at the surgery site. Washing skin with a soap that kills germs. Taking antibiotics. If you will be going home right after the procedure, plan to have a responsible adult: Take you home from the hospital or clinic. You will not be allowed to drive. Do not use any products that contain nicotine or tobacco before the procedure. These products include cigarettes, chewing tobacco, and vaping devices, such as e-cigarettes. If you need help quitting, ask your health care provider What happens during the procedure? An IV will be inserted into one of your veins. You may be given: A sedative. This helps you relax  Anesthesia. This will: Numb certain areas of your body. Make you fall asleep for surgery. A small incision will be made on the left side of your upper chest. A pocket will be created under your skin. The device will be placed in the pocket. The incision will be closed with stitches (sutures) or adhesive strips. A bandage (dressing) will be placed over the incision. The procedure may vary among health care providers and hospitals. What happens after the procedure? Your blood pressure, heart rate, breathing rate, and blood oxygen level will be monitored until you leave the hospital or clinic. You may be able to go home on the day of your surgery. Before you go home: Your health care provider will program your recorder. You will learn how to trigger your device with a handheld activator. You will learn how to send  recordings to your health care provider. You will get an ID card for your device, and you will be told when to use it. This information is not intended to replace advice given to you by your health care provider. Make sure you discuss any questions you have with your health care provider. Document Revised: 11/19/2021 Document Reviewed: 11/19/2021 Elsevier Patient Education  2023 [employer].

## 2022-07-02 NOTE — Progress Notes (Signed)
[facility]    Provider:  Dr Jaynee Eagles Requesting Provider: No ref. provider found Primary Care Provider:  No primary care provider on file.  CC:  Numbness  07/02/2022: We saw patient in 2021 for numbness. We completed an entire stroke workup. MRI brain and c-spine,emg/ncs, echo, CTA H&N, TCD in 2021 wasnormal an right-sided. 05/22/2022 She was at work and left cheek went numb, she had a facial droop, [facility], she had Procedures:CT head, Echocardiogram,MRI brain,CTA head and neck with contrast, CT head without contrast. She had a very bad headaches. She has headaches, she has a hx of migraines, she had a sharp pain on the left side of the head, followed by a severe headache for days, left side, light and sound sensitivity. They gave her TPA. Tobgue deviation, She wa still having facial droop during the imaging and tongue to the right lasted for 24 hours, arm and left le weak, she was not on aspirin, nothing since 2021. No palpitations. She is wearing a 14-day heart monitor. Snoring is terrible, partner will not sleep in the same room, he even recorded it, wakes with morning headache and dry mouth. She was having a difficult time talking, sounds like dysarthri and not aphasia. Everything is resolved. No other focal neurologic deficits, associated symptoms, inciting events or modifiable factors.   Patient complains of symptoms per HPI as well as the following symptoms: TIA . Pertinent negatives and positives per HPI. All others negative   HPI 05/16/2020:  Jaime Massey is a 46 y.o. female here as requested by No ref. provider found for "Numbness and tingling in arms and legs needs NCV".  Past medical history migraine with aura, anxiety, obesity, asthma, chronic left knee pain, depression, daily persistent headache onset August 17, 2016 and fever of unknown origin, history of pulmonary embolus in 2011 6 weeks status post C-section, tobacco abuse, prior warfarin use.  I  reviewed Dr. Coolidge Breeze examination which showed normal general, HEENT, neck, heart, lungs, abdomen, extremities, skin, neuro and psych examinations.  I reviewed Dr. Roland Rack notes: Patient reports tingling on the right side, a few weeks prior she had hip pain and popping, reports hip pain during appointment, has numbness in her right leg and right arm, she is also having headaches which are chronic, blood pressure was good today, has not had her eyes checked this year, she reports being at home since June 2020, feels like she is always stressed, more so with her grown children who are 24, 22 and 17, she does report a lot of stress.  Patient is here alone and she reports numbness, in August 2021 she said her right side went numb at the end of vacation, she saw her doctor and she started gabapentin, does help some but she still has the numbness and feels it is in her hands and foot, it is not the sharp tingling as it was before, she also has chronic headaches.In August they went on vacation, no significant exercise, she took a shower one evening and her right foot was swollen like a balloon, she went to bed and whole right side was numb, face, arm and leg, she drove home and thought she was going to have to pull over. She was dizzy, no known facial droop, no vision changes or talking problems, it never resolved, Gabapentin may help with the symptoms its a sharp tingling but her hand and right foot are still numb. Acute onset. When her right foot was swollen the  lower leg sore. Symptoms are in the entire hand on the right, right foot feels different. The right side of the face was definitely involved initially. 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   Reviewed notes, labs and imaging from outside physicians, which showed:  MRI of the brain wo contrast: reviewed report no acute intracranial process  04/12/2020: CBC and CMP  unremarkable, BUN 9,  Creat 0.69, hgba1c 5.2 02/23/2020: Hgba1c 5.2, TSH 0.831  Review of Systems: Patient complains of symptoms per HPI as well as the following symptoms: Blurred vision, feeling hot, feeling cold, increased thirst, snoring, swelling in legs, fatigue, ringing in ears, constipation, joint pain, joint swelling, cramps, aching muscles, headache, numbness, weakness, dizziness, sleepiness, restless legs, depression, anxiety, too much sleep, not in of sleep, disinterest in activity and decreased energy. Pertinent negatives and positives per HPI. All others negative.   Social History   Socioeconomic History   Marital status: Divorced    Spouse name: Not on file   Number of children: 4   Years of education: Not on file   Highest education level: Some college, no degree  Occupational History   Not on file  Tobacco Use   Smoking status: Former    Packs/day: 0.50    Years: 26.00    Total pack years: 13.00    Types: Cigarettes    Quit date: 10/2017    Years since quitting: 4.7   Smokeless tobacco: Never  Vaping Use   Vaping Use: Never used  Substance and Sexual Activity   Alcohol use: Yes    Comment: reports occasional beer but very seldom   Drug use: No   Sexual activity: Yes    Birth control/protection: I.U.D.  Other Topics Concern   Not on file  Social History Narrative   Lives at home with son and boyfriend   Right handed   Caffeine: soda daily   Social Determinants of Health   Financial Resource Strain: Not on file  Food Insecurity: Not on file  Transportation Needs: Not on file  Physical Activity: Not on file  Stress: Not on file  Social Connections: Not on file  Intimate Partner Violence: Not on file    Family History  Problem Relation Age of Onset   Breast cancer Maternal Grandmother    Skin cancer Maternal Grandmother    Cancer Maternal Grandfather    Diabetes Paternal Grandmother    Heart disease Paternal Grandfather    Breast cancer Mother    Aortic aneurysm  Father    Stroke Neg Hx        none aware of    Neuropathy Neg Hx        none aware of     Past Medical History:  Diagnosis Date   Anxiety    Arthritis    Asthma due to environmental allergies    pt states she has never had asthma problems    Depression    DVT (deep venous thrombosis) (HCC)    post c-section   Migraine    PE (pulmonary embolism)    post c-section   Seizures (HCC)    as child from severe ear infections- no meds now and no seizures since age 39   Warfarin anticoagulation    took from 03/2010-12/2010.     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    Chondromalacia patellae of right knee    Rupture of anterior cruciate ligament of right knee    Cyst of anterior horn of lateral meniscus of right knee    Adjustment disorder with anxious mood 02/15/2017   Bandemia without diagnosis of specific infection 02/15/2017   Pulmonary embolus ([place]) 02/15/2017   FUO (fever of unknown origin) 02/15/2017   New daily persistent headache 02/15/2017   Arthritis of knee, right 12/26/2016   Bone spur 12/03/2016   Fever of unknown origin 10/01/2016    Past Surgical History:  Procedure Laterality Date   CESAREAN SECTION     CHOLECYSTECTOMY N/A 12/21/2012   Procedure: LAPAROSCOPIC CHOLECYSTECTOMY;  Surgeon: Donato Heinz, MD;  Location: [facility];  Service: General;  Laterality: N/A;   KNEE ARTHROSCOPY WITH MEDIAL MENISECTOMY Right 03/05/2017   Procedure: chondroplasty patella, medial femoral condyle;  Surgeon: Carole Civil, MD;  Location: [facility];  Service: Orthopedics;   Laterality: Right;    Current Outpatient Medications  Medication Sig Dispense Refill   acetaminophen (TYLENOL) 500 MG tablet Take 1,500 mg by mouth in the morning and at bedtime.     acetaminophen-codeine (TYLENOL #3) 300-30 MG tablet Take 1 tablet by mouth every 6 (six) hours as needed for moderate pain. 30 tablet 0   aspirin EC 81 MG tablet Take 1 tablet (81 mg total) by mouth daily. Swallow whole. 30 tablet 11   atorvastatin (LIPITOR) 40 MG tablet Take 40 mg by mouth daily.     buPROPion (WELLBUTRIN XL) 150 MG 24 hr tablet Take 150 mg by mouth daily.     busPIRone (BUSPAR) 10 MG tablet Take 10 mg by mouth 2 (two) times daily. 10 mg AM 5 mg PM     gabapentin (NEURONTIN) 100 MG capsule Take 300 mg by mouth at bedtime.     lamoTRIgine 50 MG TBDP Take 50 mg by mouth daily.     levonorgestrel (MIRENA, 52 MG,) 20 MCG/24HR IUD 1 each by Intrauterine route once.     meloxicam (MOBIC) 7.5 MG tablet TAKE 1 TABLET BY MOUTH IN THE MORNING AND AT BEDTIME 60 tablet 0   omeprazole (PRILOSEC) 20 MG capsule Take 20 mg by mouth daily.     predniSONE (DELTASONE) 10 MG tablet TAKE 1 TABLET BY MOUTH ONCE DAILY AS NEEDED IF  SWELLING  IN  RIGHT  KNEE  NOT  IMPROVED  WITH  IBUPROFEN 60 tablet 0   traZODone (DESYREL) 50 MG tablet Take 50 mg by mouth at bedtime.     No current facility-administered medications for this visit.   Facility-Administered Medications Ordered in Other Visits  Medication Dose Route Frequency Provider Last Rate Last Admin   sodium chloride flush (NS) 0.9 % injection 16 mL  16 mL Intracatheter PRN Melvenia Beam, MD        Allergies as of 07/02/2022 - Review Complete 07/02/2022  Allergen Reaction Noted   Amoxicillin Hives 02/03/2017   Penicillins Rash 06/25/2014    Vitals: BP 122/79   Pulse 63   Ht 5\' 9"  (1.753 m)   Wt 222 lb 3.2 oz (100.8 kg)   BMI 32.81 kg/m  Last Weight:  Wt Readings from Last 1 Encounters:  07/02/22 222 lb 3.2 oz (100.8 kg)   Last Height:   Ht  Readings from Last 1 Encounters:  07/02/22 5\' 9"  (1.753 m)    Physical exam: Exam: Gen: NAD, conversant, well nourised, obese, well groomed                     CV: RRR, no MRG. No Carotid Bruits. No peripheral edema, warm, nontender Eyes: Conjunctivae clear without exudates or hemorrhage  Neuro: Detailed Neurologic Exam  Speech:    Speech is normal; fluent and spontaneous with normal comprehension.  Cognition:    The patient is oriented to person, place, and time;     recent and remote memory intact;     language fluent;     normal attention, concentration,     fund of knowledge Cranial Nerves:    The pupils are equal, round, and reactive to light. The fundi are normal and spontaneous venous pulsations are present. Visual fields are full to finger confrontation. Extraocular movements are intact. Trigeminal sensation is intact and the muscles of mastication are normal. The face is symmetric. The palate elevates in the midline. Hearing intact. Voice is normal. Shoulder shrug is normal. The tongue has normal motion without fasciculations.   Coordination: nml  Gait: nml  Motor Observation:    No asymmetry, no atrophy, and no involuntary movements noted. Tone:    Normal muscle tone.    Posture:    Posture is normal. normal erect    Strength:    Strength is V/V in the upper and lower limbs.      Sensation: intact to LT     Reflex Exam:  DTR's:    Deep tendon reflexes in the upper and lower extremities are normal bilaterally.   Toes:    The toes are downgoing bilaterally.   Clonus:    Clonus is absent.     Assessment/Plan:   4We saw patient in 2021 for numbness. We completed an entire stroke workup. MRI brain and c-spine,emg/ncs, echo, CTA H&N, TCD in 2021 wasnormal an right-sided. 05/22/2022 She was at work and left cheek went numb, she had a facial droop, [facility], she had Procedures:CT head, Echocardiogram,MRI brain,CTA head and neck with contrast, CT  head without contrast. All negative.  She had a very bad headaches. She has headaches, she has a hx of migraines, she had a sharp pain on the left side of the head, followed by a severe headache for days, left side, light and sound sensitivity. They gave her TPA. Tobgue deviation, She wa still having facial droop during the imaging and tongue to the right lasted for 24 hours, arm and left le weak, she was not on aspirin, nothing since 2021. No palpitations. She is wearing a 14-day heart monitor. Snoring is terrible, partner will not sleep in the same room, he even recorded it, wakes with morning headache and dry mouth. She was having a difficult time talking, sounds like dysarthri and not aphasia. Everything is resolved. No other focal neurologic deficits, associated symptoms, inciting events or modifiable factors.  Past medical history migraine with aura, anxiety, obesity, asthma, chronic left knee pain, depression, daily persistent headache onset August 17, 2016 and fever of unknown origin, history of pulmonary embolus in 2011 6 weeks status post C-section, tobacco abuse, prior warfarin use.  Patient had acute onset left face, left arm and left leg numbness, facial droop, tongue deviation. Thorough workup x  2 (happened in 2021 right side) negative. Will complete any other testing that was never done and get second opinion from vascular neurology   Snoring is terrible, partner will not sleep in the same room, he even recorded it, wakes with morning headache and dry mouth: Sleep test, recent TIA in the hospital 12/14. Dr Rexene Alberts  wearing a 14-day monitor. If monitor is negative loop recorder. She will have to let us know, discussed.   Bloodwork for genetic causes of blood clots  Second opinion to Dr. Leonie Man who Is head of [facility] for second opinion  Differential included TIA vs Migraine with aura  MRI of the brain w/wo contrast  I had a long d/w patient about her recent tis, risk for  recurrent stroke/TIAs, personally independently reviewed imaging studies and stroke evaluation results and answered questions.Continue asa for secondary stroke prevention and maintain strict control of hypertension with blood pressure goal below 130/90, diabetes with hemoglobin A1c goal below 6.5% and lipids with LDL cholesterol goal below 70 mg/dL.I also advised the patient to eat a healthy diet with plenty of whole grains, lean meats, fruits and vegetables, exercise regularly and maintain ideal body weight .smoking cessation.    Orders Placed This Encounter  Procedures   MR BRAIN W WO CONTRAST   Cardiolipin antibodies, IgG, IgM, IgA   Prothrombin gene mutation   Factor 5 leiden   Homocysteine   Beta-2-glycoprotein i abs, IgG/M/A   Lupus anticoagulant   Protein C activity   Protein C, total   Protein S activity   Protein S, total   Antithrombin III   Ambulatory referral to Sleep Studies   No orders of the defined types were placed in this encounter.   Cc: No ref. provider found,  No primary care provider on file.  Sarina Ill, MD  [facility] [address] [place], [place] [address]  Phone [phone] Fax [phone]   I spent 85 minutes of face-to-face and non-face-to-face time with patient on the  1. TIA (transient ischemic attack)   2. Snores   3. Morning headache   4. Tongue deviation   5. Left sided numbness   6. Facial droop   7. Hypercoagulable state ([place])   8. Hx of pulmonary embolus    diagnosis.  This included previsit chart review, lab review, study review, order entry, electronic health record documentation, patient education on the different diagnostic and therapeutic options, counseling and coordination of care, risks and benefits of management, compliance, or risk factor reduction

## 2022-07-02 NOTE — Telephone Encounter (Signed)
Referral for Sleep Studies sent through [facility] to [facility]. 

## 2022-07-03 ENCOUNTER — Other Ambulatory Visit (INDEPENDENT_AMBULATORY_CARE_PROVIDER_SITE_OTHER): Payer: Self-pay

## 2022-07-03 DIAGNOSIS — D6859 Other primary thrombophilia: Secondary | ICD-10-CM

## 2022-07-03 DIAGNOSIS — R200 Anesthesia of skin: Secondary | ICD-10-CM

## 2022-07-03 DIAGNOSIS — R29810 Facial weakness: Secondary | ICD-10-CM

## 2022-07-03 DIAGNOSIS — Z86711 Personal history of pulmonary embolism: Secondary | ICD-10-CM

## 2022-07-03 DIAGNOSIS — G459 Transient cerebral ischemic attack, unspecified: Secondary | ICD-10-CM

## 2022-07-03 DIAGNOSIS — R519 Headache, unspecified: Secondary | ICD-10-CM

## 2022-07-03 DIAGNOSIS — Z0289 Encounter for other administrative examinations: Secondary | ICD-10-CM

## 2022-07-03 DIAGNOSIS — K148 Other diseases of tongue: Secondary | ICD-10-CM

## 2022-07-04 ENCOUNTER — Other Ambulatory Visit: Payer: Self-pay | Admitting: Orthopedic Surgery

## 2022-07-11 LAB — LUPUS ANTICOAGULANT
Dilute Viper Venom Time: 30.5 s (ref 0.0–47.0)
PTT Lupus Anticoagulant: 28 s (ref 0.0–43.5)
Thrombin Time: 18.1 s (ref 0.0–23.0)
dPT Confirm Ratio: 1.37 Ratio — ABNORMAL HIGH (ref 0.00–1.34)
dPT: 42 s (ref 0.0–47.6)

## 2022-07-11 LAB — PROTEIN S ACTIVITY: Protein S Activity: 84 % (ref 63–140)

## 2022-07-11 LAB — BETA-2-GLYCOPROTEIN I ABS, IGG/M/A
Beta-2 Glyco 1 IgA: <9 GPI IgA units (ref 0–25)
Beta-2 Glyco 1 IgM: <9 GPI IgM units (ref 0–32)
Beta-2 Glyco I IgG: <9 GPI IgG units (ref 0–20)

## 2022-07-11 LAB — FACTOR 5 LEIDEN

## 2022-07-11 LAB — PROTHROMBIN GENE MUTATION

## 2022-07-11 LAB — ANTITHROMBIN III: AntiThromb III Func: 115 % (ref 75–135)

## 2022-07-11 LAB — PROTEIN C, TOTAL: Protein C Antigen: 65 % (ref 60–150)

## 2022-07-11 LAB — PROTEIN C ACTIVITY: Protein C Activity: 103 % (ref 73–180)

## 2022-07-11 LAB — HOMOCYSTEINE: Homocysteine: 13.9 umol/L (ref 0.0–14.5)

## 2022-07-11 LAB — PROTEIN S, TOTAL: Protein S Ag, Total: 67 % (ref 60–150)

## 2022-07-12 ENCOUNTER — Encounter: Payer: Self-pay | Admitting: Orthopedic Surgery

## 2022-07-12 ENCOUNTER — Telehealth: Payer: Self-pay | Admitting: Neurology

## 2022-07-12 ENCOUNTER — Ambulatory Visit: Payer: Medicaid Other | Admitting: Orthopedic Surgery

## 2022-07-12 DIAGNOSIS — G8929 Other chronic pain: Secondary | ICD-10-CM

## 2022-07-12 DIAGNOSIS — Z9889 Other specified postprocedural states: Secondary | ICD-10-CM

## 2022-07-12 DIAGNOSIS — M25561 Pain in right knee: Secondary | ICD-10-CM | POA: Diagnosis not present

## 2022-07-12 DIAGNOSIS — M25562 Pain in left knee: Secondary | ICD-10-CM | POA: Diagnosis not present

## 2022-07-12 DIAGNOSIS — M2241 Chondromalacia patellae, right knee: Secondary | ICD-10-CM

## 2022-07-12 DIAGNOSIS — M94261 Chondromalacia, right knee: Secondary | ICD-10-CM

## 2022-07-12 DIAGNOSIS — M1710 Unilateral primary osteoarthritis, unspecified knee: Secondary | ICD-10-CM

## 2022-07-12 DIAGNOSIS — M1711 Unilateral primary osteoarthritis, right knee: Secondary | ICD-10-CM

## 2022-07-12 MED ORDER — METHYLPREDNISOLONE ACETATE 40 MG/ML IJ SUSP
40.0000 mg | Freq: Once | INTRAMUSCULAR | Status: AC
  Administered 2022-07-12: 40 mg via INTRA_ARTICULAR

## 2022-07-12 NOTE — Telephone Encounter (Signed)
Jackquline [place] Josem Kaufmann: [id number] exp. 07/04/2022-09/02/2022 sent to [facility] [phone]

## 2022-07-12 NOTE — Patient Instructions (Signed)

## 2022-07-12 NOTE — Progress Notes (Signed)
Follow-up visit  Encounter Diagnoses  Name Primary?   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 Partial-thickness cartilage loss of the trochlea.   Medial: Partial-thickness cartilage loss of the medial femorotibial compartment.   Lateral: Mild partial-thickness cartilage loss of the lateral femorotibial compartment.  Procedure note for bilateral knee injections  Procedure note left knee injection verbal consent was obtained to inject left knee joint  Timeout was completed to confirm the site of injection  The medications used were 40 mg depomedrol and 3 cc of 1% lidocaine  Anesthesia was provided by ethyl chloride and the skin was prepped with alcohol.  After cleaning the skin with alcohol a 20-gauge needle was used to inject the left knee joint. There were no complications. A sterile bandage was applied.   Procedure note right knee injection verbal consent was obtained to inject right knee joint  Timeout was completed to confirm the site of injection  The medications used were 40 mg depomedrol and 3 cc of 1% lidocaine  Anesthesia was provided by ethyl chloride and the skin was prepped with alcohol.  After cleaning the skin with alcohol a 20-gauge needle was used to inject the right knee joint. There were no complications. A sterile  bandage was applied.

## 2022-07-12 NOTE — Addendum Note (Signed)
Addended by: Obie Dredge A on: 07/12/2022 01:39 PM   Modules accepted: Orders

## 2022-07-15 ENCOUNTER — Other Ambulatory Visit: Payer: Self-pay | Admitting: Neurology

## 2022-07-15 DIAGNOSIS — R760 Raised antibody titer: Secondary | ICD-10-CM

## 2022-07-19 ENCOUNTER — Ambulatory Visit (HOSPITAL_COMMUNITY)
Admission: RE | Admit: 2022-07-19 | Discharge: 2022-07-19 | Disposition: A | Discharge status: Home / Self Care | Payer: Medicaid Other | Source: Ambulatory Visit | Attending: Neurology | Admitting: Neurology

## 2022-07-19 DIAGNOSIS — K148 Other diseases of tongue: Secondary | ICD-10-CM | POA: Diagnosis present

## 2022-07-19 DIAGNOSIS — G459 Transient cerebral ischemic attack, unspecified: Secondary | ICD-10-CM | POA: Diagnosis not present

## 2022-07-19 DIAGNOSIS — R519 Headache, unspecified: Secondary | ICD-10-CM | POA: Insufficient documentation

## 2022-07-19 DIAGNOSIS — R29810 Facial weakness: Secondary | ICD-10-CM | POA: Insufficient documentation

## 2022-07-19 DIAGNOSIS — R200 Anesthesia of skin: Secondary | ICD-10-CM | POA: Diagnosis present

## 2022-07-19 MED ORDER — GADOBUTROL 1 MMOL/ML IV SOLN
10.0000 mL | Freq: Once | INTRAVENOUS | Status: AC | PRN
  Administered 2022-07-19: 10 mL via INTRAVENOUS

## 2022-08-06 ENCOUNTER — Ambulatory Visit: Payer: Medicaid Other | Admitting: Neurology

## 2022-08-06 ENCOUNTER — Encounter: Payer: Self-pay | Admitting: Neurology

## 2022-08-06 VITALS — BP 119/74 | HR 74 | Ht 69.0 in | Wt 225.0 lb

## 2022-08-06 DIAGNOSIS — E669 Obesity, unspecified: Secondary | ICD-10-CM

## 2022-08-06 DIAGNOSIS — Z9189 Other specified personal risk factors, not elsewhere classified: Secondary | ICD-10-CM | POA: Diagnosis not present

## 2022-08-06 DIAGNOSIS — R519 Headache, unspecified: Secondary | ICD-10-CM

## 2022-08-06 DIAGNOSIS — G459 Transient cerebral ischemic attack, unspecified: Secondary | ICD-10-CM

## 2022-08-06 DIAGNOSIS — Z820 Family history of epilepsy and other diseases of the nervous system: Secondary | ICD-10-CM

## 2022-08-06 DIAGNOSIS — R0683 Snoring: Secondary | ICD-10-CM | POA: Diagnosis not present

## 2022-08-06 NOTE — Patient Instructions (Signed)

## 2022-08-06 NOTE — Progress Notes (Signed)
Subjective:    Patient ID: Jaime Massey is a 47 y.o. female.  HPI    Jaime Age, MD, PhD [facility] [address] [place], [place] [address]  Dear Jaime Massey,  I saw your patient, Jaime Massey, upon your kind request in my sleep clinic today for initial consultation of her sleep disorder, in particular, concern for underlying obstructive sleep apnea.  The patient is unaccompanied today.  As you know, Jaime Massey is a 47 year old female with an underlying medical history of TIA, history of DVT and PE, hypercoagulable state, history of febrile seizures as a child, arthritis, asthma, anxiety, depression, migraine headaches, and mild obesity, who reports snoring and excessive daytime somnolence as well as morning headaches.  Her Epworth sleepiness score is 2/24, fatigue severity score is 39/63. I reviewed your office note from 07/02/2022.  She reports intermittent left foot numbness, no telltale sciatica, no other numbness, has not discussed this with you yet.  She is encouraged to do so.  Otherwise, she has not had any recurrence of weakness or other new symptoms.  She lives with her boyfriend, her 48 year old son and 56-year-old granddaughter, whom she has custody of.  She has altogether 3 grown daughters and a younger son.  She works part-time as a Nurse, adult and also cleans houses.  She does not have any nightly nocturia, she drinks caffeine in the form of soda or tea, sometimes up to 5 soda cans per day.  She does not currently drink any alcohol, she quit smoking some 5 years ago.  Her weight has been fluctuating.  Her brother has sleep apnea.  She currently takes trazodone 50 mg at night, in the past she had taken trazodone up to 150 mg at night.  Her snoring can be loud and disturbing to her boyfriend and they often do not end up sleeping in the same bedroom.  Her Past Medical History Is Significant For: Past Medical History:  Diagnosis Date    Anxiety    Arthritis    Asthma due to environmental allergies    pt states she has never had asthma problems    Depression    DVT (deep venous thrombosis) (HCC)    post c-section   Migraine    PE (pulmonary embolism)    post c-section   Seizures ([place])    as child from severe ear infections- no meds now and no seizures since Massey 64   Warfarin anticoagulation    took from 03/2010-12/2010.     Her Past Surgical History Is Significant For: Past Surgical History:  Procedure Laterality Date   CESAREAN SECTION     CHOLECYSTECTOMY N/A 12/21/2012   Procedure: LAPAROSCOPIC CHOLECYSTECTOMY;  Surgeon: Donato Heinz, MD;  Location: [facility];  Service: General;  Laterality: N/A;   KNEE ARTHROSCOPY WITH MEDIAL MENISECTOMY Right 03/05/2017   Procedure: chondroplasty patella, medial femoral condyle;  Surgeon: Carole Civil, MD;  Location: [facility];  Service: Orthopedics;  Laterality: Right;    Her Family History Is Significant For: Family History  Problem Relation Massey of Onset   Breast cancer Maternal Grandmother    Skin cancer Maternal Grandmother    Cancer Maternal Grandfather    Diabetes Paternal Grandmother    Heart disease Paternal Grandfather    Breast cancer Mother    Aortic aneurysm Father    Stroke Neg Hx        none aware of    Neuropathy Neg Hx  none aware of     Her Social History Is Significant For: Social History   Socioeconomic History   Marital status: Divorced    Spouse name: Not on file   Number of children: 4   Years of education: Not on file   Highest education level: Some college, no degree  Occupational History   Not on file  Tobacco Use   Smoking status: Former    Packs/day: 0.50    Years: 26.00    Total pack years: 13.00    Types: Cigarettes    Quit date: 10/2017    Years since quitting: 4.8   Smokeless tobacco: Never  Vaping Use   Vaping Use: Never used  Substance and Sexual Activity   Alcohol use: Yes    Comment: reports occasional beer  but very seldom   Drug use: No   Sexual activity: Yes    Birth control/protection: I.U.D.  Other Topics Concern   Not on file  Social History Narrative   Lives at home with son and boyfriend   Right handed   Caffeine: soda daily   Social Determinants of Health   Financial Resource Strain: Not on file  Food Insecurity: Not on file  Transportation Needs: Not on file  Physical Activity: Not on file  Stress: Not on file  Social Connections: Not on file    Her Allergies Are:  Allergies  Allergen Reactions   Amoxicillin Hives   Penicillins Rash  :   Her Current Medications Are:  Outpatient Encounter Medications as of 08/06/2022  Medication Sig   acetaminophen (TYLENOL) 500 MG tablet Take 1,500 mg by mouth in the morning and at bedtime.   aspirin EC 81 MG tablet Take 1 tablet (81 mg total) by mouth daily. Swallow whole.   buPROPion (WELLBUTRIN XL) 150 MG 24 hr tablet Take 150 mg by mouth daily.   busPIRone (BUSPAR) 10 MG tablet Take 10 mg by mouth 2 (two) times daily. 10 mg AM 5 mg PM   gabapentin (NEURONTIN) 100 MG capsule Take 300 mg by mouth at bedtime.   levonorgestrel (MIRENA, 52 MG,) 20 MCG/24HR IUD 1 each by Intrauterine route once.   meloxicam (MOBIC) 7.5 MG tablet TAKE 1 TABLET BY MOUTH IN THE MORNING AND AT BEDTIME   omeprazole (PRILOSEC) 20 MG capsule Take 20 mg by mouth daily.   predniSONE (DELTASONE) 10 MG tablet TAKE 1 TABLET BY MOUTH ONCE DAILY AS NEEDED IF  SWELLING  IN  RIGHT  KNEE  NOT  IMPROVED  WITH  IBUPROFEN   RESTASIS 0.05 % ophthalmic emulsion Place 1 drop into both eyes 2 (two) times daily.   traZODone (DESYREL) 50 MG tablet Take 50 mg by mouth at bedtime.   acetaminophen-codeine (TYLENOL #3) 300-30 MG tablet Take 1 tablet by mouth every 6 (six) hours as needed for moderate pain.   atorvastatin (LIPITOR) 40 MG tablet Take 40 mg by mouth daily.   lamoTRIgine 50 MG TBDP Take 50 mg by mouth daily. (Patient not taking: Reported on 07/12/2022)    Facility-Administered Encounter Medications as of 08/06/2022  Medication   sodium chloride flush (NS) 0.9 % injection 16 mL  :   Review of Systems:  Out of a complete 14 point review of systems, all are reviewed and negative with the exception of these symptoms as listed below:   Review of Systems  Neurological:        Pt here for sleep consult  Pt snores,fatigue,headaches,hx of stroke Pt denies hypertension,sleep study,CPAP  machine     ESS:2 FSS:39    Objective:  Neurological Exam  Physical Exam Physical Examination:   Vitals:   08/06/22 1349  BP: 119/74  Pulse: 74    General Examination: The patient is a very pleasant 47 y.o. female in no acute distress. She appears well-developed and well-nourished and well groomed.   HEENT: Normocephalic, atraumatic, pupils are equal, round and reactive to light, extraocular tracking is good without limitation to gaze excursion or nystagmus noted. Hearing is grossly intact. Face is symmetric with normal facial animation. Speech is clear with no dysarthria noted. There is no hypophonia. There is no lip, neck/head, jaw or voice tremor. Neck is supple with full range of passive and active motion. There are no carotid bruits on auscultation. Oropharynx exam reveals: mild mouth dryness, adequate dental hygiene and mild airway crowding secondary to small airway entry, tonsils on the smaller side, Mallampati class I, neck circumference 15-1/4 inches, mild to moderate overbite noted.  Tongue protrudes centrally and palate elevates symmetrically.    Chest: Clear to auscultation without wheezing, rhonchi or crackles noted.  Heart: S1+S2+0, regular and normal without murmurs, rubs or gallops noted.   Abdomen: Soft, non-tender and non-distended.  Extremities: There is no pitting edema in the distal lower extremities bilaterally.   Skin: Warm and dry without trophic changes noted.   Musculoskeletal: exam reveals no obvious joint deformities.    Neurologically:  Mental status: The patient is awake, alert and oriented in all 4 spheres. Her immediate and remote memory, attention, language skills and fund of knowledge are appropriate. There is no evidence of aphasia, agnosia, apraxia or anomia. Speech is clear with normal prosody and enunciation. Thought process is linear. Mood is normal and affect is normal.  Cranial nerves II - XII are as described above under HEENT exam.  Motor exam: Normal bulk, strength and tone is noted. There is no obvious action or resting tremor.  Fine motor skills and coordination: grossly intact.  Cerebellar testing: No dysmetria or intention tremor. There is no truncal or gait ataxia.  Sensory exam: intact to light touch in the upper and lower extremities.  Gait, station and balance: She stands easily. No veering to one side is noted. No leaning to one side is noted. Posture is Massey-appropriate and stance is narrow based. Gait shows normal stride length and normal pace. No problems turning are noted.   Assessment and Plan:  In summary, Jaime Massey is a very pleasant 47 y.o.-year old female with an underlying medical history of TIA, history of DVT and PE, hypercoagulable state, history of febrile seizures as a child, arthritis, asthma, anxiety, depression, migraine headaches, and mild obesity, whose history and physical exam are concerning for sleep disordered breathing, particularly obstructive sleep apnea (OSA). A laboratory attended sleep study is typically considered "gold standard" for evaluation of sleep disordered breathing.   I had a long chat with the patient about my findings and the diagnosis of sleep apnea, particularly OSA, its prognosis and treatment options. We talked about medical/conservative treatments, surgical interventions and non-pharmacological approaches for symptom control. I explained, in particular, the risks and ramifications of untreated moderate to severe OSA, especially with respect  to developing cardiovascular disease down the road, including congestive heart failure (CHF), difficult to treat hypertension, cardiac arrhythmias (particularly A-fib), neurovascular complications including TIA, stroke and dementia. Even type 2 diabetes has, in part, been linked to untreated OSA. Symptoms of untreated OSA may include (but may not be limited to) daytime  sleepiness, nocturia (i.e. frequent nighttime urination), memory problems, mood irritability and suboptimally controlled or worsening mood disorder such as depression and/or anxiety, lack of energy, lack of motivation, physical discomfort, as well as recurrent headaches, especially morning or nocturnal headaches. We talked about the importance of maintaining a healthy lifestyle and striving for healthy weight. In addition, we talked about the importance of striving for and maintaining good sleep hygiene. I recommended a sleep study at this time. I outlined the differences between a laboratory attended sleep study which is considered more comprehensive and accurate over the option of a home sleep test (HST); the latter may lead to underestimation of sleep disordered breathing in some instances and does not help with diagnosing upper airway resistance syndrome and is not accurate enough to diagnose primary central sleep apnea typically. I outlined possible surgical and non-surgical treatment options of OSA, including the use of a positive airway pressure (PAP) device (i.e. CPAP, AutoPAP/APAP or BiPAP in certain circumstances), a custom-made dental device (aka oral appliance, which would require a referral to a specialist dentist or orthodontist typically, and is generally speaking not considered for patients with full dentures or edentulous state), upper airway surgical options, such as traditional UPPP (which is not considered a first-line treatment) or the Inspire device (hypoglossal nerve stimulator, which would involve a referral for consultation  with an ENT surgeon, after careful selection, following inclusion criteria - also not first-line treatment). I explained the PAP treatment option to the patient in detail, as this is generally considered first-line treatment.  The patient indicated that she would be willing to try PAP therapy, if the need arises. I explained the importance of being compliant with PAP treatment, not only for insurance purposes but primarily to improve patient's symptoms symptoms, and for the patient's long term health benefit, including to reduce Her cardiovascular risks longer-term.    We will pick up our discussion about the next steps and treatment options after testing.  We will keep her posted as to the test results by phone call and/or MyChart messaging where possible.  We will plan to follow-up in sleep clinic accordingly as well.  I answered all her questions today and the patient was in agreement.   I encouraged her to call with any interim questions, concerns, problems or updates or email [place] through MyChart.  Generally speaking, sleep test authorizations may take up to 2 weeks, sometimes less, sometimes longer, the patient is encouraged to get in touch with [place] if they do not hear back from the sleep lab staff directly within the next 2 weeks.  Thank you very much for allowing me to participate in the care of this nice patient. If I can be of any further assistance to you please do not hesitate to talk to me.  Sincerely,   Huston Foley, MD, PhD

## 2022-08-21 ENCOUNTER — Telehealth: Payer: Self-pay | Admitting: Neurology

## 2022-08-21 NOTE — Telephone Encounter (Signed)
[facility] pending uploaded notes on the portal

## 2022-08-26 NOTE — Telephone Encounter (Signed)
Checked status it is still pending.

## 2022-09-05 ENCOUNTER — Encounter: Payer: Self-pay | Admitting: Radiology

## 2022-09-10 NOTE — Telephone Encounter (Signed)
[facility]- [facility] Jaime KaufmannSD:[id number] (exp. 08/21/22 to 11/24/22)   Patient is scheduled at [facility] for 09/29/22 at 8 pm.  Mailed packet to the patient.

## 2022-09-20 ENCOUNTER — Ambulatory Visit: Payer: Medicaid Other | Admitting: Orthopedic Surgery

## 2022-09-23 ENCOUNTER — Ambulatory Visit (INDEPENDENT_AMBULATORY_CARE_PROVIDER_SITE_OTHER): Payer: Medicaid Other | Admitting: Orthopedic Surgery

## 2022-09-23 ENCOUNTER — Other Ambulatory Visit (INDEPENDENT_AMBULATORY_CARE_PROVIDER_SITE_OTHER): Payer: Medicaid Other

## 2022-09-23 ENCOUNTER — Encounter: Payer: Self-pay | Admitting: Orthopedic Surgery

## 2022-09-23 DIAGNOSIS — G8929 Other chronic pain: Secondary | ICD-10-CM

## 2022-09-23 DIAGNOSIS — M1711 Unilateral primary osteoarthritis, right knee: Secondary | ICD-10-CM

## 2022-09-23 DIAGNOSIS — M238X1 Other internal derangements of right knee: Secondary | ICD-10-CM | POA: Diagnosis not present

## 2022-09-23 DIAGNOSIS — M2241 Chondromalacia patellae, right knee: Secondary | ICD-10-CM

## 2022-09-23 DIAGNOSIS — Z9889 Other specified postprocedural states: Secondary | ICD-10-CM | POA: Diagnosis not present

## 2022-09-23 DIAGNOSIS — M94261 Chondromalacia, right knee: Secondary | ICD-10-CM | POA: Diagnosis not present

## 2022-09-23 NOTE — Progress Notes (Signed)
FOLLOW UP   Encounter Diagnoses  Name Primary?   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      Chief Complaint  Patient presents with   Knee Pain    Right      47 year old female status post knee arthroscopy with exam under anesthesia with findings of chondromalacia in all 3 compartments old anterior cruciate ligament injury with proximal femoral detachment and scarring to the PCL stable under anesthesia with a stable Lachman and stable pivot shift here for imaging to assess the progression if any of arthritis in her knee  Current Outpatient Medications  Medication Instructions   acetaminophen (TYLENOL) 1,500 mg, Oral, 2 times daily   acetaminophen-codeine (TYLENOL #3) 300-30 MG tablet 1 tablet, Oral, Every 6 hours PRN   aspirin EC 81 mg, Oral, Daily, Swallow whole.               gabapentin (NEURONTIN) 300 mg, Oral, Daily at bedtime    50 mg, Oral, Daily    1 each, Intrauterine,  Once   meloxicam (MOBIC) 7.5 MG tablet TAKE 1 TABLET BY MOUTH IN THE MORNING AND AT BEDTIME   omeprazole (PRILOSEC) 20 mg, Oral, Daily   predniSONE (DELTASONE) 10 MG tablet TAKE 1 TABLET BY MOUTH ONCE DAILY AS NEEDED IF  SWELLING  IN  RIGHT  KNEE  NOT  IMPROVED  WITH  IBUPROFEN    Avalie has pain when she lies down and with excessive walking she is currently on meloxicam twice a day prednisone as needed  Her knee x-ray shows grade 3 arthritis  Her clinical exam  Physical Exam Musculoskeletal:     Right knee: Crepitus present. No swelling, deformity, effusion, erythema, ecchymosis or lacerations. Normal range of motion. Tenderness present. No ACL laxity or PCL laxity. Normal alignment, normal meniscus and normal patellar mobility.     Comments: Gait normal    Assessment and plan  Chronic  arthritis right knee in a 47 year old female with a history of DVT and pulmonary embolus who has grade 3 arthritis but should be continued to be managed with nonoperative treatment.  She seems to respond well to 47-month injections and we will set 1 of those up for next month

## 2022-09-29 ENCOUNTER — Ambulatory Visit (INDEPENDENT_AMBULATORY_CARE_PROVIDER_SITE_OTHER): Payer: Medicaid Other | Admitting: Neurology

## 2022-09-29 DIAGNOSIS — R519 Headache, unspecified: Secondary | ICD-10-CM | POA: Diagnosis not present

## 2022-09-29 DIAGNOSIS — R0683 Snoring: Secondary | ICD-10-CM | POA: Diagnosis not present

## 2022-09-29 DIAGNOSIS — Z9189 Other specified personal risk factors, not elsewhere classified: Secondary | ICD-10-CM

## 2022-09-29 DIAGNOSIS — G459 Transient cerebral ischemic attack, unspecified: Secondary | ICD-10-CM

## 2022-09-29 DIAGNOSIS — Z820 Family history of epilepsy and other diseases of the nervous system: Secondary | ICD-10-CM

## 2022-09-29 DIAGNOSIS — G4720 Circadian rhythm sleep disorder, unspecified type: Secondary | ICD-10-CM

## 2022-09-29 DIAGNOSIS — E669 Obesity, unspecified: Secondary | ICD-10-CM

## 2022-10-02 NOTE — Procedures (Signed)
Physician Interpretation:     [facility] POLYSOMNOGRAPHY  INTERPRETATION REPORT   STUDY DATE:  09/29/2022     PATIENT NAME:  Jaime Massey         DATE OF BIRTH:  1976/04/19  PATIENT ID:  [id number]    TYPE OF STUDY:  PSG  READING PHYSICIAN: Star Age, MD, PhD   SCORING TECHNICIAN: Richard Miu, RPSGT  Referred by: Dr. Sarina Ill  History and Indication for Testing: 47 year old female with an underlying medical history of TIA, history of DVT and PE, hypercoagulable state, history of febrile seizures as a child, arthritis, asthma, anxiety, depression, migraine headaches, and mild obesity, who reports snoring and excessive daytime somnolence as well as morning headaches. Her Epworth sleepiness score is 2/24, fatigue severity score is 39/63.?  Height: 69 in Weight: 225 lb (BMI 33) Neck Size: 15 in   MEDICATIONS: Tylenol, Aspirin, Wellbutrin XL, Buspar, Neurontin, Mirena, Mobic, Prilosec, Prednisone, Restasis, Trazodone, Acetaminophen-Codeine, Lipitor, Lamotrigine  TECHNICAL DESCRIPTION: A registered sleep technologist was in attendance for the duration of the recording.  Data collection, scoring, video monitoring, and reporting were performed in compliance with the AASM Manual for the Scoring of Sleep and Associated Events; (Hypopnea is scored based on the criteria listed in Section VIII D. 1b in the AASM Manual V2.6 using a 4% oxygen desaturation rule or Hypopnea is scored based on the criteria listed in Section VIII D. 1a in the AASM Manual V2.6 using 3% oxygen desaturation and /or arousal rule).   SLEEP CONTINUITY AND SLEEP ARCHITECTURE:  Lights-out was at 20:59: and lights-on at  05:36:, with a total recording time of 8 hours, 37.5 min. Total sleep time ( TST) was 384.0 minutes with a decreased sleep efficiency at 74.2%. There was  12.4% REM sleep.   BODY POSITION:  TST was divided  between the following sleep positions: 67.6% supine;  32.4% lateral;   0% prone. Duration of total sleep and percent of total sleep in their respective position is as follows: supine 259 minutes (68%), non-supine 125 minutes (32%); right 00 minutes (0%), left 124 minutes (32%), and prone 00 minutes (0%).  Total supine REM sleep time was 16 minutes (35% of total REM sleep).  Sleep latency was increased at 89.5 minutes.  REM sleep latency was increased at 271.5 minutes. Of the total sleep time, the percentage of stage N1 sleep was 5.1%, stage N2 sleep was 63%, which is increased, stage N3 sleep was 19.3%, which is normal, and REM sleep was 12.4%, which is decreased. Wake after sleep onset (WASO) time accounted for 44 minutes with sleep fragmentation ranging from minimal to moderate.   RESPIRATORY MONITORING:   Based on CMS criteria (using a 4% oxygen desaturation rule for scoring hypopneas), there were 4 apneas (3 obstructive; 1 central; 0 mixed), and 2 hypopneas.  Apnea index was 0.6. Hypopnea index was 0.3. The apnea-hypopnea index was 0.9 overall (1.2 supine, 2 non-supine; 3.8 REM, 7.3 supine REM).  There were 0 respiratory effort-related arousals (RERAs).  The RERA index was 0 events/h. Total respiratory disturbance index (RDI) was 0.9 events/h. RDI results showed: supine RDI  1.2 /h; non-supine RDI 0.5 /h; REM RDI 3.8 /h, supine REM RDI 7.3 /h.   Based on AASM criteria (using a 3% oxygen desaturation and /or arousal rule for scoring hypopneas), there were 4 apneas (3 obstructive; 1 central; 0 mixed), and 2 hypopneas. Apnea index was 0.6. Hypopnea index was 0.3. The apnea-hypopnea index was 0.9/hour overall (1.2  supine, 2 non-supine; 3.8 REM, 7.3 supine REM).  There were 0 respiratory effort-related arousals (RERAs).  The RERA index was 0 events/h. Total respiratory disturbance index (RDI) was 0.9 events/h. RDI results showed: supine RDI  1.2 /h; non-supine RDI 0.5 /h; REM RDI 3.8 /h, supine REM RDI 7.3 /h.   OXIMETRY: Oxyhemoglobin Saturation Nadir during sleep was at   92% from a mean of 96%.  Of the Total sleep time (TST)   hypoxemia (=<88%) was present for  0.0 minutes, or 0.0% of total sleep time.   LIMB MOVEMENTS: There were 63 periodic limb movements of sleep (9.8/hr), of which 0 (0.0/hr) were associated with an arousal.  AROUSAL: There were 66 arousals in total, for an arousal index of 10 arousals/hour.  Of these, 2 were identified as respiratory-related arousals (0 /h), 0 were PLM-related arousals (0 /h), and 68 were non-specific arousals (11 /h).  EEG: Review of the EEG showed no abnormal electrical discharges and symmetrical bihemispheric findings.    EKG: The EKG revealed normal sinus rhythm (NSR). The average heart rate during sleep was 68 bpm.   AUDIO/VIDEO REVIEW: The audio and video review did not show any abnormal or unusual behaviors, movements, phonations or vocalizations. The patient took 1 restroom break.  Intermittent mild to moderate snoring was detected.  POST-STUDY QUESTIONNAIRE: Post study, the patient indicated, that sleep was the same as usual.   IMPRESSION:  1. Primary Snoring 2. Dysfunctions associated with sleep stages or arousal from sleep  RECOMMENDATIONS:  1. This study does not demonstrate any significant obstructive or central sleep disordered breathing with an AHI of less than 5/hour - her total AHI was 0.9/h, O2 nadir 92%.  Intermittent mild to moderate snoring was detected. Treatment with a positive airway pressure device, such as CPAP or autoPAP is not indicated. Weight loss and avoiding the supine sleep position may aid in reducing her snoring.  2. This study shows some sleep fragmentation and abnormal sleep stage percentages; these are nonspecific findings and per se do not signify an intrinsic sleep disorder or a cause for the patient's sleep-related symptoms. Causes include (but are not limited to) the first night effect of the sleep study, circadian rhythm disturbances, medication effect or an underlying mood disorder  or medical problem.  3. The patient should be cautioned not to drive, work at heights, or operate dangerous or heavy equipment when tired or sleepy. Review and reiteration of good sleep hygiene measures should be pursued with any patient. 4. The patient will be advised to follow up with the referring provider, who will be notified of the test results.   I certify that I have reviewed the entire raw data recording prior to the issuance of this report in accordance with the Standards of Accreditation of the American Academy of Sleep Medicine (AASM).  Star Age, MD, PhD Medical Director, [facility]) [place], ABPN (Neurology and Sleep)               Technical Report:   General Information  Name: Jaime Massey, Jaime Massey BMI: [id number] Physician: Star Age, MD  ID: [id number] Height: 69.0 in Technician: Richard Miu, RPSGT  Sex: Female Weight: 225.0 lb Record: [id number]  Age: 47 [11/02/1975] Date: 09/29/2022    Medical & Medication History    47 year old female with an underlying medical history of TIA, history of DVT and PE, hypercoagulable state, history of febrile seizures as a child, arthritis, asthma, anxiety, depression, migraine headaches, and mild obesity, who reports  snoring and excessive daytime somnolence as well as morning headaches.  Tylenol, Aspirin, Wellbutrin XL, Buspar, Neurontin, Mirena, Mobic, Prilosec, Prednisone, Restasis, Trazodone, Acetaminophen-Codeine, Lipitor, Lamotrigine   Sleep Disorder      Comments   The patient came into the sleep lab for a PSG. The patient took gabapentin, Wellbutrin, Trazodone, and Buspar prior to going to sleep. One restroom trip. EKG kept in NSR. Mild to moderate snoring. PLM's witnessed. All respiratory events scored with a 4% desat. The patient slept supine and lateral. All sleep stages observed. The patients AHI was 0.5 after 2 hrs of TST.     Lights out: 08:59:13 PM Lights on: 05:36:29 AM    Time Total Supine Side Prone Upright  Recording (TRT) 8h 37.63m 5h 55.2m 2h 42.58m 0h 0.69m 0h 0.71m  Sleep (TST) 6h 24.84m 4h 19.70m 2h 4.77m 0h 0.35m 0h 0.23m   Latency N1 N2 N3 REM Onset Per. Slp. Eff.  Actual 0h 0.32m 0h 4.41m 2h 27.60m 4h 31.58m 1h 29.67m 1h 29.55m 74.20%   Stg Dur Wake N1 N2 N3 REM  Total 133.5 19.5 243.0 74.0 47.5  Supine 95.5 11.5 177.5 54.0 16.5  Side 38.0 8.0 65.5 20.0 31.0  Prone 0.0 0.0 0.0 0.0 0.0  Upright 0.0 0.0 0.0 0.0 0.0   Stg % Wake N1 N2 N3 REM  Total 25.8 5.1 63.3 19.3 12.4  Supine 18.5 3.0 46.2 14.1 4.3  Side 7.3 2.1 17.1 5.2 8.1  Prone 0.0 0.0 0.0 0.0 0.0  Upright 0.0 0.0 0.0 0.0 0.0     Apnea Summary Sub Supine Side Prone Upright  Total 4 Total 4 3 1  0 0    REM 2 1 1  0 0    NREM 2 2 0 0 0  Obs 3 REM 1 1 0 0 0    NREM 2 2 0 0 0  Mix 0 REM 0 0 0 0 0    NREM 0 0 0 0 0  Cen 1 REM 1 0 1 0 0    NREM 0 0 0 0 0   Rera Summary Sub Supine Side Prone Upright  Total 0 Total 0 0 0 0 0    REM 0 0 0 0 0    NREM 0 0 0 0 0   Hypopnea Summary Sub Supine Side Prone Upright  Total 2 Total 2 2 0 0 0    REM 1 1 0 0 0    NREM 1 1 0 0 0   4% Hypopnea Summary Sub Supine Side Prone Upright  Total (4%) 2 Total 2 2 0 0 0    REM 1 1 0 0 0    NREM 1 1 0 0 0     AHI Total Obs Mix Cen  0.94 Apnea 0.63 0.47 0.00 0.16   Hypopnea 0.31 -- -- --  0.94 Hypopnea (4%) 0.31 -- -- --    Total Supine Side Prone Upright  Position AHI 0.94 1.16 0.48 0.00 0.00  REM AHI 3.79   NREM AHI 0.53   Position RDI 0.94 1.16 0.48 0.00 0.00  REM RDI 3.79   NREM RDI 0.53    4% Hypopnea Total Supine Side Prone Upright  Position AHI (4%) 0.94 1.16 0.48 0.00 0.00  REM AHI (4%) 3.79   NREM AHI (4%) 0.53   Position RDI (4%) 0.94 1.16 0.48 0.00 0.00  REM RDI (4%) 3.79   NREM RDI (4%) 0.53    Desaturation Information Threshold: 2% <100% <90% <80% <  70% <60% <50% <40%  Supine 152.0 2.0 0.0 0.0 0.0 0.0 0.0  Side 24.0 0.0 0.0 0.0 0.0 0.0 0.0  Prone 0.0 0.0 0.0 0.0 0.0 0.0 0.0   Upright 0.0 0.0 0.0 0.0 0.0 0.0 0.0  Total 176.0 2.0 0.0 0.0 0.0 0.0 0.0  Index 24.7 0.3 0.0 0.0 0.0 0.0 0.0   Threshold: 3% <100% <90% <80% <70% <60% <50% <40%  Supine 31.0 2.0 0.0 0.0 0.0 0.0 0.0  Side 6.0 0.0 0.0 0.0 0.0 0.0 0.0  Prone 0.0 0.0 0.0 0.0 0.0 0.0 0.0  Upright 0.0 0.0 0.0 0.0 0.0 0.0 0.0  Total 37.0 2.0 0.0 0.0 0.0 0.0 0.0  Index 5.2 0.3 0.0 0.0 0.0 0.0 0.0   Threshold: 4% <100% <90% <80% <70% <60% <50% <40%  Supine 18.0 2.0 0.0 0.0 0.0 0.0 0.0  Side 3.0 0.0 0.0 0.0 0.0 0.0 0.0  Prone 0.0 0.0 0.0 0.0 0.0 0.0 0.0  Upright 0.0 0.0 0.0 0.0 0.0 0.0 0.0  Total 21.0 2.0 0.0 0.0 0.0 0.0 0.0  Index 2.9 0.3 0.0 0.0 0.0 0.0 0.0   Threshold: 3% <100% <90% <80% <70% <60% <50% <40%  Supine 31 2 0 0 0 0 0  Side 6 0 0 0 0 0 0  Prone 0 0 0 0 0 0 0  Upright 0 0 0 0 0 0 0  Total 37 2 0 0 0 0 0   Awakening/Arousal Information # of Awakenings 11  Wake after sleep onset 44.61m  Wake after persistent sleep 44.28m   Arousal Assoc. Arousals Index  Apneas 1 0.2  Hypopneas 1 0.2  Leg Movements 1 0.2  Snore 0 0.0  PTT Arousals 0 0.0  Spontaneous 68 10.6  Total 71 11.1  Leg Movement Information PLMS LMs Index  Total LMs during PLMS 63 9.8  LMs w/ Microarousals 0 0.0   LM LMs Index  w/ Microarousal 1 0.2  w/ Awakening 0 0.0  w/ Resp Event 0 0.0  Spontaneous 21 3.3  Total 22 3.4     Desaturation threshold setting: 3% Minimum desaturation setting: 10 seconds SaO2 nadir: 87% The longest event was a 37 sec obstructive Hypopnea with a minimum SaO2 of 90%. The lowest SaO2 was 90% associated with a 37 sec obstructive Hypopnea. EKG Rates EKG Avg Max Min  Awake 72 101 57  Asleep 68 85 57  EKG Events: Tachycardia

## 2022-10-03 ENCOUNTER — Telehealth: Payer: Self-pay | Admitting: *Deleted

## 2022-10-03 NOTE — Telephone Encounter (Signed)
-----   Message from Star Age, MD sent at 10/02/2022  6:19 PM EDT ----- Patient referred by Dr. Jaynee Eagles, seen by me on 08/06/22, diagnostic PSG on 09/29/22.   Please call and notify the patient that the recent sleep study did not show any significant obstructive sleep apnea. Her total AHI was 0.9/h, O2 nadir 92%.  Intermittent mild to moderate snoring was detected. Treatment with a positive airway pressure device, such as CPAP or autoPAP is not indicated. Weight loss and avoiding the supine sleep position may aid in reducing her snoring. She can follow up with her providers as scheduled.  Thanks,  Star Age, MD, PhD [facility])

## 2022-10-03 NOTE — Telephone Encounter (Signed)
Spoke with the patient and discussed her sleep study results.  Patient understands that her sleep study did not show any significant obstructive sleep apnea.  Intermittent mild to moderate snoring was detected.  CPAP or AutoPap therapy treatment is not indicated.  Weight loss and avoiding the supine sleep position may aid in reducing her snoring.  Patient states she continues to have headaches and would like to follow-up with Dr Jaynee Eagles.  They did go away for a while after her stroke but have returned.  She states that she also has some trouble with her feet and her right hand cramping up at her feet go numb.  She states she feels tired all day she has not had any changes in her medications.  She gets up tired and even touching her body hurts.  She is going to come in next Monday for her repeat lupus anticoagulant level as the last lab was slightly elevated.  I told her I would discuss with Dr. Jaynee Eagles and give her a call back.

## 2022-10-14 ENCOUNTER — Other Ambulatory Visit: Payer: Self-pay | Admitting: Family Medicine

## 2022-10-14 ENCOUNTER — Other Ambulatory Visit (INDEPENDENT_AMBULATORY_CARE_PROVIDER_SITE_OTHER): Payer: Self-pay

## 2022-10-14 DIAGNOSIS — R760 Raised antibody titer: Secondary | ICD-10-CM

## 2022-10-14 DIAGNOSIS — Z0289 Encounter for other administrative examinations: Secondary | ICD-10-CM

## 2022-10-14 NOTE — Telephone Encounter (Signed)
Sent mychart msg informing pt of follow up made with Megan 

## 2022-10-14 NOTE — Telephone Encounter (Signed)
Jillian/amgel: Schedule with NP please tanks

## 2022-10-16 LAB — LUPUS ANTICOAGULANT
Dilute Viper Venom Time: 34.7 s (ref 0.0–47.0)
PTT Lupus Anticoagulant: 27.6 s (ref 0.0–43.5)
Thrombin Time: 18.5 s (ref 0.0–23.0)
dPT Confirm Ratio: 1.11 Ratio (ref 0.00–1.34)
dPT: 40.9 s (ref 0.0–47.6)

## 2022-10-21 ENCOUNTER — Ambulatory Visit (INDEPENDENT_AMBULATORY_CARE_PROVIDER_SITE_OTHER): Payer: Medicaid Other | Admitting: Orthopedic Surgery

## 2022-10-21 ENCOUNTER — Encounter: Payer: Self-pay | Admitting: Orthopedic Surgery

## 2022-10-21 DIAGNOSIS — M1711 Unilateral primary osteoarthritis, right knee: Secondary | ICD-10-CM

## 2022-10-21 DIAGNOSIS — M94261 Chondromalacia, right knee: Secondary | ICD-10-CM

## 2022-10-21 DIAGNOSIS — M25561 Pain in right knee: Secondary | ICD-10-CM | POA: Diagnosis not present

## 2022-10-21 DIAGNOSIS — Z9889 Other specified postprocedural states: Secondary | ICD-10-CM

## 2022-10-21 DIAGNOSIS — M2241 Chondromalacia patellae, right knee: Secondary | ICD-10-CM

## 2022-10-21 DIAGNOSIS — G8929 Other chronic pain: Secondary | ICD-10-CM

## 2022-10-21 MED ORDER — METHYLPREDNISOLONE ACETATE 40 MG/ML IJ SUSP
40.0000 mg | Freq: Once | INTRAMUSCULAR | Status: AC
  Administered 2022-10-21: 40 mg via INTRA_ARTICULAR

## 2022-10-21 NOTE — Progress Notes (Signed)
Chief Complaint  Patient presents with   Injections    Right knee    Encounter Diagnoses  Name Primary?   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  After cleaning the skin with alcohol a 20-gauge needle was used to inject the right knee joint. There were no complications. A sterile bandage was applied.

## 2022-10-28 ENCOUNTER — Other Ambulatory Visit: Payer: Self-pay | Admitting: Orthopedic Surgery

## 2022-10-29 ENCOUNTER — Ambulatory Visit (INDEPENDENT_AMBULATORY_CARE_PROVIDER_SITE_OTHER): Payer: Medicaid Other | Admitting: Adult Health

## 2022-10-29 ENCOUNTER — Encounter: Payer: Self-pay | Admitting: Adult Health

## 2022-10-29 VITALS — BP 116/57 | HR 73 | Ht 69.0 in | Wt 235.8 lb

## 2022-10-29 DIAGNOSIS — G43611 Persistent migraine aura with cerebral infarction, intractable, with status migrainosus: Secondary | ICD-10-CM

## 2022-10-29 DIAGNOSIS — I639 Cerebral infarction, unspecified: Secondary | ICD-10-CM

## 2022-10-29 MED ORDER — TOPIRAMATE 25 MG PO TABS: ORAL_TABLET | ORAL | 11 refills | Status: DC

## 2022-10-29 NOTE — Patient Instructions (Addendum)
Your Plan:  Consider Preventative medication: Topamax (should try first) 25 mg at bedtime for 1 week then increase to 50 mg at bedtime. If this does not help then we could try:  Ajovy or Emgality Injection or Qulipta oral daily medication  Abortive medication to take when you get a migraine: Consider Nurtec or Ubrelvy.   If your symptoms worsen or you develop new symptoms please let us know.   Thank you for coming to see [place] at [facility]. I hope we have been able to provide you high quality care today.  You may receive a patient satisfaction survey over the next few weeks. We would appreciate your feedback and comments so that we may continue to improve ourselves and the health of our patients.

## 2022-10-29 NOTE — Progress Notes (Signed)
PATIENT: Jaime Massey DOB: 26-Nov-1975  REASON FOR VISIT: follow up HISTORY FROM: patient PRIMARY NEUROLOGIST: Dr. Lucia Gaskins  Chief Complaint  Patient presents with   Follow-up    Pt in 20 with son Pt here for Migraines f/u Pt states still having headaches Pt states numbness,burning ,tingling  in both feet and hands  Pt states had stroke Dec 2023 overnight stay      HISTORY OF PRESENT ILLNESS: Today 10/29/22  Jaime Massey is a 47 y.o. female who has been followed in this office for Migraine headaches. Returns today for follow-up. Reports that she is having 3-4 headaches a week. Located left frontal region. Nausea, photophobia and phonophobia. Never been on a preventative medication. Does take tylenol daily for knee pain.  She states that she also gets intermittent numbness in the hands and feet.  Tends to be worse on the left side than the right.  Has had nerve conductions in the past that was unremarkable.  She states that she has been told in the past that the numbness and tingling could be related to her migraines.  She has an appointment next month with Dr. Pearlean Brownie for second opinion regarding previous stroke and follow-up with Dr. Lucia Gaskins in July.     HISTORY (copied from Dr. Trevor Mace note) 07/02/2022: We saw patient in 2021 for numbness. We completed an entire stroke workup. MRI brain and c-spine,emg/ncs, echo, CTA H&N, TCD in 2021 wasnormal an right-sided. 05/22/2022 She was at work and left cheek went numb, she had a facial droop, [facility] took [facility] to [facility], she had Procedures:CT head, Echocardiogram,MRI brain,CTA head and neck with contrast, CT head without contrast. She had a very bad headaches. She has headaches, she has a hx of migraines, she had a sharp pain on the left side of the head, followed by a severe headache for days, left side, light and sound sensitivity. They gave her TPA. Tobgue deviation, She wa still having facial droop during the imaging and tongue to the right lasted  for 24 hours, arm and left le weak, she was not on aspirin, nothing since 2021. No palpitations. She is wearing a 14-day heart monitor. Snoring is terrible, partner will not sleep in the same room, he even recorded it, wakes with morning headache and dry mouth. She was having a difficult time talking, sounds like dysarthri and not aphasia. Everything is resolved. No other focal neurologic deficits, associated symptoms, inciting events or modifiable factors.     Patient complains of symptoms per HPI as well as the following symptoms: TIA . Pertinent negatives and positives per HPI. All others negative     HPI 05/16/2020:  Jaime Massey is a 47 y.o. female here as requested by No ref. provider found for "Numbness and tingling in arms and legs needs NCV".  Past medical history migraine with aura, anxiety, obesity, asthma, chronic left knee pain, depression, daily persistent headache onset August 17, 2016 and fever of unknown origin, history of pulmonary embolus in 2011 6 weeks status post C-section, tobacco abuse, prior warfarin use.  I reviewed Dr. Sherilyn Dacosta examination which showed normal general, HEENT, neck, heart, lungs, abdomen, extremities, skin, neuro and psych examinations.  I reviewed Dr. Verna Czech notes: Patient reports tingling on the right side, a few weeks prior she had hip pain and popping, reports hip pain during appointment, has numbness in her right leg and right arm, she is also having headaches which are chronic, blood pressure was good today, has not had  her eyes checked this year, she reports being at home since June 2020, feels like she is always stressed, more so with her grown children who are 24, 22 and 17, she does report a lot of stress.   Patient is here alone and she reports numbness, in August 2021 she said her right side went numb at the end of vacation, she saw her doctor and she started gabapentin, does help some but she still has the numbness and feels it is in her hands and  foot, it is not the sharp tingling as it was before, she also has chronic headaches.In August they went on vacation, no significant exercise, she took a shower one evening and her right foot was swollen like a balloon, she went to bed and whole right side was numb, face, arm and leg, she drove home and thought she was going to have to pull over. She was dizzy, no known facial droop, no vision changes or talking problems, it never resolved, Gabapentin may help with the symptoms its a sharp tingling but her hand and right foot are still numb. Acute onset. When her right foot was swollen the lower leg sore. Symptoms are in the entire hand on the right, right foot feels different. The right side of the face was definitely involved initially. 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    Reviewed notes, labs and imaging from outside physicians, which showed:   MRI of the brain wo contrast: reviewed report no acute intracranial process   04/12/2020: CBC and CMP  unremarkable, BUN 9, Creat 0.69, hgba1c 5.2 02/23/2020: Hgba1c 5.2, TSH 0.831    REVIEW OF SYSTEMS: Out of a complete 14 system review of symptoms, the patient complains only of the following symptoms, and all other reviewed systems are negative.  ALLERGIES: Allergies  Allergen Reactions   Amoxicillin Hives   Penicillins Rash    HOME MEDICATIONS: Outpatient Medications Prior to Visit  Medication Sig Dispense Refill   acetaminophen (TYLENOL) 500 MG tablet Take 1,500 mg by mouth in the morning and at bedtime.     aspirin EC 81 MG tablet Take 1 tablet (81 mg total) by mouth daily. Swallow whole. 30 tablet 11   buPROPion (WELLBUTRIN XL) 150 MG 24 hr tablet Take 150 mg by mouth daily.     busPIRone (BUSPAR) 5 MG tablet Take 5 mg by mouth 3 (three) times daily.     gabapentin (NEURONTIN) 100 MG capsule Take 300 mg by mouth at bedtime.     levonorgestrel (MIRENA, 52 MG,) 20 MCG/24HR  IUD 1 each by Intrauterine route once.     meloxicam (MOBIC) 7.5 MG tablet TAKE 1 TABLET BY MOUTH IN THE MORNING AND AT BEDTIME 60 tablet 5   omeprazole (PRILOSEC) 20 MG capsule Take 20 mg by mouth as needed.     predniSONE (DELTASONE) 10 MG tablet TAKE 1 TABLET BY MOUTH ONCE DAILY AS NEEDED FOR SWELLING IN RIGHT KNEE IF NOT IMPROVED WITH IBUPROFEN 60 tablet 0   RESTASIS 0.05 % ophthalmic emulsion Place 1 drop into both eyes as needed.     traZODone (DESYREL) 50 MG tablet Take 50 mg by mouth at bedtime.     acetaminophen-codeine (TYLENOL #3) 300-30 MG tablet Take 1 tablet by mouth every 6 (six) hours as needed for moderate pain. 30 tablet 0   atorvastatin (LIPITOR) 40 MG tablet Take 40 mg by mouth daily.  lamoTRIgine 50 MG TBDP Take 50 mg by mouth daily. (Patient not taking: Reported on 10/29/2022)     Facility-Administered Medications Prior to Visit  Medication Dose Route Frequency Provider Last Rate Last Admin   sodium chloride flush (NS) 0.9 % injection 16 mL  16 mL Intracatheter PRN Anson Fret, MD        PAST MEDICAL HISTORY: Past Medical History:  Diagnosis Date   Anxiety    Arthritis    Asthma due to environmental allergies    pt states she has never had asthma problems    Depression    DVT (deep venous thrombosis)    post c-section   Migraine    PE (pulmonary embolism)    post c-section   Seizures    as child from severe ear infections- no meds now and no seizures since age 14   Warfarin anticoagulation    took from 03/2010-12/2010.     PAST SURGICAL HISTORY: Past Surgical History:  Procedure Laterality Date   CESAREAN SECTION     CHOLECYSTECTOMY N/A 12/21/2012   Procedure: LAPAROSCOPIC CHOLECYSTECTOMY;  Surgeon: Fabio Bering, MD;  Location: [facility];  Service: General;  Laterality: N/A;   KNEE ARTHROSCOPY WITH MEDIAL MENISECTOMY Right 03/05/2017   Procedure: chondroplasty patella, medial femoral condyle;  Surgeon: Vickki Hearing, MD;  Location: [facility];   Service: Orthopedics;  Laterality: Right;    FAMILY HISTORY: Family History  Problem Relation Age of Onset   Breast cancer Maternal Grandmother    Skin cancer Maternal Grandmother    Cancer Maternal Grandfather    Diabetes Paternal Grandmother    Heart disease Paternal Grandfather    Breast cancer Mother    Aortic aneurysm Father    Stroke Neg Hx        none aware of    Neuropathy Neg Hx        none aware of     SOCIAL HISTORY: Social History   Socioeconomic History   Marital status: Divorced    Spouse name: Not on file   Number of children: 4   Years of education: Not on file   Highest education level: Some college, no degree  Occupational History   Not on file  Tobacco Use   Smoking status: Former    Packs/day: 0.50    Years: 26.00    Additional pack years: 0.00    Total pack years: 13.00    Types: Cigarettes    Quit date: 10/2017    Years since quitting: 5.0   Smokeless tobacco: Never  Vaping Use   Vaping Use: Never used  Substance and Sexual Activity   Alcohol use: Yes    Comment: reports occasional beer but very seldom   Drug use: No   Sexual activity: Yes    Birth control/protection: I.U.D.  Other Topics Concern   Not on file  Social History Narrative   Lives at home with son and boyfriend   Right handed   Caffeine: soda daily   Social Determinants of Health   Financial Resource Strain: Not on file  Food Insecurity: Not on file  Transportation Needs: Not on file  Physical Activity: Not on file  Stress: Not on file  Social Connections: Not on file  Intimate Partner Violence: Not on file      PHYSICAL EXAM  Vitals:   10/29/22 1025  BP: (!) 116/57  Pulse: 73  Weight: 235 lb 12.8 oz (107 kg)  Height: 5\' 9"  (1.753 m)  Body mass index is 34.82 kg/m.  Generalized: Well developed, in no acute distress   Neurological examination  Mentation: Alert oriented to time, place, history taking. Follows all commands speech and language  fluent Cranial nerve II-XII: Pupils were equal round reactive to light. Extraocular movements were full, visual field were full on confrontational test. Facial sensation and strength were normal. Uvula tongue midline. Head turning and shoulder shrug  were normal and symmetric. Motor: The motor testing reveals 5 over 5 strength of all 4 extremities. Good symmetric motor tone is noted throughout.  Sensory: Sensory testing is intact to soft touch on all 4 extremities. No evidence of extinction is noted.  Coordination: Cerebellar testing reveals good finger-nose-finger and heel-to-shin bilaterally.  Gait and station: Gait is normal.    DIAGNOSTIC DATA (LABS, IMAGING, TESTING) - I reviewed patient records, labs, notes, testing and imaging myself where available.  Lab Results  Component Value Date   WBC 6.6 02/26/2017   HGB 14.3 02/26/2017   HCT 40.6 02/26/2017   MCV 95.8 02/26/2017   PLT 182 02/26/2017      Component Value Date/Time   NA 137 02/26/2017 0918   K 3.9 02/26/2017 0918   CL 105 02/26/2017 0918   CO2 27 02/26/2017 0918   GLUCOSE 88 02/26/2017 0918   BUN 6 02/26/2017 0918   CREATININE 0.79 02/26/2017 0918   CREATININE 0.80 12/03/2016 1422   CALCIUM 8.6 (L) 02/26/2017 0918   PROT 6.7 12/03/2016 1422   ALBUMIN 4.1 12/03/2016 1422   AST 14 12/03/2016 1422   ALT 14 12/03/2016 1422   ALKPHOS 51 12/03/2016 1422   BILITOT 0.2 12/03/2016 1422   GFRNONAA >60 02/26/2017 0918   GFRAA >60 02/26/2017 0918   Lab Results  Component Value Date   CHOL 153 05/16/2020   HDL 38 (L) 05/16/2020   LDLCALC 94 05/16/2020   TRIG 112 05/16/2020   CHOLHDL 4.0 05/16/2020   No results found for: "HGBA1C" Lab Results  Component Value Date   VITAMINB12 263 10/01/2016   Lab Results  Component Value Date   TSH 1.242 10/01/2016      ASSESSMENT AND PLAN 47 y.o. year old female  has a past medical history of Anxiety, Arthritis, Asthma due to environmental allergies, Depression, DVT (deep  venous thrombosis), Migraine, PE (pulmonary embolism), Seizures, and Warfarin anticoagulation. here with:  Migraine  - Start Topamax 25 mg at bedtime for 1 week then increase to 50 mg at bedtime - reviewed potential side effects with the patient and provider her info on her AVS - If not helpful- can consider a CGRP - advised we could try Nurtec or ubrelvy for abortive therapy. She will review and let us know what she wants to start. -Advised if she continues to have intermittent numbness and tingling in the hands and feet may consider doing a nerve conduction in the future. -Keep follow-up with Dr. Pearlean Brownie next month and Dr. Lucia Gaskins in July      Butch Penny, MSN, NP-C 10/29/2022, 10:42 AM [facility] [address], [address] [place], [place] [address] [phone]

## 2022-11-04 ENCOUNTER — Telehealth: Payer: Self-pay | Admitting: Neurology

## 2022-11-04 NOTE — Telephone Encounter (Signed)
Sent mychart msg informing pt of appointment change due to provider template change 

## 2022-11-07 ENCOUNTER — Encounter: Payer: Self-pay | Admitting: Neurology

## 2022-11-07 ENCOUNTER — Ambulatory Visit: Payer: Medicaid Other | Admitting: Neurology

## 2022-11-07 VITALS — BP 111/73 | HR 82 | Ht 69.0 in | Wt 234.0 lb

## 2022-11-07 DIAGNOSIS — Z8669 Personal history of other diseases of the nervous system and sense organs: Secondary | ICD-10-CM | POA: Diagnosis not present

## 2022-11-07 DIAGNOSIS — R202 Paresthesia of skin: Secondary | ICD-10-CM

## 2022-11-07 DIAGNOSIS — R2990 Unspecified symptoms and signs involving the nervous system: Secondary | ICD-10-CM | POA: Diagnosis not present

## 2022-11-07 NOTE — Patient Instructions (Addendum)
I had a long discussion with the patient regarding strokelike episode December 2023 with persistent intermittent left hand and feet paresthesias discussed results of available imaging and lab work and questions.  Recommend further evaluation with checking TEE ,MRI of the C-spine, ANA panel, anticardiolipin antibodies .Continue aspirin for stroke prevention and maintain aggressive risk factor modification with strict control hypertension with blood pressure goal below 140/90, lipids with LDL cholesterol goal below 70 mg percent and diabetes with hemoglobin A1c goal below 6.5%.  Follow-up with Dr.Ahern for migraines.

## 2022-11-07 NOTE — Progress Notes (Signed)
[facility] [address] [place]. [place] [address] [phone]       OFFICE CONSULT NOTE  Ms. Jaime Massey Date of Birth:  [birth year] Medical Record Number:  [id number]   Referring MD:  Dr Lucia Gaskins  Reason for Referral: Second opinion  HPI: Ms. Jaime Massey is a 47 year old Caucasian lady with past medical history of pulmonary embolism, DVT, migraine, anxiety depression, arthritis and asthma.  She presented on 06/19/2022 to [facility] for evaluation for episode of sudden onset of facial droop and numbness and tongue deviation.  She was seen by teleneurology and treated with IV TNK with resolution of symptoms.  CT scan as well as CT angiogram unremarkable.  MRI scan was negative for acute ischemia.  Echocardiogram was unremarkable.  Telemetry monitoring did not reveal cardiac arrhythmias.  Hemoglobin A1c was 5.0.  LDL cholesterol 69 mg percent.  She subsequently had outpatient Zio patch monitor for 4 weeks which was negative for significant cardiac arrhythmias.  Transcranial Doppler bubble study on 08/31/2020 was negative.  Patient states she has had no further recurrent episodes of facial droop but does have intermittent numbness in the left hand and left feet off-and-on.  She states this is longstanding problem in the past she had to have nerve conduction studies which were unremarkable.  She is had MRI scan of the brain as well as cervical spine which were negative.  She does have history of migraine headaches and is followed by Dr. Daisy Blossom and Everlene Other nurse practitioner in the office.  She states that headaches are not very bothersome at the present time.  She was on Topamax for prophylaxis but she developed mood swings and discontinued it.  Migraine frequency now is 2-3 times per week but these are mostly mild headaches.  She is also discontinued Lipitor as her cholesterol was not high enough. MRI scan cervical spine at [facility] on 08/31/2020 showed mild  multilevel disc degenerative changes at C3/4 to C6/7 with mild canal stenosis.  No demyelinating lesions were noted.  MRI scan of the brain on 07/19/2022 was normal for age with few nonspecific T2/FLAIR white matter hyperintensities. ROS:   14 system review of systems is positive for numbness, facial weakness, headaches, anxiety, depression, migraines all other systems negative  PMH:  Past Medical History:  Diagnosis Date   Anxiety    Arthritis    Asthma due to environmental allergies    pt states she has never had asthma problems    Depression    DVT (deep venous thrombosis) (HCC)    post c-section   Migraine    PE (pulmonary embolism)    post c-section   Seizures (HCC)    as child from severe ear infections- no meds now and no seizures since age 42   Warfarin anticoagulation    took from 03/2010-12/2010.     Social History:  Social History   Socioeconomic History   Marital status: Divorced    Spouse name: Not on file   Number of children: 4   Years of education: Not on file   Highest education level: Some college, no degree  Occupational History   Not on file  Tobacco Use   Smoking status: Former    Packs/day: 0.50    Years: 26.00    Additional pack years: 0.00    Total pack years: 13.00    Types: Cigarettes    Quit date: 10/2017    Years since quitting: 5.0   Smokeless tobacco: Never  Vaping Use   Vaping Use: Never used  Substance and Sexual Activity   Alcohol use: Yes    Comment: reports occasional beer but very seldom   Drug use: No   Sexual activity: Yes    Birth control/protection: I.U.D.  Other Topics Concern   Not on file  Social History Narrative   Lives at home with son and boyfriend   Right handed   Caffeine: soda daily   Social Determinants of Health   Financial Resource Strain: Not on file  Food Insecurity: Not on file  Transportation Needs: Not on file  Physical Activity: Not on file  Stress: Not on file  Social Connections: Not on file   Intimate Partner Violence: Not on file    Medications:   Current Outpatient Medications on File Prior to Visit  Medication Sig Dispense Refill   aspirin EC 81 MG tablet Take 1 tablet (81 mg total) by mouth daily. Swallow whole. 30 tablet 11   buPROPion (WELLBUTRIN XL) 150 MG 24 hr tablet Take 150 mg by mouth daily.     busPIRone (BUSPAR) 5 MG tablet Take 5 mg by mouth 3 (three) times daily.     gabapentin (NEURONTIN) 100 MG capsule Take 300 mg by mouth at bedtime.     levonorgestrel (MIRENA, 52 MG,) 20 MCG/24HR IUD 1 each by Intrauterine route once.     meloxicam (MOBIC) 7.5 MG tablet TAKE 1 TABLET BY MOUTH IN THE MORNING AND AT BEDTIME 60 tablet 5   omeprazole (PRILOSEC) 20 MG capsule Take 20 mg by mouth as needed.     predniSONE (DELTASONE) 10 MG tablet TAKE 1 TABLET BY MOUTH ONCE DAILY AS NEEDED FOR SWELLING IN RIGHT KNEE IF NOT IMPROVED WITH IBUPROFEN 60 tablet 0   RESTASIS 0.05 % ophthalmic emulsion Place 1 drop into both eyes as needed.     traZODone (DESYREL) 50 MG tablet Take 50 mg by mouth at bedtime.     Current Facility-Administered Medications on File Prior to Visit  Medication Dose Route Frequency Provider Last Rate Last Admin   sodium chloride flush (NS) 0.9 % injection 16 mL  16 mL Intracatheter PRN Anson Fret, MD        Allergies:   Allergies  Allergen Reactions   Amoxicillin Hives   Topiramate     Caused mood swing   Penicillins Rash    Physical Exam General: well developed, well nourished pleasant middle-age mildly obese Caucasian lady, seated, in no evident distress Head: head normocephalic and atraumatic.   Neck: supple with no carotid or supraclavicular bruits Cardiovascular: regular rate and rhythm, no murmurs Musculoskeletal: no deformity Skin:  no rash/petichiae Vascular:  Normal pulses all extremities  Neurologic Exam Mental Status: Awake and fully alert. Oriented to place and time. Recent and remote memory intact. Attention span,  concentration and fund of knowledge appropriate. Mood and affect appropriate.  Cranial Nerves: Fundoscopic exam reveals sharp disc margins. Pupils equal, briskly reactive to light. Extraocular movements full without nystagmus. Visual fields full to confrontation. Hearing intact. Facial sensation intact. Face, tongue, palate moves normally and symmetrically.  Motor: Normal bulk and tone. Normal strength in all tested extremity muscles. Sensory.: intact to touch , pinprick , position and vibratory sensation.  Coordination: Rapid alternating movements normal in all extremities. Finger-to-nose and heel-to-shin performed accurately bilaterally. Gait and Station: Arises from chair without difficulty. Stance is normal. Gait demonstrates normal stride length and balance . Able to heel, toe and tandem walk without difficulty.  Reflexes:  1+ and symmetric. Toes downgoing.   NIHSS  0 Modified Rankin  1   ASSESSMENT: 47 year old Caucasian lady with episode of transient right facial droop, left facial numbness likely strokelike episode treated with IV thrombolysis in December 2023 with negative neuroimaging.  She continues to have intermittent paresthesias of unclear etiology.    I had a long discussion with the patient regarding strokelike episode December 2023 with persistent intermittent left hand and feet paresthesias discussed results of available imaging and lab work and questions.  Recommend further evaluation with checking MRI of the C-spine, ANA panel, anticardiolipin antibodies .Continue aspirin for stroke prevention and maintain aggressive risk factor modification with strict control hypertension with blood pressure goal below 140/90, lipids with LDL cholesterol goal below 70 mg percent and diabetes with hemoglobin A1c goal below 6.5%.  Follow-up with Dr.Ahern for migraines.  Greater than 50% time during this 45-minute consultation visit were spent in counseling and coordination of care about the  episodes of numbness and strokelike episodes and discussing evaluation plan and answering questions. PLAN:   Note: This document was prepared with digital dictation and possible smart phrase technology. Any transcriptional errors that result from this process are unintentional.

## 2022-11-12 LAB — CARDIOLIPIN ANTIBODIES, IGG, IGM, IGA
Anticardiolipin IgA: <9 APL U/mL (ref 0–11)
Anticardiolipin IgG: <9 GPL U/mL (ref 0–14)
Anticardiolipin IgM: <9 MPL U/mL (ref 0–12)

## 2022-11-12 LAB — ANA COMPREHENSIVE PANEL
Anti JO-1: <0.2 AI (ref 0.0–0.9)
Centromere Ab Screen: <0.2 AI (ref 0.0–0.9)
Chromatin Ab SerPl-aCnc: <0.2 AI (ref 0.0–0.9)
ENA RNP Ab: <0.2 AI (ref 0.0–0.9)
ENA SM Ab Ser-aCnc: <0.2 AI (ref 0.0–0.9)
ENA SSA (RO) Ab: <0.2 AI (ref 0.0–0.9)
ENA SSB (LA) Ab: <0.2 AI (ref 0.0–0.9)
Scleroderma (Scl-70) (ENA) Antibody, IgG: <0.2 AI (ref 0.0–0.9)
dsDNA Ab: <1 IU/mL (ref 0–9)

## 2022-11-12 LAB — LYME DISEASE SEROLOGY W/REFLEX: Lyme Total Antibody EIA: NEGATIVE

## 2022-11-14 ENCOUNTER — Telehealth: Payer: Self-pay | Admitting: Neurology

## 2022-11-14 DIAGNOSIS — R29810 Facial weakness: Secondary | ICD-10-CM

## 2022-11-14 DIAGNOSIS — R2990 Unspecified symptoms and signs involving the nervous system: Secondary | ICD-10-CM

## 2022-11-14 DIAGNOSIS — G459 Transient cerebral ischemic attack, unspecified: Secondary | ICD-10-CM

## 2022-11-14 NOTE — Telephone Encounter (Signed)
Pt states she is waiting to be contacted for the scheduling of the scan Dr Pearlean Brownie told her he wanted (one that would require her throat to be numbed so scope could go down throat to view the back of her heart)

## 2022-11-14 NOTE — Telephone Encounter (Signed)
Pt states when She was put on Topamax by Aundra Millet, NP she was only able to take for a week.  Pt states she experienced mood swings and other sider effects.  Pt would like a call to discuss other medication

## 2022-11-18 ENCOUNTER — Telehealth: Payer: Self-pay | Admitting: Neurology

## 2022-11-18 ENCOUNTER — Telehealth (HOSPITAL_BASED_OUTPATIENT_CLINIC_OR_DEPARTMENT_OTHER): Payer: Self-pay | Admitting: Cardiology

## 2022-11-18 DIAGNOSIS — R2990 Unspecified symptoms and signs involving the nervous system: Secondary | ICD-10-CM

## 2022-11-18 DIAGNOSIS — G459 Transient cerebral ischemic attack, unspecified: Secondary | ICD-10-CM

## 2022-11-18 DIAGNOSIS — R202 Paresthesia of skin: Secondary | ICD-10-CM

## 2022-11-18 NOTE — Telephone Encounter (Signed)
For echo TEE I was advised to have the patient call [phone] to get scheduled.

## 2022-11-18 NOTE — Telephone Encounter (Signed)
Jaime Massey schedules the carotid ultrasounds and the regular echos at the hospital and she said she doesn't know who schedules the echo TEE. The phone number I was given was provided by  Judeth Horn MSN Ed, RN,RCIS  [facility], Assistant Director  I replied to a mass email that I was attached to for some reason this morning.

## 2022-11-18 NOTE — Telephone Encounter (Signed)
I contacted number that was provided to patient and she stated they do regular TEE not echo and was unaware of who does it. Tylene Fantasia advised Tori to reach to the person that carotid ultrasound and see who does the Echo TEE.

## 2022-11-18 NOTE — Telephone Encounter (Signed)
Did you call pt?

## 2022-11-18 NOTE — Telephone Encounter (Signed)
Insurance will not approve cervical spine MRI without her first completing 6 weeks of therapy.

## 2022-11-18 NOTE — Telephone Encounter (Signed)
Called pt concerning a new pt evaluation for Stroke work up. Pt will see Dr. Cristal Deer on 5/20 @9 :20 am for evaluation and to set up TEE. TEE requested by Dr. Pearlean Brownie.

## 2022-11-18 NOTE — Telephone Encounter (Signed)
Tori provided number  to patient to call and schedule

## 2022-11-18 NOTE — Telephone Encounter (Signed)
Pt is calling. Stated someone sent her a message in [place] to call and schedule her ECHO Tee. Stated when she called she was told the Dr office is supposed to call and schedule for pt.

## 2022-11-18 NOTE — Telephone Encounter (Signed)
This pt is asking to be given a valid # to call for the scheduling of her Echo Tee.  Pt was given [phone].  The person she reached told her they were unfamiliar with Echo Tee's and that her # is not the right one to be called.

## 2022-11-19 NOTE — Telephone Encounter (Signed)
The patient called and scheduled the MRI herself at [facility] so I am letting her know it was denied by her insurance.

## 2022-11-19 NOTE — Telephone Encounter (Signed)
Noted  

## 2022-11-19 NOTE — Telephone Encounter (Signed)
Jaime Massey spoke to someone who stated patient has to be seen by cardio for evaluation prior to completing TEE. Per Cardio TE 5/13 "Jaime Massey Called pt concerning a new pt evaluation for Stroke work up. Pt will see Dr. Cristal Deer on 5/20 @9 :20 am for evaluation and to set up TEE. TEE requested by Dr. Pearlean Brownie"

## 2022-11-20 NOTE — Telephone Encounter (Signed)
Dr. Pearlean Brownie, I assume you are ok with her having PT on her neck? Insurance requires it before MRI.  If so, diamond please call patient back and order PT for her cervical spine at the location she would prefer thanks

## 2022-11-21 NOTE — Addendum Note (Signed)
Addended by: Jacqualine Code D on: 11/21/2022 02:02 PM   Modules accepted: Orders

## 2022-11-22 ENCOUNTER — Ambulatory Visit (HOSPITAL_COMMUNITY): Payer: Medicaid Other | Attending: Neurology

## 2022-11-22 DIAGNOSIS — R2689 Other abnormalities of gait and mobility: Secondary | ICD-10-CM | POA: Insufficient documentation

## 2022-11-22 DIAGNOSIS — R2990 Unspecified symptoms and signs involving the nervous system: Secondary | ICD-10-CM | POA: Insufficient documentation

## 2022-11-22 DIAGNOSIS — R202 Paresthesia of skin: Secondary | ICD-10-CM | POA: Diagnosis not present

## 2022-11-22 DIAGNOSIS — R262 Difficulty in walking, not elsewhere classified: Secondary | ICD-10-CM

## 2022-11-22 NOTE — Therapy (Addendum)
OUTPATIENT PHYSICAL THERAPY NEURO EVALUATION   Patient Name: Jaime Massey MRN: [id number] DOB:[birth year], 47 y.o., female Today's Date: 11/22/2022   PCP: [name], DO REFERRING PROVIDER:  Micki Riley, MD [facility] none    END OF SESSION:  PT End of Session - 11/22/22 0911     Visit Number 1    Number of Visits 6    Date for PT Re-Evaluation 01/03/23    Authorization Type Medicaid; [employer] please check auth request    PT Start Time [id number]    PT Stop Time 0815    PT Time Calculation (min) 37 min    Activity Tolerance Patient tolerated treatment well    Behavior During Therapy WFL for tasks assessed/performed             Past Medical History:  Diagnosis Date   Anxiety    Arthritis    Asthma due to environmental allergies    pt states she has never had asthma problems    Depression    DVT (deep venous thrombosis) (HCC)    post c-section   Migraine    PE (pulmonary embolism)    post c-section   Seizures (HCC)    as child from severe ear infections- no meds now and no seizures since age 40   Warfarin anticoagulation    took from 03/2010-12/2010.    Past Surgical History:  Procedure Laterality Date   CESAREAN SECTION     CHOLECYSTECTOMY N/A 12/21/2012   Procedure: LAPAROSCOPIC CHOLECYSTECTOMY;  Surgeon: Fabio Bering, MD;  Location: [facility];  Service: General;  Laterality: N/A;   KNEE ARTHROSCOPY WITH MEDIAL MENISECTOMY Right 03/05/2017   Procedure: chondroplasty patella, medial femoral condyle;  Surgeon: Vickki Hearing, MD;  Location: [facility];  Service: Orthopedics;  Laterality: Right;   Patient Active Problem List   Diagnosis Date Noted   Acute CVA (cerebrovascular accident) (HCC) 06/19/2022   History of pulmonary embolus (PE) 06/19/2022   Spinal stenosis of cervical region 08/30/2020   Numbness and tingling of right arm and leg 07/31/2020   S/P knee surgery patella chondroplasty 03/05/17 02/12/2018   Obesity 12/19/2017    Chondromalacia of lateral femoral condyle, right    Chondromalacia of medial condyle of right femur    Chondromalacia patellae of right knee    Rupture of anterior cruciate ligament of right knee    Cyst of anterior horn of lateral meniscus of right knee    Adjustment disorder with anxious mood 02/15/2017   Bandemia without diagnosis of specific infection 02/15/2017   Pulmonary embolus (HCC) 02/15/2017   FUO (fever of unknown origin) 02/15/2017   New daily persistent headache 02/15/2017   Arthritis of knee, right 12/26/2016   Bone spur 12/03/2016   Fever of unknown origin 10/01/2016    ONSET DATE: 06/19/2022  REFERRING DIAG: R29.90 (ICD-10-CM) - Stroke-like episode R20.2 (ICD-10-CM) - Paresthesia  THERAPY DIAG:  Difficulty in walking, not elsewhere classified - Plan: PT plan of care cert/re-cert  Other abnormalities of gait and mobility - Plan: PT plan of care cert/re-cert  Rationale for Evaluation and Treatment: Rehabilitation  SUBJECTIVE:  SUBJECTIVE STATEMENT: Patient with facial droop right side went to [facility]; TPA resolved symptoms the next day and she was discharged the next day; her neurologist Aheron referred to [facility]; wants to do MRI of the spine but needs a course of therapy first; sees cardiologist Monday;   residual paraesthesia left hand and foot; foot goes numb every day;  Pt accompanied by: self  PERTINENT HISTORY:  2021 migraine with aura that may have been stroke? But was right side symptoms at that time  valve leaking heart Bilateral knee OA  PAIN:  Are you having pain? Yes: NPRS scale: 8/10 Pain location: knees Pain description: arthritis Aggravating factors: standing, walking Relieving factors: shots  PRECAUTIONS: None  WEIGHT BEARING RESTRICTIONS:  No  FALLS: Has patient fallen in last 6 months? No  PLOF: Independent  PATIENT GOALS: return to normal  OBJECTIVE:    COGNITION:Within functional limits for tasks assessed   DIAGNOSTIC FINDINGS:   CLINICAL DATA:  47 year old female status post code stroke presentation last month. TIA, morning headaches, tongue deviation, left side numbness, facial droop.   EXAM: MRI HEAD WITHOUT AND WITH CONTRAST   TECHNIQUE: Multiplanar, multiecho pulse sequences of the brain and surrounding structures were obtained without and with intravenous contrast.   CONTRAST:  10mL GADAVIST GADOBUTROL 1 MMOL/ML IV SOLN   COMPARISON:  Brain MRI, CTA and CT head and neck 06/19/2022.   FINDINGS: Brain: Cerebral volume is stable and within normal limits. Mild asymmetry of the lateral ventricles is most likely normal anatomic variation.   No restricted diffusion to suggest acute infarction. No midline shift, mass effect, evidence of mass lesion, ventriculomegaly, extra-axial collection or acute intracranial hemorrhage. Cervicomedullary junction and pituitary are within normal limits.   [name] Minimal (series 15, image 26), nonspecific white matter T2 and FLAIR hyperintensity extent is normal for this age group. No cortical encephalomalacia or chronic cerebral blood products identified.   No abnormal enhancement identified. No dural thickening or enhancement.   Vascular: Major intracranial vascular flow voids are stable. Major dural venous sinuses are enhancing and appear to be patent.   Skull and upper cervical spine: Stable visible cervical spine with mild C3-C4 disc degeneration, spondylolisthesis. Visualized bone marrow signal is within normal limits.   Sinuses/Orbits: Stable, negative.   Other: Mastoids remain clear. Visible internal auditory structures appear normal. Negative  visible scalp and face.   IMPRESSION: Stable and normal for age MRI appearance of the Brain.      SENSATION: Reports of numbness left hand and foot  COORDINATION: Nose to finger   EDEMA:  Both feet swell   MUSCLE TONE: wfl    DTRs:  Not tested at eval  POSTURE: rounded shoulders and forward head  LOWER EXTREMITY ROM:     Active  Right Eval Left Eval  Hip flexion    Hip extension    Hip abduction    Hip adduction    Hip internal rotation    Hip external rotation    Knee flexion    Knee extension    Ankle dorsiflexion    Ankle plantarflexion    Ankle inversion    Ankle eversion     (Blank rows = not tested) UPPER AND LOWER EXTREMITY MMT:        MMT Right Eval Left Eval  Shoulder flexion  4+ 4+  Bicep (elbow flexion) 5 4  Tricep (elbow extension) 5  4  Hip flexion 4+ 4+  Hip extension    Hip abduction    Hip adduction    Hip internal rotation    Hip external rotation    Knee flexion    Knee extension 4- (OA knee) 5  Ankle dorsiflexion 4+ 4+  Ankle plantarflexion    Ankle inversion    Ankle eversion    (Blank rows = not tested)  TRANSFERS: Assistive device utilized: None  Sit to stand: Modified independence Stand to sit: Modified independence Chair to chair: Modified independence Floor:  not tested   STAIRS: Level of Assistance: Modified independence Stair Negotiation Technique: Alternating Pattern  with No Rails Number of Stairs: 4-8  Height of Stairs: 4" to 8"  Comments: more difficulty managing descending 8" steps  GAIT: Gait pattern: WFL Distance walked: 100 ft Assistive device utilized: None Level of assistance: Modified independence Comments: slight decreased gait speed  FUNCTIONAL TESTS:  5 times sit to stand: 17.54 sec Dynamic Gait Index: 17/24 SLS Right 18 sec; left 3 sec  DGI 1. Gait level surface (3) Normal: Walks 20', no assistive devices, good sped, no evidence for imbalance, normal gait pattern 2. Change in gait  speed (2) Mild Impairment: Is able to change speed but demonstrates mild gait deviations, or not gait deviations but unable to achieve a significant change in velocity, or uses an assistive device. 3. Gait with horizontal head turns (2) Mild Impairment: Performs head turns smoothly with slight change in gait velocity, i.e., minor disruption to smooth gait path or uses walking aid. 4. Gait with vertical head turns (2) Mild Impairment: Performs head turns smoothly with slight change in gait velocity, i.e., minor disruption to smooth gait path or uses walking aid. 5. Gait and pivot turn (2) Mild Impairment: Pivot turns safely in > 3 seconds and stops with no loss of balance. 6. Step over obstacle (2) Mild Impairment: Is able to step over box, but must slow down and adjust steps to clear box safely. 7. Step around obstacles (2) Mild Impairment: Is able to step around both cones, but must slow down and adjust steps to clear cones. 8. Stairs (2) Mild Impairment: Alternating feet, must use rail.  TOTAL SCORE: 17 / 24   PATIENT SURVEYS:  Stroke Impact Scale 263  TODAY'S TREATMENT:                                                                                                                              DATE: 11/22/22 physical therapy evaluation and HEP    PATIENT EDUCATION: Education details: Patient educated on exam findings, POC, scope of PT, HEP, and what to expect next visit. Person educated: Patient Education method: Explanation, Demonstration, and Handouts Education comprehension: verbalized understanding, returned demonstration, verbal cues required, and tactile cues required  HOME EXERCISE PROGRAM: Access Code: [email or web address] URL: [email or web address] Date: 11/22/2022 Prepared by: [facility]  Exercises - Single Leg Stance  - 2-3 x daily -  7 x weekly - 1 sets - 10 reps - 30 sec hold  GOALS: Goals reviewed with patient? No  SHORT TERM GOALS: Target date:  12/13/2022  patient will be independent with initial HEP   Baseline: Goal status: INITIAL  2.  Patient will self report 30% improvement to improve tolerance for functional activity  Baseline:  Goal status: INITIAL  LONG TERM GOALS: Target date: 01/03/2023  Patient will be independent in self management strategies to improve quality of life and functional outcomes.  Baseline:  Goal status: INITIAL  2.  Patient will self report 50% improvement to improve tolerance for functional activity  Baseline:  Goal status: INITIAL  3.  Patient will be able to stand on each leg 30" SLS to demonstrate improved functional balance Baseline: right 18"; left 3 " Goal status: INITIAL  4.  Patient will improve DGI to 20/24 to demonstrate improved functional gait and balance Baseline: 17/24 Goal status: INITIAL   ASSESSMENT:  CLINICAL IMPRESSION: Patient is a 47 y.o. female who was seen today for physical therapy evaluation and treatment for R29.90 (ICD-10-CM) - Stroke-like episode R20.2 (ICD-10-CM) - Paresthesia. Patient demonstrates decreased strength, balance deficits and gait abnormalities which are negatively impacting patient ability to perform ADLs and functional mobility tasks. Patient will benefit from skilled physical therapy services to address these deficits to improve level of function with ADLs, functional mobility tasks, and reduce risk for falls.    OBJECTIVE IMPAIRMENTS: Abnormal gait, decreased activity tolerance, decreased balance, decreased endurance, difficulty walking, decreased strength, impaired perceived functional ability, and pain.   ACTIVITY LIMITATIONS: carrying, lifting, bending, sitting, standing, squatting, sleeping, stairs, locomotion level, and caring for others  PARTICIPATION LIMITATIONS: meal prep, cleaning, laundry, shopping, community activity, occupation, and yard work    [facility] POTENTIAL: Good  CLINICAL DECISION MAKING: Evolving/moderate  complexity  EVALUATION COMPLEXITY: Moderate  PLAN:  PT FREQUENCY: 1x/week  PT DURATION: 6 weeks  PLANNED INTERVENTIONS: Therapeutic exercises, Therapeutic activity, Neuromuscular re-education, Balance training, Gait training, Patient/Family education, Joint manipulation, Joint mobilization, Stair training, Orthotic/Fit training, DME instructions, Aquatic Therapy, Dry Needling, Electrical stimulation, Spinal manipulation, Spinal mobilization, Cryotherapy, Moist heat, Compression bandaging, scar mobilization, Splintting, Taping, Traction, Ultrasound, Ionotophoresis 4mg /ml Dexamethasone, and Manual therapy   PLAN FOR NEXT SESSION: Review HEP and goals; higher level balance and gait training; general strengthening   9:12 AM, 11/22/22 Maximilliano Kersh Small Krisalyn Yankowski MPT [facility] [place] [phone] Ph:[phone]

## 2022-11-22 NOTE — Progress Notes (Signed)
Kindly inform the patient that blood work for anticardiolipin antibodies, Lyme disease and lupus and autoimmune inflammatory conditions was all normal

## 2022-11-25 ENCOUNTER — Encounter (HOSPITAL_BASED_OUTPATIENT_CLINIC_OR_DEPARTMENT_OTHER): Payer: Self-pay | Admitting: Cardiology

## 2022-11-25 ENCOUNTER — Ambulatory Visit (INDEPENDENT_AMBULATORY_CARE_PROVIDER_SITE_OTHER): Payer: Medicaid Other | Admitting: Cardiology

## 2022-11-25 VITALS — BP 110/70 | HR 70 | Ht 69.0 in | Wt 236.2 lb

## 2022-11-25 DIAGNOSIS — Z7189 Other specified counseling: Secondary | ICD-10-CM

## 2022-11-25 DIAGNOSIS — Z8249 Family history of ischemic heart disease and other diseases of the circulatory system: Secondary | ICD-10-CM

## 2022-11-25 DIAGNOSIS — I639 Cerebral infarction, unspecified: Secondary | ICD-10-CM | POA: Diagnosis not present

## 2022-11-25 NOTE — Progress Notes (Signed)
Cardiology Office Note:    Date:  11/25/2022   ID:  Jaime Massey, DOB [birth year], MRN [id number]  PCP:  Jaime Lunger, DO  Cardiologist:  Jaime Red, MD  Referring MD: Jaime Lunger, DO   CC: new patient consultation for stroke and TEE   History of Present Illness:    Jaime Massey is a 47 y.o. female with a hx of CVA treated with TNK 06/2022 who is seen as a new consult at the request of Jaime Lunger, DO for the evaluation and management of history of CVA, request for TEE.  Notes reviewed. Recently seen by Dr. Pearlean Massey on 11/07/22 for CVA history, recommended TEE for further evaluation.  History: -CVA 06/2022, [facility]: facial droop, numbness, tongue deviation. Treated with TNK with resolution of symptoms. Workup including CT angiogram, MRI, echo, A1c, cholesterol, event monitor, and transcranial dopplers was unremarkable.  -persistent left hand/left foot paresthesias  Cardiovascular risk factors: Prior clinical ASCVD: CVA as above Comorbid conditions: Denies hypertension, hyperlipidemia, diabetes, chronic kidney disease  Metabolic syndrome/Obesity: highest adult weight is current weight. Chronic inflammatory conditions: none that she knows of Tobacco use history: former, quit 2019 Family history: grandfather had first MI age 70, had several more, passed away late 50s/early 23s. Father has an aortic aneurysm, has been monitored for the last 14 years without growing. No other known heart disease. Great aunt had a stroke. Prior pertinent testing and/or incidental findings: see summary above Exercise level:gets shortness of breath with significant activity, but not limited. Current diet: breakfast and lunch can be variable, eats biscuit/sandwich. Past six weeks has been especially difficult as her father has been in/out of hospital and SNF.  Has significant stress in her life currently.  Denies shortness of breath at rest. No PND, orthopnea, LE edema or  unexpected weight gain. No syncope or palpitations.   Past Medical History:  Diagnosis Date   Anxiety    Arthritis    Asthma due to environmental allergies    pt states she has never had asthma problems    Depression    DVT (deep venous thrombosis) (HCC)    post c-section   Migraine    PE (pulmonary embolism)    post c-section   Seizures (HCC)    as child from severe ear infections- no meds now and no seizures since age 68   Warfarin anticoagulation    took from 03/2010-12/2010.     Past Surgical History:  Procedure Laterality Date   CESAREAN SECTION     CHOLECYSTECTOMY N/A 12/21/2012   Procedure: LAPAROSCOPIC CHOLECYSTECTOMY;  Surgeon: Jaime Bering, MD;  Location: [facility];  Service: General;  Laterality: N/A;   KNEE ARTHROSCOPY WITH MEDIAL MENISECTOMY Right 03/05/2017   Procedure: chondroplasty patella, medial femoral condyle;  Surgeon: Jaime Hearing, MD;  Location: [facility];  Service: Orthopedics;  Laterality: Right;    Current Medications: Current Outpatient Medications on File Prior to Visit  Medication Sig   aspirin EC 81 MG tablet Take 1 tablet (81 mg total) by mouth daily. Swallow whole.   buPROPion (WELLBUTRIN XL) 150 MG 24 hr tablet Take 150 mg by mouth daily.   busPIRone (BUSPAR) 5 MG tablet Take 5 mg by mouth 3 (three) times daily.   gabapentin (NEURONTIN) 100 MG capsule Take 300 mg by mouth at bedtime.   levonorgestrel (MIRENA, 52 MG,) 20 MCG/24HR IUD 1 each by Intrauterine route once.   meloxicam (MOBIC) 7.5 MG tablet TAKE 1 TABLET BY MOUTH IN THE MORNING AND  AT BEDTIME   omeprazole (PRILOSEC) 20 MG capsule Take 20 mg by mouth as needed.   predniSONE (DELTASONE) 10 MG tablet TAKE 1 TABLET BY MOUTH ONCE DAILY AS NEEDED FOR SWELLING IN RIGHT KNEE IF NOT IMPROVED WITH IBUPROFEN   RESTASIS 0.05 % ophthalmic emulsion Place 1 drop into both eyes as needed.   traZODone (DESYREL) 50 MG tablet Take 50 mg by mouth at bedtime.   Current Facility-Administered Medications  on File Prior to Visit  Medication   sodium chloride flush (NS) 0.9 % injection 16 mL     Allergies:   Amoxicillin, Topiramate, and Penicillins   Social History   Tobacco Use   Smoking status: Former    Packs/day: 0.50    Years: 26.00    Additional pack years: 0.00    Total pack years: 13.00    Types: Cigarettes    Quit date: 10/2017    Years since quitting: 5.1   Smokeless tobacco: Never  Vaping Use   Vaping Use: Never used  Substance Use Topics   Alcohol use: Yes    Comment: reports occasional beer but very seldom   Drug use: No    Family History: family history includes Aortic aneurysm in her father; Breast cancer in her maternal grandmother and mother; Cancer in her maternal grandfather; Diabetes in her paternal grandmother; Heart disease in her paternal grandfather; Skin cancer in her maternal grandmother. There is no history of Stroke or Neuropathy.  ROS:   Please see the history of present illness.  Additional pertinent ROS: Constitutional: Negative for chills, fever, night sweats, unintentional weight loss  HENT: Negative for ear pain and Massey loss.   Eyes: Negative for loss of vision and eye pain.  Respiratory: Negative for cough, sputum, wheezing.   Cardiovascular: See HPI. Gastrointestinal: Negative for abdominal pain, melena, and hematochezia.  Genitourinary: Negative for dysuria and hematuria.  Musculoskeletal: Negative for falls and myalgias.  Skin: Negative for itching and rash.  Neurological: Negative for focal weakness, focal sensory changes and loss of consciousness.  Endo/Heme/Allergies: Does not bruise/bleed easily.     EKGs/Labs/Other Studies Reviewed:    The following studies were reviewed today: Cardiac Studies & Procedures       ECHOCARDIOGRAM  ECHOCARDIOGRAM COMPLETE BUBBLE STUDY 06/12/2020  Narrative ECHOCARDIOGRAM REPORT    Patient Name:   Jaime Massey Date of Exam: 06/12/2020 Medical Rec #:  [id number]       Height:        69.0 in Accession #:    [id number]      Weight:       232.0 lb Date of Birth:  1975-10-22       BSA:          2.201 m Patient Age:    44 years        BP:           132/79 mmHg Patient Gender: F               HR:           73 bpm. Exam Location:  [address]  Procedure: 2D Echo, Cardiac Doppler, Color Doppler and Saline Contrast Bubble Study  Indications:     I63.9 Stroke  History:         Patient has no prior history of Echocardiogram examinations. Pulmonary embolism; Signs/Symptoms:Numbness.  Sonographer:     Clearence Ped RCS Referring Phys:  [id number] Anson Fret Diagnosing Phys: Marca Ancona MD  IMPRESSIONS   1.  Left ventricular ejection fraction, by estimation, is 55%. The left ventricle has normal function. The left ventricle has no regional wall motion abnormalities. Left ventricular diastolic parameters were normal. 2. Right ventricular systolic function is normal. The right ventricular size is normal. Tricuspid regurgitation signal is inadequate for assessing PA pressure. 3. The mitral valve is normal in structure. Trivial mitral valve regurgitation. 4. The aortic valve is tricuspid. Aortic valve regurgitation is not visualized. 5. The inferior vena cava is normal in size with greater than 50% respiratory variability, suggesting right atrial pressure of 3 mmHg. 6. Negative bubble study, no PFO or ASD.  FINDINGS Left Ventricle: Left ventricular ejection fraction, by estimation, is 55%. The left ventricle has normal function. The left ventricle has no regional wall motion abnormalities. The left ventricular internal cavity size was normal in size. There is no left ventricular hypertrophy. Left ventricular diastolic parameters were normal.  Right Ventricle: The right ventricular size is normal. No increase in right ventricular wall thickness. Right ventricular systolic function is normal. Tricuspid regurgitation signal is inadequate for assessing PA pressure.  Left  Atrium: Left atrial size was normal in size.  Right Atrium: Right atrial size was normal in size.  Pericardium: There is no evidence of pericardial effusion.  Mitral Valve: The mitral valve is normal in structure. Trivial mitral valve regurgitation.  Tricuspid Valve: The tricuspid valve is normal in structure. Tricuspid valve regurgitation is not demonstrated.  Aortic Valve: The aortic valve is tricuspid. Aortic valve regurgitation is not visualized.  Pulmonic Valve: The pulmonic valve was normal in structure. Pulmonic valve regurgitation is not visualized.  Aorta: The aortic root is normal in size and structure.  Venous: The inferior vena cava is normal in size with greater than 50% respiratory variability, suggesting right atrial pressure of 3 mmHg.  IAS/Shunts: Negative bubble study, no PFO or ASD. Agitated saline contrast was given intravenously to evaluate for intracardiac shunting.   LEFT VENTRICLE PLAX 2D LVIDd:         5.00 cm  Diastology LVIDs:         3.70 cm  LV e' medial:    8.38 cm/s LV PW:         1.00 cm  LV E/e' medial:  10.3 LV IVS:        0.80 cm  LV e' lateral:   11.90 cm/s LVOT diam:     2.10 cm  LV E/e' lateral: 7.2 LV SV:         57 LV SV Index:   26 LVOT Area:     3.46 cm   RIGHT VENTRICLE RV Basal diam:  3.10 cm RV S prime:     14.70 cm/s  LEFT ATRIUM             Index       RIGHT ATRIUM           Index LA diam:        3.90 cm 1.77 cm/m  RA Area:     10.70 cm LA Vol (A2C):   40.5 ml 18.40 ml/m RA Volume:   21.50 ml  9.77 ml/m LA Vol (A4C):   29.9 ml 13.59 ml/m LA Biplane Vol: 36.3 ml 16.50 ml/m AORTIC VALVE LVOT Vmax:   72.50 cm/s LVOT Vmean:  48.800 cm/s LVOT VTI:    0.164 m  AORTA Ao Root diam: 3.00 cm Ao Asc diam:  2.60 cm  MITRAL VALVE MV Area (PHT):  SHUNTS MV Decel Time:             Systemic VTI:  0.16 m MV E velocity: 86.10 cm/s  Systemic Diam: 2.10 cm MV A velocity: 72.00 cm/s MV E/A ratio:  1.20  Marca Ancona MD Electronically signed by Marca Ancona MD Signature Date/Time: 06/12/2020/3:43:08 PM    Final (Updated)    MONITORS  CARDIAC EVENT MONITOR 09/14/2020  Narrative Sinus rhythm No afib No sustained arrhythmias Baseline artifact limits interpretation at times            EKG:  EKG is personally reviewed.   11/25/2022: NSR at 70 bpm  Recent Labs: No results found for requested labs within last 365 days.  Recent Lipid Panel    Component Value Date/Time   CHOL 153 05/16/2020 1001   TRIG 112 05/16/2020 1001   HDL 38 (L) 05/16/2020 1001   CHOLHDL 4.0 05/16/2020 1001   LDLCALC 94 05/16/2020 1001    Physical Exam:    VS:  BP 110/70 (BP Location: Left Arm, Patient Position: Sitting, Cuff Size: Large)   Pulse 70   Ht 5\' 9"  (1.753 m)   Wt 236 lb 3.2 oz (107.1 kg)   BMI 34.88 kg/m     Wt Readings from Last 3 Encounters:  11/25/22 236 lb 3.2 oz (107.1 kg)  11/07/22 234 lb (106.1 kg)  10/29/22 235 lb 12.8 oz (107 kg)    GEN: Well nourished, well developed in no acute distress HEENT: Normal, moist mucous membranes NECK: No JVD CARDIAC: regular rhythm, normal S1 and S2, no rubs or gallops. No murmur. VASCULAR: Radial and DP pulses 2+ bilaterally. No carotid bruits RESPIRATORY:  Clear to auscultation without rales, wheezing or rhonchi  ABDOMEN: Soft, non-tender, non-distended MUSCULOSKELETAL:  Ambulates independently SKIN: Warm and dry, no edema NEUROLOGIC:  Alert and oriented x 3. No focal neuro deficits noted. PSYCHIATRIC:  Normal affect    ASSESSMENT:    1. Cerebrovascular accident (CVA), unspecified mechanism (HCC)   2. Family history of heart disease   3. Cardiac risk counseling   4. Counseling on health promotion and disease prevention   5. Encounter to discuss procedure    PLAN:    CVA -06/2022, treated with TNK -negative workup to date -recommended for TEE. Discussed procedure at length today  Shared Decision Making/Informed Consent The risks  [esophageal damage, perforation (1:10,000 risk), bleeding, pharyngeal hematoma as well as other potential complications associated with conscious sedation including aspiration, arrhythmia, respiratory failure and death], benefits (treatment guidance and diagnostic support) and alternatives of a transesophageal echocardiogram were discussed in detail with Ms. Cedotal and she is willing to proceed.    ASCVD history CVA -recommend Wegovy to decrease future CVD risk, discuss at follow up  Cardiac risk counseling and prevention recommendations: -recommend heart healthy/Mediterranean diet, with whole grains, fruits, vegetable, fish, lean meats, nuts, and olive oil. Limit salt. -recommend moderate walking, 3-5 times/week for 30-50 minutes each session. Aim for at least 150 minutes.week. Goal should be pace of 3 miles/hours, or walking 1.5 miles in 30 minutes -recommend avoidance of tobacco products. Avoid excess alcohol. -ASCVD risk score: The ASCVD Risk score (Arnett DK, et al., 2019) failed to calculate for the following reasons:   The patient has a prior MI or stroke diagnosis    Plan for follow up: 3 mos  Jaime Red, MD, PhD, [facility]  [facility]  [place]  [facility] at [facility] at [facility] [address], [address] [place], [place] [address] [phone]   Medication Adjustments/Labs and Tests Ordered: Current medicines are reviewed at length with the patient today.  Concerns regarding medicines are outlined above.  Orders Placed This Encounter  Procedures   Basic metabolic panel   CBC   EKG 12-Lead   No orders of the defined types were placed in this encounter.   Patient Instructions  Medication Instructions:  Your physician recommends that you continue on your current medications as directed. Please refer to the Current Medication list given to you today.  *If you need a refill on your cardiac medications before your  next appointment, please call your pharmacy*   Lab Work: Your physician recommends that you return for lab work today- BMP and CBC   If you have labs (blood work) drawn today and your tests are completely normal, you will receive your results only by: MyChart Message (if you have MyChart) OR A paper copy in the mail If you have any lab test that is abnormal or we need to change your treatment, we will call you to review the results.   Testing/Procedures: You are scheduled for a TEE (Transesophageal Echocardiogram) on Thursday, May 23 with Dr. Cristal Deer.  Please arrive at the [facility] (Main Entrance A) at [facility]: [address] [place], [place] [address] at 12:00 PM (This time is 1 hour(s) before your procedure to ensure your preparation). Free valet parking service is available. You will check in at ADMITTING. The support person will be asked to wait in the waiting room.  It is OK to have someone drop you off and come back when you are ready to be discharged.     DIET:  Nothing to eat or drink after midnight except a sip of water with medications (see medication instructions below)  LABS: Labs done at OV 5/20  FYI:  For your safety, and to allow [place] to monitor your vital signs accurately during the surgery/procedure we request: If you have artificial nails, gel coating, SNS etc, please have those removed prior to your surgery/procedure. Not having the nail coverings /polish removed may result in cancellation or delay of your surgery/procedure.  You must have a responsible person to drive you home and stay in the waiting area during your procedure. Failure to do so could result in cancellation.  Bring your insurance cards.  *Special Note: Every effort is made to have your procedure done on time. Occasionally there are emergencies that occur at the hospital that may cause delays. Please be patient if a delay does occur.   Follow-Up: At [facility], you and  your health needs are our priority.  As part of our continuing mission to provide you with exceptional heart care, we have created designated Provider Care Teams.  These Care Teams include your primary Cardiologist (physician) and Advanced Practice Providers (APPs -  Physician Assistants and Nurse Practitioners) who all work together to provide you with the care you need, when you need it.  We recommend signing up for the patient portal called "MyChart".  Sign up information is provided on this After Visit Summary.  MyChart is used to connect with patients for Virtual Visits (Telemedicine).  Patients are able to view lab/test results, encounter notes, upcoming appointments, etc.  Non-urgent messages can be sent to your provider as well.   To learn more about what you can do with MyChart, go to [email or web address].    Your next appointment:   3 month(s)  Provider:  Jodelle Red, MD or Gillian Shields, NP     Signed, Jaime Red, MD PhD 11/25/2022 10:08 AM    [facility]

## 2022-11-25 NOTE — Patient Instructions (Signed)
Medication Instructions:  Your physician recommends that you continue on your current medications as directed. Please refer to the Current Medication list given to you today.  *If you need a refill on your cardiac medications before your next appointment, please call your pharmacy*   Lab Work: Your physician recommends that you return for lab work today- BMP and CBC   If you have labs (blood work) drawn today and your tests are completely normal, you will receive your results only by: MyChart Message (if you have MyChart) OR A paper copy in the mail If you have any lab test that is abnormal or we need to change your treatment, we will call you to review the results.   Testing/Procedures: You are scheduled for a TEE (Transesophageal Echocardiogram) on Thursday, May 23 with Dr. Cristal Deer.  Please arrive at the [facility] (Main Entrance A) at [facility]: [address] [place], [place] [address] at 12:00 PM (This time is 1 hour(s) before your procedure to ensure your preparation). Free valet parking service is available. You will check in at ADMITTING. The support person will be asked to wait in the waiting room.  It is OK to have someone drop you off and come back when you are ready to be discharged.     DIET:  Nothing to eat or drink after midnight except a sip of water with medications (see medication instructions below)  LABS: Labs done at OV 5/20  FYI:  For your safety, and to allow [place] to monitor your vital signs accurately during the surgery/procedure we request: If you have artificial nails, gel coating, SNS etc, please have those removed prior to your surgery/procedure. Not having the nail coverings /polish removed may result in cancellation or delay of your surgery/procedure.  You must have a responsible person to drive you home and stay in the waiting area during your procedure. Failure to do so could result in cancellation.  Bring your insurance cards.  *Special  Note: Every effort is made to have your procedure done on time. Occasionally there are emergencies that occur at the hospital that may cause delays. Please be patient if a delay does occur.   Follow-Up: At [facility], you and your health needs are our priority.  As part of our continuing mission to provide you with exceptional heart care, we have created designated Provider Care Teams.  These Care Teams include your primary Cardiologist (physician) and Advanced Practice Providers (APPs -  Physician Assistants and Nurse Practitioners) who all work together to provide you with the care you need, when you need it.  We recommend signing up for the patient portal called "MyChart".  Sign up information is provided on this After Visit Summary.  MyChart is used to connect with patients for Virtual Visits (Telemedicine).  Patients are able to view lab/test results, encounter notes, upcoming appointments, etc.  Non-urgent messages can be sent to your provider as well.   To learn more about what you can do with MyChart, go to [email or web address].    Your next appointment:   3 month(s)  Provider:   Jodelle Red, MD or Gillian Shields, NP

## 2022-11-25 NOTE — H&P (View-Only) (Signed)
Cardiology Office Note:    Date:  11/25/2022   ID:  Jaime Massey, DOB 06/23/1976, MRN [id number]  PCP:  Patel, Harshal, DO  Cardiologist:  Anyiah Coverdale, MD  Referring MD: Patel, Harshal, DO   CC: new patient consultation for stroke and TEE   History of Present Illness:    Jaime Massey is a 47 y.o. female with a hx of CVA treated with TNK 06/2022 who is seen as a new consult at the request of Patel, Harshal, DO for the evaluation and management of history of CVA, request for TEE.  Notes reviewed. Recently seen by Dr. Sethi on 11/07/22 for CVA history, recommended TEE for further evaluation.  History: -CVA 06/2022, [facility]: facial droop, numbness, tongue deviation. Treated with TNK with resolution of symptoms. Workup including CT angiogram, MRI, echo, A1c, cholesterol, event monitor, and transcranial dopplers was unremarkable.  -persistent left hand/left foot paresthesias  Cardiovascular risk factors: Prior clinical ASCVD: CVA as above Comorbid conditions: Denies hypertension, hyperlipidemia, diabetes, chronic kidney disease  Metabolic syndrome/Obesity: highest adult weight is current weight. Chronic inflammatory conditions: none that she knows of Tobacco use history: former, quit 2019 Family history: grandfather had first MI age 32, had several more, passed away late 50s/early 60s. Father has an aortic aneurysm, has been monitored for the last 14 years without growing. No other known heart disease. Great aunt had a stroke. Prior pertinent testing and/or incidental findings: see summary above Exercise level:gets shortness of breath with significant activity, but not limited. Current diet: breakfast and lunch can be variable, eats biscuit/sandwich. Past six weeks has been especially difficult as her father has been in/out of hospital and SNF.  Has significant stress in her life currently.  Denies shortness of breath at rest. No PND, orthopnea, LE edema or  unexpected weight gain. No syncope or palpitations.   Past Medical History:  Diagnosis Date   Anxiety    Arthritis    Asthma due to environmental allergies    pt states she has never had asthma problems    Depression    DVT (deep venous thrombosis) (HCC)    post c-section   Migraine    PE (pulmonary embolism)    post c-section   Seizures (HCC)    as child from severe ear infections- no meds now and no seizures since age 3   Warfarin anticoagulation    took from 03/2010-12/2010.     Past Surgical History:  Procedure Laterality Date   CESAREAN SECTION     CHOLECYSTECTOMY N/A 12/21/2012   Procedure: LAPAROSCOPIC CHOLECYSTECTOMY;  Surgeon: Brent C Ziegler, MD;  Location: [facility];  Service: General;  Laterality: N/A;   KNEE ARTHROSCOPY WITH MEDIAL MENISECTOMY Right 03/05/2017   Procedure: chondroplasty patella, medial femoral condyle;  Surgeon: Harrison, Stanley E, MD;  Location: [facility];  Service: Orthopedics;  Laterality: Right;    Current Medications: Current Outpatient Medications on File Prior to Visit  Medication Sig   aspirin EC 81 MG tablet Take 1 tablet (81 mg total) by mouth daily. Swallow whole.   buPROPion (WELLBUTRIN XL) 150 MG 24 hr tablet Take 150 mg by mouth daily.   busPIRone (BUSPAR) 5 MG tablet Take 5 mg by mouth 3 (three) times daily.   gabapentin (NEURONTIN) 100 MG capsule Take 300 mg by mouth at bedtime.   levonorgestrel (MIRENA, 52 MG,) 20 MCG/24HR IUD 1 each by Intrauterine route once.   meloxicam (MOBIC) 7.5 MG tablet TAKE 1 TABLET BY MOUTH IN THE MORNING AND   AT BEDTIME   omeprazole (PRILOSEC) 20 MG capsule Take 20 mg by mouth as needed.   predniSONE (DELTASONE) 10 MG tablet TAKE 1 TABLET BY MOUTH ONCE DAILY AS NEEDED FOR SWELLING IN RIGHT KNEE IF NOT IMPROVED WITH IBUPROFEN   RESTASIS 0.05 % ophthalmic emulsion Place 1 drop into both eyes as needed.   traZODone (DESYREL) 50 MG tablet Take 50 mg by mouth at bedtime.   Current Facility-Administered Medications  on File Prior to Visit  Medication   sodium chloride flush (NS) 0.9 % injection 16 mL     Allergies:   Amoxicillin, Topiramate, and Penicillins   Social History   Tobacco Use   Smoking status: Former    Packs/day: 0.50    Years: 26.00    Additional pack years: 0.00    Total pack years: 13.00    Types: Cigarettes    Quit date: 10/2017    Years since quitting: 5.1   Smokeless tobacco: Never  Vaping Use   Vaping Use: Never used  Substance Use Topics   Alcohol use: Yes    Comment: reports occasional beer but very seldom   Drug use: No    Family History: family history includes Aortic aneurysm in her father; Breast cancer in her maternal grandmother and mother; Cancer in her maternal grandfather; Diabetes in her paternal grandmother; Heart disease in her paternal grandfather; Skin cancer in her maternal grandmother. There is no history of Stroke or Neuropathy.  ROS:   Please see the history of present illness.  Additional pertinent ROS: Constitutional: Negative for chills, fever, night sweats, unintentional weight loss  HENT: Negative for ear pain and hearing loss.   Eyes: Negative for loss of vision and eye pain.  Respiratory: Negative for cough, sputum, wheezing.   Cardiovascular: See HPI. Gastrointestinal: Negative for abdominal pain, melena, and hematochezia.  Genitourinary: Negative for dysuria and hematuria.  Musculoskeletal: Negative for falls and myalgias.  Skin: Negative for itching and rash.  Neurological: Negative for focal weakness, focal sensory changes and loss of consciousness.  Endo/Heme/Allergies: Does not bruise/bleed easily.     EKGs/Labs/Other Studies Reviewed:    The following studies were reviewed today: Cardiac Studies & Procedures       ECHOCARDIOGRAM  ECHOCARDIOGRAM COMPLETE BUBBLE STUDY 06/12/2020  Narrative ECHOCARDIOGRAM REPORT    Patient Name:   Jaime Massey Date of Exam: 06/12/2020 Medical Rec #:  [id number]       Height:        69.0 in Accession #:    [id number]      Weight:       232.0 lb Date of Birth:  02/10/1976       BSA:          2.201 m Patient Age:    44 years        BP:           132/79 mmHg Patient Gender: F               HR:           73 bpm. Exam Location:  [address]  Procedure: 2D Echo, Cardiac Doppler, Color Doppler and Saline Contrast Bubble Study  Indications:     I63.9 Stroke  History:         Patient has no prior history of Echocardiogram examinations. Pulmonary embolism; Signs/Symptoms:Numbness.  Sonographer:     Tammie Crouch RCS Referring Phys:  [id number] ANTONIA B AHERN Diagnosing Phys: Dalton Mclean MD  IMPRESSIONS   1.   Left ventricular ejection fraction, by estimation, is 55%. The left ventricle has normal function. The left ventricle has no regional wall motion abnormalities. Left ventricular diastolic parameters were normal. 2. Right ventricular systolic function is normal. The right ventricular size is normal. Tricuspid regurgitation signal is inadequate for assessing PA pressure. 3. The mitral valve is normal in structure. Trivial mitral valve regurgitation. 4. The aortic valve is tricuspid. Aortic valve regurgitation is not visualized. 5. The inferior vena cava is normal in size with greater than 50% respiratory variability, suggesting right atrial pressure of 3 mmHg. 6. Negative bubble study, no PFO or ASD.  FINDINGS Left Ventricle: Left ventricular ejection fraction, by estimation, is 55%. The left ventricle has normal function. The left ventricle has no regional wall motion abnormalities. The left ventricular internal cavity size was normal in size. There is no left ventricular hypertrophy. Left ventricular diastolic parameters were normal.  Right Ventricle: The right ventricular size is normal. No increase in right ventricular wall thickness. Right ventricular systolic function is normal. Tricuspid regurgitation signal is inadequate for assessing PA pressure.  Left  Atrium: Left atrial size was normal in size.  Right Atrium: Right atrial size was normal in size.  Pericardium: There is no evidence of pericardial effusion.  Mitral Valve: The mitral valve is normal in structure. Trivial mitral valve regurgitation.  Tricuspid Valve: The tricuspid valve is normal in structure. Tricuspid valve regurgitation is not demonstrated.  Aortic Valve: The aortic valve is tricuspid. Aortic valve regurgitation is not visualized.  Pulmonic Valve: The pulmonic valve was normal in structure. Pulmonic valve regurgitation is not visualized.  Aorta: The aortic root is normal in size and structure.  Venous: The inferior vena cava is normal in size with greater than 50% respiratory variability, suggesting right atrial pressure of 3 mmHg.  IAS/Shunts: Negative bubble study, no PFO or ASD. Agitated saline contrast was given intravenously to evaluate for intracardiac shunting.   LEFT VENTRICLE PLAX 2D LVIDd:         5.00 cm  Diastology LVIDs:         3.70 cm  LV e' medial:    8.38 cm/s LV PW:         1.00 cm  LV E/e' medial:  10.3 LV IVS:        0.80 cm  LV e' lateral:   11.90 cm/s LVOT diam:     2.10 cm  LV E/e' lateral: 7.2 LV SV:         57 LV SV Index:   26 LVOT Area:     3.46 cm   RIGHT VENTRICLE RV Basal diam:  3.10 cm RV S prime:     14.70 cm/s  LEFT ATRIUM             Index       RIGHT ATRIUM           Index LA diam:        3.90 cm 1.77 cm/m  RA Area:     10.70 cm LA Vol (A2C):   40.5 ml 18.40 ml/m RA Volume:   21.50 ml  9.77 ml/m LA Vol (A4C):   29.9 ml 13.59 ml/m LA Biplane Vol: 36.3 ml 16.50 ml/m AORTIC VALVE LVOT Vmax:   72.50 cm/s LVOT Vmean:  48.800 cm/s LVOT VTI:    0.164 m  AORTA Ao Root diam: 3.00 cm Ao Asc diam:  2.60 cm  MITRAL VALVE MV Area (PHT):               SHUNTS MV Decel Time:             Systemic VTI:  0.16 m MV E velocity: 86.10 cm/s  Systemic Diam: 2.10 cm MV A velocity: 72.00 cm/s MV E/A ratio:  1.20  Dalton  Mclean MD Electronically signed by Dalton Mclean MD Signature Date/Time: 06/12/2020/3:43:08 PM    Final (Updated)    MONITORS  CARDIAC EVENT MONITOR 09/14/2020  Narrative Sinus rhythm No afib No sustained arrhythmias Baseline artifact limits interpretation at times            EKG:  EKG is personally reviewed.   11/25/2022: NSR at 70 bpm  Recent Labs: No results found for requested labs within last 365 days.  Recent Lipid Panel    Component Value Date/Time   CHOL 153 05/16/2020 1001   TRIG 112 05/16/2020 1001   HDL 38 (L) 05/16/2020 1001   CHOLHDL 4.0 05/16/2020 1001   LDLCALC 94 05/16/2020 1001    Physical Exam:    VS:  BP 110/70 (BP Location: Left Arm, Patient Position: Sitting, Cuff Size: Large)   Pulse 70   Ht 5' 9" (1.753 m)   Wt 236 lb 3.2 oz (107.1 kg)   BMI 34.88 kg/m     Wt Readings from Last 3 Encounters:  11/25/22 236 lb 3.2 oz (107.1 kg)  11/07/22 234 lb (106.1 kg)  10/29/22 235 lb 12.8 oz (107 kg)    GEN: Well nourished, well developed in no acute distress HEENT: Normal, moist mucous membranes NECK: No JVD CARDIAC: regular rhythm, normal S1 and S2, no rubs or gallops. No murmur. VASCULAR: Radial and DP pulses 2+ bilaterally. No carotid bruits RESPIRATORY:  Clear to auscultation without rales, wheezing or rhonchi  ABDOMEN: Soft, non-tender, non-distended MUSCULOSKELETAL:  Ambulates independently SKIN: Warm and dry, no edema NEUROLOGIC:  Alert and oriented x 3. No focal neuro deficits noted. PSYCHIATRIC:  Normal affect    ASSESSMENT:    1. Cerebrovascular accident (CVA), unspecified mechanism (HCC)   2. Family history of heart disease   3. Cardiac risk counseling   4. Counseling on health promotion and disease prevention   5. Encounter to discuss procedure    PLAN:    CVA -06/2022, treated with TNK -negative workup to date -recommended for TEE. Discussed procedure at length today  Shared Decision Making/Informed Consent The risks  [esophageal damage, perforation (1:10,000 risk), bleeding, pharyngeal hematoma as well as other potential complications associated with conscious sedation including aspiration, arrhythmia, respiratory failure and death], benefits (treatment guidance and diagnostic support) and alternatives of a transesophageal echocardiogram were discussed in detail with Jaime Massey and she is willing to proceed.    ASCVD history CVA -recommend Wegovy to decrease future CVD risk, discuss at follow up  Cardiac risk counseling and prevention recommendations: -recommend heart healthy/Mediterranean diet, with whole grains, fruits, vegetable, fish, lean meats, nuts, and olive oil. Limit salt. -recommend moderate walking, 3-5 times/week for 30-50 minutes each session. Aim for at least 150 minutes.week. Goal should be pace of 3 miles/hours, or walking 1.5 miles in 30 minutes -recommend avoidance of tobacco products. Avoid excess alcohol. -ASCVD risk score: The ASCVD Risk score (Arnett DK, et al., 2019) failed to calculate for the following reasons:   The patient has a prior MI or stroke diagnosis    Plan for follow up: 3 mos  Trine Fread, MD, PhD, FACC [place]  [facility]  [place]  [facility] at [address], [address] [place], [place] [address] [phone]   Medication Adjustments/Labs and Tests Ordered: Current medicines are reviewed at length with the patient today.  Concerns regarding medicines are outlined above.  Orders Placed This Encounter  Procedures   Basic metabolic panel   CBC   EKG 12-Lead   No orders of the defined types were placed in this encounter.   Patient Instructions  Medication Instructions:  Your physician recommends that you continue on your current medications as directed. Please refer to the Current Medication list given to you today.  *If you need a refill on your cardiac medications before your  next appointment, please call your pharmacy*   Lab Work: Your physician recommends that you return for lab work today- BMP and CBC   If you have labs (blood work) drawn today and your tests are completely normal, you will receive your results only by: MyChart Message (if you have MyChart) OR A paper copy in the mail If you have any lab test that is abnormal or we need to change your treatment, we will call you to review the results.   Testing/Procedures: You are scheduled for a TEE (Transesophageal Echocardiogram) on Thursday, May 23 with Dr. Vontrell Pullman.  Please arrive at the [facility] (Main Entrance A) at [facility]: [address] , [place] [address] at 12:00 PM (This time is 1 hour(s) before your procedure to ensure your preparation). Free valet parking service is available. You will check in at ADMITTING. The support person will be asked to wait in the waiting room.  It is OK to have someone drop you off and come back when you are ready to be discharged.     DIET:  Nothing to eat or drink after midnight except a sip of water with medications (see medication instructions below)  LABS: Labs done at OV 5/20  FYI:  For your safety, and to allow us to monitor your vital signs accurately during the surgery/procedure we request: If you have artificial nails, gel coating, SNS etc, please have those removed prior to your surgery/procedure. Not having the nail coverings /polish removed may result in cancellation or delay of your surgery/procedure.  You must have a responsible person to drive you home and stay in the waiting area during your procedure. Failure to do so could result in cancellation.  Bring your insurance cards.  *Special Note: Every effort is made to have your procedure done on time. Occasionally there are emergencies that occur at the hospital that may cause delays. Please be patient if a delay does occur.   Follow-Up: At [facility], you and  your health needs are our priority.  As part of our continuing mission to provide you with exceptional heart care, we have created designated Provider Care Teams.  These Care Teams include your primary Cardiologist (physician) and Advanced Practice Providers (APPs -  Physician Assistants and Nurse Practitioners) who all work together to provide you with the care you need, when you need it.  We recommend signing up for the patient portal called "MyChart".  Sign up information is provided on this After Visit Summary.  MyChart is used to connect with patients for Virtual Visits (Telemedicine).  Patients are able to view lab/test results, encounter notes, upcoming appointments, etc.  Non-urgent messages can be sent to your provider as well.   To learn more about what you can do with MyChart, go to [email or web address].    Your next appointment:   3 month(s)  Provider:     Leul Narramore, MD or Caitlin Walker, NP     Signed, Horald Birky, MD PhD 11/25/2022 10:08 AM    [facility]  

## 2022-11-26 LAB — CBC
Hematocrit: 39.7 % (ref 34.0–46.6)
Hemoglobin: 13.4 g/dL (ref 11.1–15.9)
MCH: 32.2 pg (ref 26.6–33.0)
MCHC: 33.8 g/dL (ref 31.5–35.7)
MCV: 95 fL (ref 79–97)
Platelets: 183 10*3/uL (ref 150–450)
RBC: 4.16 x10E6/uL (ref 3.77–5.28)
RDW: 11.8 % (ref 11.7–15.4)
WBC: 6.4 10*3/uL (ref 3.4–10.8)

## 2022-11-26 LAB — BASIC METABOLIC PANEL
BUN/Creatinine Ratio: 19 (ref 9–23)
BUN: 14 mg/dL (ref 6–24)
CO2: 23 mmol/L (ref 20–29)
Calcium: 9.1 mg/dL (ref 8.7–10.2)
Chloride: 106 mmol/L (ref 96–106)
Creatinine, Ser: 0.75 mg/dL (ref 0.57–1.00)
Glucose: 100 mg/dL — ABNORMAL HIGH (ref 70–99)
Potassium: 4.8 mmol/L (ref 3.5–5.2)
Sodium: 139 mmol/L (ref 134–144)
eGFR: 99 mL/min/{1.73_m2} (ref >59)

## 2022-11-27 ENCOUNTER — Ambulatory Visit (HOSPITAL_COMMUNITY): Payer: Medicaid Other | Admitting: Physical Therapy

## 2022-11-27 ENCOUNTER — Telehealth (HOSPITAL_BASED_OUTPATIENT_CLINIC_OR_DEPARTMENT_OTHER): Payer: Self-pay | Admitting: Cardiology

## 2022-11-27 NOTE — Telephone Encounter (Signed)
Spoke with patient who stated she spoke with her insurance company and nothing pending Called hospital back and was told pending  If no PA obtained patient will need to reschedule   Advised would forward to PA department and either they would call her back today or I will to rescheduled

## 2022-11-27 NOTE — Telephone Encounter (Signed)
Rescheduled patient for 1:00 pm 5/24 with Dr Jacques Navy, aware of date and time

## 2022-11-27 NOTE — Telephone Encounter (Signed)
Patient called stating the hospital called her about her procedure tomorrow stating the Berkley Harvey is still pending, to give [place] a call to see if we wanted to reschedule it.   They told her the auth still might come through later on today from the insurance or it might not and then she would be stuck with a bill.

## 2022-11-27 NOTE — Progress Notes (Signed)
Spoke with patient, Procedure scheduled for  1300, Please arrive at the hospital at 1200, NPO after midnight on Wednesday, May take meds with sips of water in the AM, please have transportation for home post procedure, and someone to stay with pt for approximately 24 hours after  Pt stated that Insurance is still pending, per noted in chart, insurance is still pending, Trish notified, pt given info concerning procedure in case insurance is approved and she is notified

## 2022-11-29 NOTE — Pre-Procedure Instructions (Signed)
Spoke to patient on phone regarding TEE on Tuesday - arrive at 1200, NPO after midnight on Monday, confirmed patient has ride home and responsible person to stay with patient for 24 hours after procedure

## 2022-12-03 ENCOUNTER — Ambulatory Visit (HOSPITAL_COMMUNITY): Payer: Medicaid Other

## 2022-12-03 ENCOUNTER — Encounter (HOSPITAL_COMMUNITY)
Admission: RE | Disposition: A | Discharge status: Home / Self Care | Payer: Self-pay | Source: Home / Self Care | Attending: Internal Medicine

## 2022-12-03 ENCOUNTER — Inpatient Hospital Stay (HOSPITAL_COMMUNITY): Payer: Medicaid Other | Admitting: Certified Registered Nurse Anesthetist

## 2022-12-03 ENCOUNTER — Ambulatory Visit (HOSPITAL_COMMUNITY)
Admission: RE | Admit: 2022-12-03 | Discharge: 2022-12-03 | Disposition: A | Discharge status: Home / Self Care | Payer: Medicaid Other | Attending: Internal Medicine | Admitting: Internal Medicine

## 2022-12-03 ENCOUNTER — Other Ambulatory Visit: Payer: Self-pay

## 2022-12-03 ENCOUNTER — Ambulatory Visit (HOSPITAL_BASED_OUTPATIENT_CLINIC_OR_DEPARTMENT_OTHER)
Admission: RE | Admit: 2022-12-03 | Discharge: 2022-12-03 | Disposition: A | Discharge status: Home / Self Care | Payer: Medicaid Other | Source: Ambulatory Visit | Attending: Cardiology | Admitting: Cardiology

## 2022-12-03 ENCOUNTER — Encounter (HOSPITAL_COMMUNITY): Payer: Self-pay | Admitting: Internal Medicine

## 2022-12-03 DIAGNOSIS — I340 Nonrheumatic mitral (valve) insufficiency: Secondary | ICD-10-CM

## 2022-12-03 DIAGNOSIS — Z823 Family history of stroke: Secondary | ICD-10-CM | POA: Insufficient documentation

## 2022-12-03 DIAGNOSIS — J45909 Unspecified asthma, uncomplicated: Secondary | ICD-10-CM | POA: Diagnosis not present

## 2022-12-03 DIAGNOSIS — I639 Cerebral infarction, unspecified: Secondary | ICD-10-CM | POA: Diagnosis present

## 2022-12-03 DIAGNOSIS — Z8249 Family history of ischemic heart disease and other diseases of the circulatory system: Secondary | ICD-10-CM | POA: Diagnosis not present

## 2022-12-03 DIAGNOSIS — Z8673 Personal history of transient ischemic attack (TIA), and cerebral infarction without residual deficits: Secondary | ICD-10-CM | POA: Insufficient documentation

## 2022-12-03 DIAGNOSIS — I2510 Atherosclerotic heart disease of native coronary artery without angina pectoris: Secondary | ICD-10-CM | POA: Diagnosis not present

## 2022-12-03 DIAGNOSIS — Z87891 Personal history of nicotine dependence: Secondary | ICD-10-CM | POA: Insufficient documentation

## 2022-12-03 DIAGNOSIS — F418 Other specified anxiety disorders: Secondary | ICD-10-CM

## 2022-12-03 DIAGNOSIS — Z86711 Personal history of pulmonary embolism: Secondary | ICD-10-CM | POA: Insufficient documentation

## 2022-12-03 DIAGNOSIS — Z86718 Personal history of other venous thrombosis and embolism: Secondary | ICD-10-CM | POA: Insufficient documentation

## 2022-12-03 HISTORY — PX: TEE WITHOUT CARDIOVERSION: SHX5443

## 2022-12-03 LAB — ECHO TEE: Est EF: 50

## 2022-12-03 LAB — PREGNANCY, URINE: Preg Test, Ur: NEGATIVE

## 2022-12-03 SURGERY — ECHOCARDIOGRAM, TRANSESOPHAGEAL
Anesthesia: Monitor Anesthesia Care

## 2022-12-03 MED ORDER — PROPOFOL 500 MG/50ML IV EMUL
INTRAVENOUS | Status: DC | PRN
  Administered 2022-12-03: 150 ug/kg/min via INTRAVENOUS

## 2022-12-03 MED ORDER — SODIUM CHLORIDE 0.9 % IV SOLN: INTRAVENOUS | Status: DC

## 2022-12-03 MED ORDER — PROPOFOL 10 MG/ML IV BOLUS
INTRAVENOUS | Status: DC | PRN
  Administered 2022-12-03: 20 mg via INTRAVENOUS
  Administered 2022-12-03: 100 mg via INTRAVENOUS

## 2022-12-03 MED ORDER — LIDOCAINE 2% (20 MG/ML) 5 ML SYRINGE
INTRAMUSCULAR | Status: DC | PRN
  Administered 2022-12-03: 100 mg via INTRAVENOUS

## 2022-12-03 NOTE — Transfer of Care (Signed)
Immediate Anesthesia Transfer of Care Note  Patient: Jaime Massey  Procedure(s) Performed: TRANSESOPHAGEAL ECHOCARDIOGRAM  Patient Location: PACU and Cath Lab  Anesthesia Type:MAC  Level of Consciousness: awake and alert   Airway & Oxygen Therapy: Patient Spontanous Breathing and Patient connected to nasal cannula oxygen  Post-op Assessment: Report given to RN and Post -op Vital signs reviewed and stable  Post vital signs: Reviewed and stable  Last Vitals:  Vitals Value Taken Time  BP    Temp    Pulse    Resp    SpO2      Last Pain:  Vitals:   12/03/22 1207  TempSrc:   PainSc: 0-No pain         Complications: No notable events documented.

## 2022-12-03 NOTE — Anesthesia Postprocedure Evaluation (Signed)
Anesthesia Post Note  Patient: AANAYA JANIGA  Procedure(s) Performed: TRANSESOPHAGEAL ECHOCARDIOGRAM     Patient location during evaluation: Cath Lab Anesthesia Type: MAC Level of consciousness: awake and alert Pain management: pain level controlled Vital Signs Assessment: post-procedure vital signs reviewed and stable Respiratory status: spontaneous breathing, nonlabored ventilation, respiratory function stable and patient connected to nasal cannula oxygen Cardiovascular status: stable and blood pressure returned to baseline Postop Assessment: no apparent nausea or vomiting Anesthetic complications: no   No notable events documented.  Last Vitals:  Vitals:   12/03/22 1310 12/03/22 1322  BP: 127/87 131/84  Pulse: 70 (!) 55  Resp: 15 14  Temp: 36.7 C   SpO2: 97% 97%    Last Pain:  Vitals:   12/03/22 1322  TempSrc:   PainSc: 0-No pain                 Collene Schlichter

## 2022-12-03 NOTE — Progress Notes (Signed)
  Echocardiogram Echocardiogram Transesophageal has been performed.  Jaime Massey 12/03/2022, 1:08 PM

## 2022-12-03 NOTE — CV Procedure (Signed)
INDICATIONS: stroke  PROCEDURE:   Informed consent was obtained prior to the procedure. The risks, benefits and alternatives for the procedure were discussed and the patient comprehended these risks.  Risks include, but are not limited to, cough, sore throat, vomiting, nausea, somnolence, esophageal and stomach trauma or perforation, bleeding, low blood pressure, aspiration, pneumonia, infection, trauma to the teeth and death.    After a procedural time-out, the oropharynx was anesthetized with 20% benzocaine spray.   During this procedure the patient was administered propofol per anesthesia.  The patient's heart rate, blood pressure, and oxygen saturation were monitored continuously during the procedure. The period of conscious sedation was 30 minutes, of which I was present face-to-face 100% of this time.  The transesophageal probe was inserted in the esophagus and stomach without difficulty and multiple views were obtained.  The patient was kept under observation until the patient left the procedure room.  The patient left the procedure room in stable condition.   Agitated microbubble saline contrast was administered.  COMPLICATIONS:    There were no immediate complications.  FINDINGS:   FORMAL ECHOCARDIOGRAM REPORT PENDING Normal biventricular size and function. Mild mitral valve regurgitation. Small degenerative strands on the atrial aspect of the mitral valve, cannot exclude small lateral PFE in setting of prior stroke.   RECOMMENDATIONS:     Discharge when alert. Consider antiplatelet therapy post stroke given question of PFE.   Time Spent Directly with the Patient:  60 minutes   Parke Poisson 12/03/2022, 12:56 PM

## 2022-12-03 NOTE — Anesthesia Preprocedure Evaluation (Addendum)
Anesthesia Evaluation  Patient identified by MRN, date of birth, ID band Patient awake    Reviewed: Allergy & Precautions, NPO status , Patient's Chart, lab work & pertinent test results  Airway Mallampati: II  TM Distance: >3 FB Neck ROM: Full    Dental  (+) Teeth Intact, Dental Advisory Given   Pulmonary asthma , Patient abstained from smoking., former smoker   Pulmonary exam normal breath sounds clear to auscultation       Cardiovascular + DVT  Normal cardiovascular exam Rhythm:Regular Rate:Normal     Neuro/Psych  Headaches, Seizures -, Well Controlled,  PSYCHIATRIC DISORDERS Anxiety Depression    CVA    GI/Hepatic Neg liver ROS,GERD  Medicated,,  Endo/Other  Obesity   Renal/GU negative Renal ROS     Musculoskeletal  (+) Arthritis ,    Abdominal   Peds  Hematology  (+) Blood dyscrasia (Warfarin)   Anesthesia Other Findings Day of surgery medications reviewed with the patient.  Reproductive/Obstetrics negative OB ROS                             Anesthesia Physical Anesthesia Plan  ASA: 3  Anesthesia Plan: General   Post-op Pain Management: Minimal or no pain anticipated   Induction: Intravenous  PONV Risk Score and Plan: 3 and TIVA and Treatment may vary due to age or medical condition  Airway Management Planned: Natural Airway and Simple Face Mask  Additional Equipment:   Intra-op Plan:   Post-operative Plan:   Informed Consent: I have reviewed the patients History and Physical, chart, labs and discussed the procedure including the risks, benefits and alternatives for the proposed anesthesia with the patient or authorized representative who has indicated his/her understanding and acceptance.     Dental advisory given  Plan Discussed with: CRNA  Anesthesia Plan Comments:        Anesthesia Quick Evaluation

## 2022-12-03 NOTE — Interval H&P Note (Signed)
History and Physical Interval Note:  12/03/2022 11:48 AM  Jaime Massey  has presented today for surgery, with the diagnosis of STROKE.  The various methods of treatment have been discussed with the patient and family. After consideration of risks, benefits and other options for treatment, the patient has consented to  Procedure(s): TRANSESOPHAGEAL ECHOCARDIOGRAM (N/A) as a surgical intervention.  The patient's history has been reviewed, patient examined, no change in status, stable for surgery.  I have reviewed the patient's chart and labs.  Questions were answered to the patient's satisfaction.     Parke Poisson

## 2022-12-03 NOTE — Discharge Instructions (Signed)

## 2022-12-03 NOTE — Telephone Encounter (Signed)
Patient had TEE today

## 2022-12-04 ENCOUNTER — Encounter (HOSPITAL_COMMUNITY): Payer: Self-pay | Admitting: Internal Medicine

## 2022-12-04 ENCOUNTER — Ambulatory Visit (HOSPITAL_COMMUNITY): Payer: Medicaid Other | Admitting: Physical Therapy

## 2022-12-10 ENCOUNTER — Ambulatory Visit (HOSPITAL_COMMUNITY): Payer: Medicaid Other | Attending: Neurology | Admitting: Physical Therapy

## 2022-12-10 DIAGNOSIS — R2689 Other abnormalities of gait and mobility: Secondary | ICD-10-CM | POA: Insufficient documentation

## 2022-12-10 DIAGNOSIS — R262 Difficulty in walking, not elsewhere classified: Secondary | ICD-10-CM | POA: Insufficient documentation

## 2022-12-10 NOTE — Therapy (Signed)
OUTPATIENT PHYSICAL THERAPY TREATMENT   Patient Name: Jaime Massey MRN: [id number] DOB:1976-05-14, 47 y.o., female Today's Date: 12/10/2022   PCP: [name], DO REFERRING PROVIDER:  Micki Riley, MD [facility] none    END OF SESSION:  PT End of Session - 12/10/22 0752     Visit Number 2    Number of Visits 6    Date for PT Re-Evaluation 01/03/23    Authorization Type Medicaid; 12 visits approved 5/17-7/16    Authorization - Visit Number 1    Authorization - Number of Visits 12    Progress Note Due on Visit 6    PT Start Time 0740    PT Stop Time 0815    PT Time Calculation (min) 35 min    Activity Tolerance Patient tolerated treatment well    Behavior During Therapy [facility] for tasks assessed/performed             Past Medical History:  Diagnosis Date   Anxiety    Arthritis    Asthma due to environmental allergies    pt states she has never had asthma problems    Depression    DVT (deep venous thrombosis) (HCC)    post c-section   Migraine    PE (pulmonary embolism)    post c-section   Seizures (HCC)    as child from severe ear infections- no meds now and no seizures since age 35   Warfarin anticoagulation    took from 03/2010-12/2010.    Past Surgical History:  Procedure Laterality Date   CESAREAN SECTION     CHOLECYSTECTOMY N/A 12/21/2012   Procedure: LAPAROSCOPIC CHOLECYSTECTOMY;  Surgeon: Fabio Bering, MD;  Location: [facility];  Service: General;  Laterality: N/A;   KNEE ARTHROSCOPY WITH MEDIAL MENISECTOMY Right 03/05/2017   Procedure: chondroplasty patella, medial femoral condyle;  Surgeon: Vickki Hearing, MD;  Location: [facility];  Service: Orthopedics;  Laterality: Right;   TEE WITHOUT CARDIOVERSION N/A 12/03/2022   Procedure: TRANSESOPHAGEAL ECHOCARDIOGRAM;  Surgeon: Parke Poisson, MD;  Location: [facility] INVASIVE CV LAB;  Service: Cardiovascular;  Laterality: N/A;   Patient Active Problem List   Diagnosis Date Noted   Acute  CVA (cerebrovascular accident) (HCC) 06/19/2022   History of pulmonary embolus (PE) 06/19/2022   Spinal stenosis of cervical region 08/30/2020   Numbness and tingling of right arm and leg 07/31/2020   S/P knee surgery patella chondroplasty 03/05/17 02/12/2018   Obesity 12/19/2017   Chondromalacia of lateral femoral condyle, right    Chondromalacia of medial condyle of right femur    Chondromalacia patellae of right knee    Rupture of anterior cruciate ligament of right knee    Cyst of anterior horn of lateral meniscus of right knee    Adjustment disorder with anxious mood 02/15/2017   Bandemia without diagnosis of specific infection 02/15/2017   Pulmonary embolus (HCC) 02/15/2017   FUO (fever of unknown origin) 02/15/2017   New daily persistent headache 02/15/2017   Arthritis of knee, right 12/26/2016   Bone spur 12/03/2016   Fever of unknown origin 10/01/2016    ONSET DATE: 06/19/2022  REFERRING DIAG: R29.90 (ICD-10-CM) - Stroke-like episode R20.2 (ICD-10-CM) - Paresthesia  THERAPY DIAG:  Difficulty in walking, not elsewhere classified  Other abnormalities of gait and mobility  Rationale for Evaluation and Treatment: Rehabilitation  SUBJECTIVE:  SUBJECTIVE STATEMENT: Pt states she is doing well today, no pain or issues.   Evaluation: Patient with facial droop right side went to [facility]; TPA resolved symptoms the next day and she was discharged the next day; her neurologist Aheron referred to [facility]; wants to do MRI of the spine but needs a course of therapy first; sees cardiologist Monday;   residual paraesthesia left hand and foot; foot goes numb every day;  Pt accompanied by: self  PERTINENT HISTORY:  2021 migraine with aura that may have been stroke? But was right side symptoms at  that time  valve leaking heart Bilateral knee OA  PAIN:  Are you having pain? No  PRECAUTIONS: None  WEIGHT BEARING RESTRICTIONS: No  FALLS: Has patient fallen in last 6 months? No  PLOF: Independent  PATIENT GOALS: return to normal  OBJECTIVE:    COGNITION:Within functional limits for tasks assessed   DIAGNOSTIC FINDINGS:   CLINICAL DATA:  47 year old female status post code stroke presentation last month. TIA, morning headaches, tongue deviation, left side numbness, facial droop.   EXAM: MRI HEAD WITHOUT AND WITH CONTRAST   TECHNIQUE: Multiplanar, multiecho pulse sequences of the brain and surrounding structures were obtained without and with intravenous contrast.   CONTRAST:  10mL GADAVIST GADOBUTROL 1 MMOL/ML IV SOLN   COMPARISON:  Brain MRI, CTA and CT head and neck 06/19/2022.   FINDINGS: Brain: Cerebral volume is stable and within normal limits. Mild asymmetry of the lateral ventricles is most likely normal anatomic variation.   No restricted diffusion to suggest acute infarction. No midline shift, mass effect, evidence of mass lesion, ventriculomegaly, extra-axial collection or acute intracranial hemorrhage. Cervicomedullary junction and pituitary are within normal limits.   [name] Minimal (series 15, image 26), nonspecific white matter T2 and FLAIR hyperintensity extent is normal for this age group. No cortical encephalomalacia or chronic cerebral blood products identified.   No abnormal enhancement identified. No dural thickening or enhancement.   Vascular: Major intracranial vascular flow voids are stable. Major dural venous sinuses are enhancing and appear to be patent.   Skull and upper cervical spine: Stable visible cervical spine with mild C3-C4 disc degeneration, spondylolisthesis. Visualized bone marrow signal is within normal  limits.   Sinuses/Orbits: Stable, negative.   Other: Mastoids remain clear. Visible internal auditory structures appear normal. Negative visible scalp and face.   IMPRESSION: Stable and normal for age MRI appearance of the Brain.      SENSATION: Reports of numbness left hand and foot  COORDINATION: Nose to finger   EDEMA:  Both feet swell   MUSCLE TONE: wfl    DTRs:  Not tested at eval  POSTURE: rounded shoulders and forward head  LOWER EXTREMITY ROM:     Active  Right Eval Left Eval  Hip flexion    Hip extension    Hip abduction    Hip adduction    Hip internal rotation    Hip external rotation    Knee flexion    Knee extension    Ankle dorsiflexion    Ankle plantarflexion    Ankle inversion    Ankle eversion     (Blank rows = not tested) UPPER AND LOWER EXTREMITY MMT:        MMT Right Eval Left Eval  Shoulder flexion  4+ 4+  Bicep (elbow flexion) 5 4  Tricep (elbow extension) 5 4  Hip flexion 4+ 4+  Hip extension    Hip abduction    Hip adduction    Hip internal rotation    Hip external rotation    Knee flexion    Knee extension 4- (OA knee) 5  Ankle dorsiflexion 4+ 4+  Ankle plantarflexion    Ankle inversion    Ankle eversion    (Blank rows = not tested)  TRANSFERS: Assistive device utilized: None  Sit to stand: Modified independence Stand to sit: Modified independence Chair to chair: Modified independence Floor:  not tested   STAIRS: Level of Assistance: Modified independence Stair Negotiation Technique: Alternating Pattern  with No Rails Number of Stairs: 4-8  Height of Stairs: 4" to 8"  Comments: more difficulty managing descending 8" steps  GAIT: Gait pattern: WFL Distance walked: 100 ft Assistive device utilized: None Level of assistance: Modified independence Comments: slight decreased gait speed  FUNCTIONAL TESTS:  5 times sit to stand: 17.54 sec Dynamic Gait Index: 17/24 SLS Right 18 sec; left 3 sec  DGI 1.  Gait level surface (3) Normal: Walks 20', no assistive devices, good sped, no evidence for imbalance, normal gait pattern 2. Change in gait speed (2) Mild Impairment: Is able to change speed but demonstrates mild gait deviations, or not gait deviations but unable to achieve a significant change in velocity, or uses an assistive device. 3. Gait with horizontal head turns (2) Mild Impairment: Performs head turns smoothly with slight change in gait velocity, i.e., minor disruption to smooth gait path or uses walking aid. 4. Gait with vertical head turns (2) Mild Impairment: Performs head turns smoothly with slight change in gait velocity, i.e., minor disruption to smooth gait path or uses walking aid. 5. Gait and pivot turn (2) Mild Impairment: Pivot turns safely in > 3 seconds and stops with no loss of balance. 6. Step over obstacle (2) Mild Impairment: Is able to step over box, but must slow down and adjust steps to clear box safely. 7. Step around obstacles (2) Mild Impairment: Is able to step around both cones, but must slow down and adjust steps to clear cones. 8. Stairs (2) Mild Impairment: Alternating feet, must use rail.  TOTAL SCORE: 17 / 24   PATIENT SURVEYS:  Stroke Impact Scale 263  TODAY'S TREATMENT:                                                                                                                              DATE: 12/10/22 Goal review/HEP Standing:  heelraise/toeraise 15X each  Vector stance 10X5" each with 1 HHA  Tandem stance on foam 2X30" each  SLS max 30" Rt  20" Lt  7" Forward step up with opposite LE power up 10X bil  7" lateral step up bil 10X   11/22/22 physical therapy evaluation and HEP    PATIENT EDUCATION: Education details: Patient educated on exam findings, POC, scope of PT, HEP, and what to expect next visit.  Person educated: Patient Education method: Explanation, Demonstration, and Handouts Education comprehension: verbalized  understanding, returned demonstration, verbal cues required, and tactile cues required  HOME EXERCISE PROGRAM: Access Code: [email or web address] URL: [email or web address] Date: 11/22/2022 Prepared by: [facility] - [facility]  Exercises - Single Leg Stance  - 2-3 x daily - 7 x weekly - 1 sets - 10 reps - 30 sec hold  GOALS: Goals reviewed with patient? Yes  SHORT TERM GOALS: Target date: 12/13/2022  patient will be independent with initial HEP   Baseline: Goal status: MET  2.  Patient will self report 30% improvement to improve tolerance for functional activity  Baseline:  Goal status: MET  LONG TERM GOALS: Target date: 01/03/2023  Patient will be independent in self management strategies to improve quality of life and functional outcomes.  Baseline:  Goal status: IN PROGRESS  2.  Patient will self report 50% improvement to improve tolerance for functional activity  Baseline:  Goal status: IN PROGRESS  3.  Patient will be able to stand on each leg 30" SLS to demonstrate improved functional balance Baseline: right 18"; left 3 " Goal status: IN PROGRESS  4.  Patient will improve DGI to 20/24 to demonstrate improved functional gait and balance Baseline: 17/24 Goal status: IN PROGRESS   ASSESSMENT:  CLINICAL IMPRESSION: Reviewed goals and POC moving forward.  Pt has met both short-term goals and progressing towards long-term goals.  Progressed LE strength and stability exercises.  Pt able to complete with verbal and tactile cues. Tandem stance too easy on level ground so added foam to increase challenge.  Improved SLS today with full 30" achieved on Rt, 20" on Lt.  Pt with slight complaint of Rt knee pain with lateral descending steps, unable to complete heel tap first on this side to reduce discomfort.  Patient will continue to benefit from skilled physical therapy services to address deficits and improve level of function with ADLs, functional mobility tasks, and reduce risk for  falls.   OBJECTIVE IMPAIRMENTS: Abnormal gait, decreased activity tolerance, decreased balance, decreased endurance, difficulty walking, decreased strength, impaired perceived functional ability, and pain.   ACTIVITY LIMITATIONS: carrying, lifting, bending, sitting, standing, squatting, sleeping, stairs, locomotion level, and caring for others  PARTICIPATION LIMITATIONS: meal prep, cleaning, laundry, shopping, community activity, occupation, and yard work    [facility] POTENTIAL: Good  CLINICAL DECISION MAKING: Evolving/moderate complexity  EVALUATION COMPLEXITY: Moderate  PLAN:  PT FREQUENCY: 1x/week  PT DURATION: 6 weeks  PLANNED INTERVENTIONS: Therapeutic exercises, Therapeutic activity, Neuromuscular re-education, Balance training, Gait training, Patient/Family education, Joint manipulation, Joint mobilization, Stair training, Orthotic/Fit training, DME instructions, Aquatic Therapy, Dry Needling, Electrical stimulation, Spinal manipulation, Spinal mobilization, Cryotherapy, Moist heat, Compression bandaging, scar mobilization, Splintting, Taping, Traction, Ultrasound, Ionotophoresis 4mg /ml Dexamethasone, and Manual therapy   PLAN FOR NEXT SESSION: continue with higher level balance and gait training; general strengthening   8:02 AM, 12/10/22 Lurena Nida, PTA/CLT [facility] Ph: [phone]

## 2022-12-16 ENCOUNTER — Ambulatory Visit (HOSPITAL_COMMUNITY): Payer: Medicaid Other | Admitting: Physical Therapy

## 2022-12-16 DIAGNOSIS — R2689 Other abnormalities of gait and mobility: Secondary | ICD-10-CM

## 2022-12-16 DIAGNOSIS — R262 Difficulty in walking, not elsewhere classified: Secondary | ICD-10-CM | POA: Diagnosis not present

## 2022-12-16 NOTE — Therapy (Signed)
OUTPATIENT PHYSICAL THERAPY TREATMENT   Patient Name: Jaime Massey MRN: [id number] DOB:08/11/1975, 47 y.o., female Today's Date: 12/16/2022   PCP: [name], DO REFERRING PROVIDER:  Micki Riley, MD [facility] none    END OF SESSION:  PT End of Session - 12/16/22 0749     Visit Number 3    Number of Visits 6    Date for PT Re-Evaluation 01/03/23    Authorization Type Medicaid; 12 visits approved 5/17-7/16    Authorization - Visit Number 3    Authorization - Number of Visits 12    Progress Note Due on Visit 6    PT Start Time 0747    PT Stop Time 0815    PT Time Calculation (min) 28 min    Activity Tolerance Patient tolerated treatment well    Behavior During Therapy [facility] for tasks assessed/performed             Past Medical History:  Diagnosis Date   Anxiety    Arthritis    Asthma due to environmental allergies    pt states she has never had asthma problems    Depression    DVT (deep venous thrombosis) (HCC)    post c-section   Migraine    PE (pulmonary embolism)    post c-section   Seizures (HCC)    as child from severe ear infections- no meds now and no seizures since age 46   Warfarin anticoagulation    took from 03/2010-12/2010.    Past Surgical History:  Procedure Laterality Date   CESAREAN SECTION     CHOLECYSTECTOMY N/A 12/21/2012   Procedure: LAPAROSCOPIC CHOLECYSTECTOMY;  Surgeon: Fabio Bering, MD;  Location: [facility];  Service: General;  Laterality: N/A;   KNEE ARTHROSCOPY WITH MEDIAL MENISECTOMY Right 03/05/2017   Procedure: chondroplasty patella, medial femoral condyle;  Surgeon: Vickki Hearing, MD;  Location: [facility];  Service: Orthopedics;  Laterality: Right;   TEE WITHOUT CARDIOVERSION N/A 12/03/2022   Procedure: TRANSESOPHAGEAL ECHOCARDIOGRAM;  Surgeon: Parke Poisson, MD;  Location: [facility] INVASIVE CV LAB;  Service: Cardiovascular;  Laterality: N/A;   Patient Active Problem List   Diagnosis Date Noted   Acute  CVA (cerebrovascular accident) (HCC) 06/19/2022   History of pulmonary embolus (PE) 06/19/2022   Spinal stenosis of cervical region 08/30/2020   Numbness and tingling of right arm and leg 07/31/2020   S/P knee surgery patella chondroplasty 03/05/17 02/12/2018   Obesity 12/19/2017   Chondromalacia of lateral femoral condyle, right    Chondromalacia of medial condyle of right femur    Chondromalacia patellae of right knee    Rupture of anterior cruciate ligament of right knee    Cyst of anterior horn of lateral meniscus of right knee    Adjustment disorder with anxious mood 02/15/2017   Bandemia without diagnosis of specific infection 02/15/2017   Pulmonary embolus (HCC) 02/15/2017   FUO (fever of unknown origin) 02/15/2017   New daily persistent headache 02/15/2017   Arthritis of knee, right 12/26/2016   Bone spur 12/03/2016   Fever of unknown origin 10/01/2016    ONSET DATE: 06/19/2022  REFERRING DIAG: R29.90 (ICD-10-CM) - Stroke-like episode R20.2 (ICD-10-CM) - Paresthesia  THERAPY DIAG:  Difficulty in walking, not elsewhere classified  Other abnormalities of gait and mobility  Rationale for Evaluation and Treatment: Rehabilitation  SUBJECTIVE:  SUBJECTIVE STATEMENT: Pt reports no complaints or issues.    Evaluation: Patient with facial droop right side went to [facility]; TPA resolved symptoms the next day and she was discharged the next day; her neurologist Aheron referred to [facility]; wants to do MRI of the spine but needs a course of therapy first; sees cardiologist Monday;   residual paraesthesia left hand and foot; foot goes numb every day;  Pt accompanied by: self  PERTINENT HISTORY:  2021 migraine with aura that may have been stroke? But was right side symptoms at that time  valve  leaking heart Bilateral knee OA  PAIN:  Are you having pain? No  PRECAUTIONS: None  WEIGHT BEARING RESTRICTIONS: No  FALLS: Has patient fallen in last 6 months? No  PLOF: Independent  PATIENT GOALS: return to normal  OBJECTIVE:    COGNITION:Within functional limits for tasks assessed   DIAGNOSTIC FINDINGS:   CLINICAL DATA:  47 year old female status post code stroke presentation last month. TIA, morning headaches, tongue deviation, left side numbness, facial droop.   EXAM: MRI HEAD WITHOUT AND WITH CONTRAST   TECHNIQUE: Multiplanar, multiecho pulse sequences of the brain and surrounding structures were obtained without and with intravenous contrast.   CONTRAST:  10mL GADAVIST GADOBUTROL 1 MMOL/ML IV SOLN   COMPARISON:  Brain MRI, CTA and CT head and neck 06/19/2022.   FINDINGS: Brain: Cerebral volume is stable and within normal limits. Mild asymmetry of the lateral ventricles is most likely normal anatomic variation.   No restricted diffusion to suggest acute infarction. No midline shift, mass effect, evidence of mass lesion, ventriculomegaly, extra-axial collection or acute intracranial hemorrhage. Cervicomedullary junction and pituitary are within normal limits.   [name] Minimal (series 15, image 26), nonspecific white matter T2 and FLAIR hyperintensity extent is normal for this age group. No cortical encephalomalacia or chronic cerebral blood products identified.   No abnormal enhancement identified. No dural thickening or enhancement.   Vascular: Major intracranial vascular flow voids are stable. Major dural venous sinuses are enhancing and appear to be patent.   Skull and upper cervical spine: Stable visible cervical spine with mild C3-C4 disc degeneration, spondylolisthesis. Visualized bone marrow signal is within normal limits.    Sinuses/Orbits: Stable, negative.   Other: Mastoids remain clear. Visible internal auditory structures appear normal. Negative visible scalp and face.   IMPRESSION: Stable and normal for age MRI appearance of the Brain.      SENSATION: Reports of numbness left hand and foot  COORDINATION: Nose to finger   EDEMA:  Both feet swell   MUSCLE TONE: wfl    DTRs:  Not tested at eval  POSTURE: rounded shoulders and forward head  LOWER EXTREMITY ROM:     Active  Right Eval Left Eval  Hip flexion    Hip extension    Hip abduction    Hip adduction    Hip internal rotation    Hip external rotation    Knee flexion    Knee extension    Ankle dorsiflexion    Ankle plantarflexion    Ankle inversion    Ankle eversion     (Blank rows = not tested) UPPER AND LOWER EXTREMITY MMT:        MMT Right Eval Left Eval  Shoulder flexion  4+ 4+  Bicep (elbow flexion) 5 4  Tricep (elbow extension) 5 4  Hip flexion 4+ 4+  Hip extension    Hip abduction    Hip adduction    Hip internal rotation    Hip external rotation    Knee flexion    Knee extension 4- (OA knee) 5  Ankle dorsiflexion 4+ 4+  Ankle plantarflexion    Ankle inversion    Ankle eversion    (Blank rows = not tested)  TRANSFERS: Assistive device utilized: None  Sit to stand: Modified independence Stand to sit: Modified independence Chair to chair: Modified independence Floor:  not tested   STAIRS: Level of Assistance: Modified independence Stair Negotiation Technique: Alternating Pattern  with No Rails Number of Stairs: 4-8  Height of Stairs: 4" to 8"  Comments: more difficulty managing descending 8" steps  GAIT: Gait pattern: WFL Distance walked: 100 ft Assistive device utilized: None Level of assistance: Modified independence Comments: slight decreased gait speed  FUNCTIONAL TESTS:  5 times sit to stand: 17.54 sec Dynamic Gait Index: 17/24 SLS Right 18 sec; left 3 sec  DGI 1. Gait level  surface (3) Normal: Walks 20', no assistive devices, good sped, no evidence for imbalance, normal gait pattern 2. Change in gait speed (2) Mild Impairment: Is able to change speed but demonstrates mild gait deviations, or not gait deviations but unable to achieve a significant change in velocity, or uses an assistive device. 3. Gait with horizontal head turns (2) Mild Impairment: Performs head turns smoothly with slight change in gait velocity, i.e., minor disruption to smooth gait path or uses walking aid. 4. Gait with vertical head turns (2) Mild Impairment: Performs head turns smoothly with slight change in gait velocity, i.e., minor disruption to smooth gait path or uses walking aid. 5. Gait and pivot turn (2) Mild Impairment: Pivot turns safely in > 3 seconds and stops with no loss of balance. 6. Step over obstacle (2) Mild Impairment: Is able to step over box, but must slow down and adjust steps to clear box safely. 7. Step around obstacles (2) Mild Impairment: Is able to step around both cones, but must slow down and adjust steps to clear cones. 8. Stairs (2) Mild Impairment: Alternating feet, must use rail.  TOTAL SCORE: 17 / 24   PATIENT SURVEYS:  Stroke Impact Scale 263  TODAY'S TREATMENT:                                                                                                                              DATE: 12/16/22 Standing:  Heelraise 20X  Toeraise 20X  Tandem stance on foam 3X30" each  SLS on foam max Rt:15" Lt:15" in 30" interval  Vectors 1 HHA 10X5"  7" Forward step up with opposite LE power up 10X bil  7" lateral step up bil 10X  Lunges onto 7" step no UE assist 10X each Sit to stands 10X no UE assist   12/10/22 Goal review/HEP Standing:  heelraise/toeraise 15X each  Vector stance 10X5" each with 1 HHA  Tandem  stance on foam 2X30" each  SLS max 30" Rt  20" Lt  7" Forward step up with opposite LE power up 10X bil  7" lateral step up bil 10X    11/22/22 physical therapy evaluation and HEP    PATIENT EDUCATION: Education details: Patient educated on exam findings, POC, scope of PT, HEP, and what to expect next visit. Person educated: Patient Education method: Explanation, Demonstration, and Handouts Education comprehension: verbalized understanding, returned demonstration, verbal cues required, and tactile cues required  HOME EXERCISE PROGRAM: Access Code: [email or web address] URL: [email or web address] Date: 11/22/2022 Prepared by: [facility] - [facility]  Exercises - Single Leg Stance  - 2-3 x daily - 7 x weekly - 1 sets - 10 reps - 30 sec hold  GOALS: Goals reviewed with patient? Yes  SHORT TERM GOALS: Target date: 12/13/2022  patient will be independent with initial HEP   Baseline: Goal status: MET  2.  Patient will self report 30% improvement to improve tolerance for functional activity  Baseline:  Goal status: MET  LONG TERM GOALS: Target date: 01/03/2023  Patient will be independent in self management strategies to improve quality of life and functional outcomes.  Baseline:  Goal status: IN PROGRESS  2.  Patient will self report 50% improvement to improve tolerance for functional activity  Baseline:  Goal status: IN PROGRESS  3.  Patient will be able to stand on each leg 30" SLS to demonstrate improved functional balance Baseline: right 18"; left 3 " Goal status: IN PROGRESS  4.  Patient will improve DGI to 20/24 to demonstrate improved functional gait and balance Baseline: 17/24 Goal status: IN PROGRESS   ASSESSMENT:  CLINICAL IMPRESSION: Pt arrived late for appt today.  Compliance reported with HEP.  Continued with LE strengthening and stability exercises.  Increased difficulty of SLS with use of foam.  Max of 15" hold either LE before having to use UE assist to balance self.  Tandem on foam also remains challenging.  Verbal and tactile cues needed for form and upright posturing with therex. Added  lunges without UE to work on stability and sit to stands to work on quad and glute strength.  Pt with some discomfort in knees with sit to stands but without any other complaints this session.  Patient will continue to benefit from skilled physical therapy services to address deficits and improve level of function with ADLs, functional mobility tasks, and reduce risk for falls.   OBJECTIVE IMPAIRMENTS: Abnormal gait, decreased activity tolerance, decreased balance, decreased endurance, difficulty walking, decreased strength, impaired perceived functional ability, and pain.   ACTIVITY LIMITATIONS: carrying, lifting, bending, sitting, standing, squatting, sleeping, stairs, locomotion level, and caring for others  PARTICIPATION LIMITATIONS: meal prep, cleaning, laundry, shopping, community activity, occupation, and yard work    [facility] POTENTIAL: Good  CLINICAL DECISION MAKING: Evolving/moderate complexity  EVALUATION COMPLEXITY: Moderate  PLAN:  PT FREQUENCY: 1x/week  PT DURATION: 6 weeks  PLANNED INTERVENTIONS: Therapeutic exercises, Therapeutic activity, Neuromuscular re-education, Balance training, Gait training, Patient/Family education, Joint manipulation, Joint mobilization, Stair training, Orthotic/Fit training, DME instructions, Aquatic Therapy, Dry Needling, Electrical stimulation, Spinal manipulation, Spinal mobilization, Cryotherapy, Moist heat, Compression bandaging, scar mobilization, Splintting, Taping, Traction, Ultrasound, Ionotophoresis 4mg /ml Dexamethasone, and Manual therapy   PLAN FOR NEXT SESSION: continue with higher level balance and gait training; general strengthening.  Add gait challenges on line and tandem on balance beam next session.  Single leg Tband pulls.   7:58 AM, 12/16/22 Lurena Nida, PTA/CLT [facility]  Camp[place] Ph: [phone]

## 2022-12-18 ENCOUNTER — Ambulatory Visit (HOSPITAL_COMMUNITY): Payer: Medicaid Other | Admitting: Physical Therapy

## 2022-12-23 ENCOUNTER — Ambulatory Visit (HOSPITAL_COMMUNITY): Payer: Medicaid Other

## 2022-12-24 ENCOUNTER — Ambulatory Visit (HOSPITAL_COMMUNITY): Payer: Medicaid Other

## 2022-12-24 ENCOUNTER — Other Ambulatory Visit: Payer: Self-pay | Admitting: Orthopedic Surgery

## 2022-12-24 DIAGNOSIS — R262 Difficulty in walking, not elsewhere classified: Secondary | ICD-10-CM

## 2022-12-24 DIAGNOSIS — R2689 Other abnormalities of gait and mobility: Secondary | ICD-10-CM

## 2022-12-24 NOTE — Therapy (Signed)
OUTPATIENT PHYSICAL THERAPY TREATMENT   Patient Name: Jaime Massey MRN: [id number] DOB:March 10, 1976, 47 y.o., female Today's Date: 12/24/2022   PCP: [name], DO REFERRING PROVIDER:  Micki Riley, MD [facility] none    END OF SESSION:  PT End of Session - 12/24/22 0748     Visit Number 4    Number of Visits 6    Date for PT Re-Evaluation 01/03/23    Authorization Type Medicaid; 12 visits approved 5/17-7/16    Authorization - Visit Number 3    Authorization - Number of Visits 12    Progress Note Due on Visit 6    PT Start Time 0746   late check in   PT Stop Time 0814    PT Time Calculation (min) 28 min    Activity Tolerance Patient tolerated treatment well    Behavior During Therapy [facility] for tasks assessed/performed             Past Medical History:  Diagnosis Date   Anxiety    Arthritis    Asthma due to environmental allergies    pt states she has never had asthma problems    Depression    DVT (deep venous thrombosis) (HCC)    post c-section   Migraine    PE (pulmonary embolism)    post c-section   Seizures (HCC)    as child from severe ear infections- no meds now and no seizures since age 89   Warfarin anticoagulation    took from 03/2010-12/2010.    Past Surgical History:  Procedure Laterality Date   CESAREAN SECTION     CHOLECYSTECTOMY N/A 12/21/2012   Procedure: LAPAROSCOPIC CHOLECYSTECTOMY;  Surgeon: Fabio Bering, MD;  Location: [facility];  Service: General;  Laterality: N/A;   KNEE ARTHROSCOPY WITH MEDIAL MENISECTOMY Right 03/05/2017   Procedure: chondroplasty patella, medial femoral condyle;  Surgeon: Vickki Hearing, MD;  Location: [facility];  Service: Orthopedics;  Laterality: Right;   TEE WITHOUT CARDIOVERSION N/A 12/03/2022   Procedure: TRANSESOPHAGEAL ECHOCARDIOGRAM;  Surgeon: Parke Poisson, MD;  Location: [facility] INVASIVE CV LAB;  Service: Cardiovascular;  Laterality: N/A;   Patient Active Problem List   Diagnosis Date  Noted   Acute CVA (cerebrovascular accident) (HCC) 06/19/2022   History of pulmonary embolus (PE) 06/19/2022   Spinal stenosis of cervical region 08/30/2020   Numbness and tingling of right arm and leg 07/31/2020   S/P knee surgery patella chondroplasty 03/05/17 02/12/2018   Obesity 12/19/2017   Chondromalacia of lateral femoral condyle, right    Chondromalacia of medial condyle of right femur    Chondromalacia patellae of right knee    Rupture of anterior cruciate ligament of right knee    Cyst of anterior horn of lateral meniscus of right knee    Adjustment disorder with anxious mood 02/15/2017   Bandemia without diagnosis of specific infection 02/15/2017   Pulmonary embolus (HCC) 02/15/2017   FUO (fever of unknown origin) 02/15/2017   New daily persistent headache 02/15/2017   Arthritis of knee, right 12/26/2016   Bone spur 12/03/2016   Fever of unknown origin 10/01/2016    ONSET DATE: 06/19/2022  REFERRING DIAG: R29.90 (ICD-10-CM) - Stroke-like episode R20.2 (ICD-10-CM) - Paresthesia  THERAPY DIAG:  Difficulty in walking, not elsewhere classified  Other abnormalities of gait and mobility  Rationale for Evaluation and Treatment: Rehabilitation  SUBJECTIVE:  SUBJECTIVE STATEMENT: "Sorry I'm late had to get the kids situated"  Evaluation: Patient with facial droop right side went to [facility]; TPA resolved symptoms the next day and she was discharged the next day; her neurologist Aheron referred to [facility]; wants to do MRI of the spine but needs a course of therapy first; sees cardiologist Monday;   residual paraesthesia left hand and foot; foot goes numb every day;  Pt accompanied by: self  PERTINENT HISTORY:  2021 migraine with aura that may have been stroke? But was right side symptoms  at that time  valve leaking heart Bilateral knee OA  PAIN:  Are you having pain? No  PRECAUTIONS: None  WEIGHT BEARING RESTRICTIONS: No  FALLS: Has patient fallen in last 6 months? No  PLOF: Independent  PATIENT GOALS: return to normal  OBJECTIVE:    COGNITION:Within functional limits for tasks assessed   DIAGNOSTIC FINDINGS:   CLINICAL DATA:  47 year old female status post code stroke presentation last month. TIA, morning headaches, tongue deviation, left side numbness, facial droop.   EXAM: MRI HEAD WITHOUT AND WITH CONTRAST   TECHNIQUE: Multiplanar, multiecho pulse sequences of the brain and surrounding structures were obtained without and with intravenous contrast.   CONTRAST:  10mL GADAVIST GADOBUTROL 1 MMOL/ML IV SOLN   COMPARISON:  Brain MRI, CTA and CT head and neck 06/19/2022.   FINDINGS: Brain: Cerebral volume is stable and within normal limits. Mild asymmetry of the lateral ventricles is most likely normal anatomic variation.   No restricted diffusion to suggest acute infarction. No midline shift, mass effect, evidence of mass lesion, ventriculomegaly, extra-axial collection or acute intracranial hemorrhage. Cervicomedullary junction and pituitary are within normal limits.   [name] Minimal (series 15, image 26), nonspecific white matter T2 and FLAIR hyperintensity extent is normal for this age group. No cortical encephalomalacia or chronic cerebral blood products identified.   No abnormal enhancement identified. No dural thickening or enhancement.   Vascular: Major intracranial vascular flow voids are stable. Major dural venous sinuses are enhancing and appear to be patent.   Skull and upper cervical spine: Stable visible cervical spine with mild C3-C4 disc degeneration, spondylolisthesis. Visualized bone marrow signal is within normal  limits.   Sinuses/Orbits: Stable, negative.   Other: Mastoids remain clear. Visible internal auditory structures appear normal. Negative visible scalp and face.   IMPRESSION: Stable and normal for age MRI appearance of the Brain.      SENSATION: Reports of numbness left hand and foot  COORDINATION: Nose to finger   EDEMA:  Both feet swell   MUSCLE TONE: wfl    DTRs:  Not tested at eval  POSTURE: rounded shoulders and forward head  LOWER EXTREMITY ROM:     Active  Right Eval Left Eval  Hip flexion    Hip extension    Hip abduction    Hip adduction    Hip internal rotation    Hip external rotation    Knee flexion    Knee extension    Ankle dorsiflexion    Ankle plantarflexion    Ankle inversion    Ankle eversion     (Blank rows = not tested) UPPER AND LOWER EXTREMITY MMT:        MMT Right Eval Left Eval  Shoulder flexion  4+ 4+  Bicep (elbow flexion) 5 4  Tricep (elbow extension) 5 4  Hip flexion 4+  4+  Hip extension    Hip abduction    Hip adduction    Hip internal rotation    Hip external rotation    Knee flexion    Knee extension 4- (OA knee) 5  Ankle dorsiflexion 4+ 4+  Ankle plantarflexion    Ankle inversion    Ankle eversion    (Blank rows = not tested)  TRANSFERS: Assistive device utilized: None  Sit to stand: Modified independence Stand to sit: Modified independence Chair to chair: Modified independence Floor:  not tested   STAIRS: Level of Assistance: Modified independence Stair Negotiation Technique: Alternating Pattern  with No Rails Number of Stairs: 4-8  Height of Stairs: 4" to 8"  Comments: more difficulty managing descending 8" steps  GAIT: Gait pattern: WFL Distance walked: 100 ft Assistive device utilized: None Level of assistance: Modified independence Comments: slight decreased gait speed  FUNCTIONAL TESTS:  5 times sit to stand: 17.54 sec Dynamic Gait Index: 17/24 SLS Right 18 sec; left 3 sec  DGI 1.  Gait level surface (3) Normal: Walks 20', no assistive devices, good sped, no evidence for imbalance, normal gait pattern 2. Change in gait speed (2) Mild Impairment: Is able to change speed but demonstrates mild gait deviations, or not gait deviations but unable to achieve a significant change in velocity, or uses an assistive device. 3. Gait with horizontal head turns (2) Mild Impairment: Performs head turns smoothly with slight change in gait velocity, i.e., minor disruption to smooth gait path or uses walking aid. 4. Gait with vertical head turns (2) Mild Impairment: Performs head turns smoothly with slight change in gait velocity, i.e., minor disruption to smooth gait path or uses walking aid. 5. Gait and pivot turn (2) Mild Impairment: Pivot turns safely in > 3 seconds and stops with no loss of balance. 6. Step over obstacle (2) Mild Impairment: Is able to step over box, but must slow down and adjust steps to clear box safely. 7. Step around obstacles (2) Mild Impairment: Is able to step around both cones, but must slow down and adjust steps to clear cones. 8. Stairs (2) Mild Impairment: Alternating feet, must use rail.  TOTAL SCORE: 17 / 24   PATIENT SURVEYS:  Stroke Impact Scale 263  TODAY'S TREATMENT:                                                                                                                              DATE: 12/24/22 Standing: Heel raises on incline 2 x 10 Toe raises on decline 2 x 10 Heel/toe walking 10 ft line x 4 Hip abduction with GTB 2 x 10 each Hip extension with GTB 2 x 10 each    12/16/22 Standing:  Heelraise 20X  Toeraise 20X  Tandem stance on foam 3X30" each  SLS on foam max Rt:15" Lt:15" in 30" interval  Vectors 1 HHA 10X5"  7" Forward step up with opposite LE power up 10X bil  7" lateral step up bil 10X  Lunges onto 7" step no UE assist 10X each Sit to stands 10X no UE assist   12/10/22 Goal review/HEP Standing:   heelraise/toeraise 15X each  Vector stance 10X5" each with 1 HHA  Tandem stance on foam 2X30" each  SLS max 30" Rt  20" Lt  7" Forward step up with opposite LE power up 10X bil  7" lateral step up bil 10X   11/22/22 physical therapy evaluation and HEP    PATIENT EDUCATION: Education details: Patient educated on exam findings, POC, scope of PT, HEP, and what to expect next visit. Person educated: Patient Education method: Explanation, Demonstration, and Handouts Education comprehension: verbalized understanding, returned demonstration, verbal cues required, and tactile cues required  HOME EXERCISE PROGRAM: Access Code: [email or web address] URL: [email or web address] Date: 12/24/2022 Prepared by: [facility]  Exercises - Single Leg Stance  - 2-3 x daily - 7 x weekly - 1 sets - 10 reps - 30 sec hold - Hip Extension with Resistance Loop  - 2-3 x daily - 7 x weekly - 2 sets - 10 reps - Hip Abduction with Resistance Loop  - 2-3 x daily - 7 x weekly - 2 sets - 10 reps - Heel Toe Raises with Counter Support  - 2-3 x daily - 7 x weekly - 2 sets - 10 reps    Access Code: [email or web address] URL: [email or web address] Date: 11/22/2022 Prepared by: [facility]  Exercises - Single Leg Stance  - 2-3 x daily - 7 x weekly - 1 sets - 10 reps - 30 sec hold  GOALS: Goals reviewed with patient? Yes  SHORT TERM GOALS: Target date: 12/13/2022  patient will be independent with initial HEP   Baseline: Goal status: MET  2.  Patient will self report 30% improvement to improve tolerance for functional activity  Baseline:  Goal status: MET  LONG TERM GOALS: Target date: 01/03/2023  Patient will be independent in self management strategies to improve quality of life and functional outcomes.  Baseline:  Goal status: IN PROGRESS  2.  Patient will self report 50% improvement to improve tolerance for functional activity  Baseline:  Goal status: IN PROGRESS  3.  Patient will be able  to stand on each leg 30" SLS to demonstrate improved functional balance Baseline: right 18"; left 3 " Goal status: IN PROGRESS  4.  Patient will improve DGI to 20/24 to demonstrate improved functional gait and balance Baseline: 17/24 Goal status: IN PROGRESS   ASSESSMENT:  CLINICAL IMPRESSION: Late arrival today; added resisted exercise today for strengthening; continued to challenge balance but performs well with tandem walking. Updated HEP.  Patient will continue to benefit from skilled physical therapy services to address deficits and improve level of function with ADLs, functional mobility tasks, and reduce risk for falls.   OBJECTIVE IMPAIRMENTS: Abnormal gait, decreased activity tolerance, decreased balance, decreased endurance, difficulty walking, decreased strength, impaired perceived functional ability, and pain.   ACTIVITY LIMITATIONS: carrying, lifting, bending, sitting, standing, squatting, sleeping, stairs, locomotion level, and caring for others  PARTICIPATION LIMITATIONS: meal prep, cleaning, laundry, shopping, community activity, occupation, and yard work    [facility] POTENTIAL: Good  CLINICAL DECISION MAKING: Evolving/moderate complexity  EVALUATION COMPLEXITY: Moderate  PLAN:  PT FREQUENCY: 1x/week  PT DURATION: 6 weeks  PLANNED INTERVENTIONS: Therapeutic exercises, Therapeutic activity, Neuromuscular re-education, Balance training, Gait training, Patient/Family education, Joint manipulation, Joint mobilization, Stair training, Orthotic/Fit training, DME instructions, Aquatic Therapy, Dry Needling, Electrical stimulation, Spinal manipulation, Spinal  mobilization, Cryotherapy, Moist heat, Compression bandaging, scar mobilization, Splintting, Taping, Traction, Ultrasound, Ionotophoresis 4mg /ml Dexamethasone, and Manual therapy   PLAN FOR NEXT SESSION: continue with higher level balance and gait training; general strengthening.  Add gait challenges on line and tandem  on balance beam next session.  Single leg Tband pulls.   8:16 AM, 12/24/22 Danilyn Cocke Small Michial Disney MPT [facility] [place] [phone]

## 2023-01-01 ENCOUNTER — Ambulatory Visit (HOSPITAL_COMMUNITY): Payer: Medicaid Other | Admitting: Physical Therapy

## 2023-01-01 DIAGNOSIS — R2689 Other abnormalities of gait and mobility: Secondary | ICD-10-CM

## 2023-01-01 DIAGNOSIS — R262 Difficulty in walking, not elsewhere classified: Secondary | ICD-10-CM | POA: Diagnosis not present

## 2023-01-01 NOTE — Therapy (Signed)
OUTPATIENT PHYSICAL THERAPY TREATMENT Progress Note/Discharge Reporting Period 11/22/22 to 01/01/23  See note below for Objective Data and Assessment of Progress/Goals.       Patient Name: Jaime Massey MRN: [id number] DOB:03-14-1976, 47 y.o., female Today's Date: 01/01/2023   PCP: [name], DO REFERRING PROVIDER:  Micki Riley, MD [facility] none    END OF SESSION:  PT End of Session - 01/01/23 0746     Visit Number 5    Number of Visits 6    Date for PT Re-Evaluation 01/03/23    Authorization Type Medicaid; 12 visits approved 5/17-7/16    Authorization - Number of Visits 12    Progress Note Due on Visit 6    PT Start Time 0740    PT Stop Time 0815    PT Time Calculation (min) 35 min    Activity Tolerance Patient tolerated treatment well    Behavior During Therapy [facility] for tasks assessed/performed             Past Medical History:  Diagnosis Date   Anxiety    Arthritis    Asthma due to environmental allergies    pt states she has never had asthma problems    Depression    DVT (deep venous thrombosis) (HCC)    post c-section   Migraine    PE (pulmonary embolism)    post c-section   Seizures (HCC)    as child from severe ear infections- no meds now and no seizures since age 61   Warfarin anticoagulation    took from 03/2010-12/2010.    Past Surgical History:  Procedure Laterality Date   CESAREAN SECTION     CHOLECYSTECTOMY N/A 12/21/2012   Procedure: LAPAROSCOPIC CHOLECYSTECTOMY;  Surgeon: Fabio Bering, MD;  Location: [facility];  Service: General;  Laterality: N/A;   KNEE ARTHROSCOPY WITH MEDIAL MENISECTOMY Right 03/05/2017   Procedure: chondroplasty patella, medial femoral condyle;  Surgeon: Vickki Hearing, MD;  Location: [facility];  Service: Orthopedics;  Laterality: Right;   TEE WITHOUT CARDIOVERSION N/A 12/03/2022   Procedure: TRANSESOPHAGEAL ECHOCARDIOGRAM;  Surgeon: Parke Poisson, MD;  Location: [facility] INVASIVE CV LAB;   Service: Cardiovascular;  Laterality: N/A;   Patient Active Problem List   Diagnosis Date Noted   Acute CVA (cerebrovascular accident) (HCC) 06/19/2022   History of pulmonary embolus (PE) 06/19/2022   Spinal stenosis of cervical region 08/30/2020   Numbness and tingling of right arm and leg 07/31/2020   S/P knee surgery patella chondroplasty 03/05/17 02/12/2018   Obesity 12/19/2017   Chondromalacia of lateral femoral condyle, right    Chondromalacia of medial condyle of right femur    Chondromalacia patellae of right knee    Rupture of anterior cruciate ligament of right knee    Cyst of anterior horn of lateral meniscus of right knee    Adjustment disorder with anxious mood 02/15/2017   Bandemia without diagnosis of specific infection 02/15/2017   Pulmonary embolus (HCC) 02/15/2017   FUO (fever of unknown origin) 02/15/2017   New daily persistent headache 02/15/2017   Arthritis of knee, right 12/26/2016   Bone spur 12/03/2016   Fever of unknown origin 10/01/2016    ONSET DATE: 06/19/2022  REFERRING DIAG: R29.90 (ICD-10-CM) - Stroke-like episode R20.2 (ICD-10-CM) - Paresthesia  THERAPY DIAG:  Difficulty in walking, not elsewhere classified  Other abnormalities of gait and mobility  Rationale for Evaluation and Treatment: Rehabilitation  SUBJECTIVE:  SUBJECTIVE STATEMENT: Pt states she feels 50% improved.  Still has the burning/tingling symptoms in her Lt palm and toes but otherwise improved and completing her HEP.  Evaluation: Patient with facial droop right side went to [facility]; TPA resolved symptoms the next day and she was discharged the next day; her neurologist Aheron referred to [facility]; wants to do MRI of the spine but needs a course of therapy first; sees cardiologist Monday;    residual paraesthesia left hand and foot; foot goes numb every day;  Pt accompanied by: self  PERTINENT HISTORY:  2021 migraine with aura that may have been stroke? But was right side symptoms at that time  valve leaking heart Bilateral knee OA  PAIN:  Are you having pain? No  PRECAUTIONS: None  WEIGHT BEARING RESTRICTIONS: No  FALLS: Has patient fallen in last 6 months? No  PLOF: Independent  PATIENT GOALS: return to normal  OBJECTIVE:    COGNITION:Within functional limits for tasks assessed   DIAGNOSTIC FINDINGS:   CLINICAL DATA:  47 year old female status post code stroke presentation last month. TIA, morning headaches, tongue deviation, left side numbness, facial droop.   EXAM: MRI HEAD WITHOUT AND WITH CONTRAST   TECHNIQUE: Multiplanar, multiecho pulse sequences of the brain and surrounding structures were obtained without and with intravenous contrast.   CONTRAST:  10mL GADAVIST GADOBUTROL 1 MMOL/ML IV SOLN   COMPARISON:  Brain MRI, CTA and CT head and neck 06/19/2022.   FINDINGS: Brain: Cerebral volume is stable and within normal limits. Mild asymmetry of the lateral ventricles is most likely normal anatomic variation.   No restricted diffusion to suggest acute infarction. No midline shift, mass effect, evidence of mass lesion, ventriculomegaly, extra-axial collection or acute intracranial hemorrhage. Cervicomedullary junction and pituitary are within normal limits.   [name] Minimal (series 15, image 26), nonspecific white matter T2 and FLAIR hyperintensity extent is normal for this age group. No cortical encephalomalacia or chronic cerebral blood products identified.   No abnormal enhancement identified. No dural thickening or enhancement.   Vascular: Major intracranial vascular flow voids are stable. Major dural venous sinuses are  enhancing and appear to be patent.   Skull and upper cervical spine: Stable visible cervical spine with mild C3-C4 disc degeneration, spondylolisthesis. Visualized bone marrow signal is within normal limits.   Sinuses/Orbits: Stable, negative.   Other: Mastoids remain clear. Visible internal auditory structures appear normal. Negative visible scalp and face.   IMPRESSION: Stable and normal for age MRI appearance of the Brain.      SENSATION: Reports of numbness left hand and foot  COORDINATION: Nose to finger   EDEMA:  Both feet swell   MUSCLE TONE: wfl    DTRs:  Not tested at eval  POSTURE: rounded shoulders and forward head  LOWER EXTREMITY ROM:     Active  Right Eval Left Eval  Hip flexion    Hip extension    Hip abduction    Hip adduction    Hip internal rotation    Hip external rotation    Knee flexion    Knee extension    Ankle dorsiflexion    Ankle plantarflexion    Ankle inversion    Ankle eversion     (Blank rows = not tested) UPPER AND LOWER EXTREMITY MMT:          MMT Right Eval Left Eval Right 01/01/23 Left 01/01/23  Shoulder flexion  4+ 4+ 5 5  Bicep (elbow flexion) 5 4 5 5   Tricep (elbow extension) 5 4 5 5   Hip flexion 4+ 4+ 5 5  Hip extension      Hip abduction      Hip adduction      Hip internal rotation      Hip external rotation      Knee flexion      Knee extension 4- (OA knee) 5 5 5   Ankle dorsiflexion 4+ 4+ 5 5  Ankle plantarflexion      Ankle inversion      Ankle eversion      (Blank rows = not tested)  TRANSFERS: Assistive device utilized: None  Sit to stand: Modified independence Stand to sit: Modified independence Chair to chair: Modified independence Floor:  not tested   STAIRS: Level of Assistance: Modified independence Stair Negotiation Technique: Alternating Pattern  with No Rails Number of Stairs: 4-8  Height of Stairs: 4" to 8"  Comments: more difficulty managing descending 8" steps  GAIT: Gait  pattern: WFL Distance walked: 100 ft Assistive device utilized: None Level of assistance: Modified independence Comments: slight decreased gait speed  FUNCTIONAL TESTS:  5 times sit to stand: 6/26:  11.33 sec;  at eval 5/17: 17.54 sec Dynamic Gait Index: 6/26:   at eval 5/17: 17/24 SLS 6/26:  Rt: 30 sec  Lt: 22" ; at eval 5/17: Rt:18 sec; lt 3 sec  DGI 1. Gait level surface (3) Normal: Walks 20', no assistive devices, good sped, no evidence for imbalance, normal gait pattern 2. Change in gait speed (2) Mild Impairment: Is able to change speed but demonstrates mild gait deviations, or not gait deviations but unable to achieve a significant change in velocity, or uses an assistive device. 3. Gait with horizontal head turns (2) Mild Impairment: Performs head turns smoothly with slight change in gait velocity, i.e., minor disruption to smooth gait path or uses walking aid. 4. Gait with vertical head turns (2) Mild Impairment: Performs head turns smoothly with slight change in gait velocity, i.e., minor disruption to smooth gait path or uses walking aid. 5. Gait and pivot turn (2) Mild Impairment: Pivot turns safely in > 3 seconds and stops with no loss of balance. 6. Step over obstacle (2) Mild Impairment: Is able to step over box, but must slow down and adjust steps to clear box safely. 7. Step around obstacles (2) Mild Impairment: Is able to step around both cones, but must slow down and adjust steps to clear cones. 8. Stairs (2) Mild Impairment: Alternating feet, must use rail.  TOTAL SCORE: 17 / 24   PATIENT SURVEYS:  Stroke Impact Scale 263  TODAY'S TREATMENT:                                                                                                                              DATE: 01/01/23 Progress note MMT 5 times sit to  stand: 6/26:  11.33 sec;  at eval 5/17: 17.54 sec Dynamic Gait Index: 6/26:   at eval 5/17: 17/24 SLS 6/26:  Rt: 30 sec  Lt: 22" ; at eval 5/17:  Rt:18 sec; lt 3 sec Goal review; discharge DGI 1. Gait level surface (3) Normal: Walks 20', no assistive devices, good sped, no evidence for imbalance, normal gait pattern 2. Change in gait speed (3) Normal: Able to smoothly change walking speed without loss of balance or gait deviation. Shows a significant difference in walking speeds between normal, fast and slow speeds. 3. Gait with horizontal head turns (3) Normal: Performs head turns smoothly with no change in gait. 4. Gait with vertical head turns (3) Normal: Performs head turns smoothly with no change in gait. 5. Gait and pivot turn (3) Normal: Pivot turns safely within 3 seconds and stops quickly with no loss of balance. 6. Step over obstacle (3) Normal: Is able to step over the box without changing gait speed, no evidence of imbalance. 7. Step around obstacles (3) Normal: Is able to walk around cones safely without changing gait speed; no evidence of imbalance. 8. Stairs (3) Normal: Alternating feet, no rail.  TOTAL SCORE: 24 / 24   12/24/22 Standing: Heel raises on incline 2 x 10 Toe raises on decline 2 x 10 Heel/toe walking 10 ft line x 4 Hip abduction with GTB 2 x 10 each Hip extension with GTB 2 x 10 each  12/16/22 Standing:  Heelraise 20X  Toeraise 20X  Tandem stance on foam 3X30" each  SLS on foam max Rt:15" Lt:15" in 30" interval  Vectors 1 HHA 10X5"  7" Forward step up with opposite LE power up 10X bil  7" lateral step up bil 10X  Lunges onto 7" step no UE assist 10X each Sit to stands 10X no UE assist   12/10/22 Goal review/HEP Standing:  heelraise/toeraise 15X each  Vector stance 10X5" each with 1 HHA  Tandem stance on foam 2X30" each  SLS max 30" Rt  20" Lt  7" Forward step up with opposite LE power up 10X bil  7" lateral step up bil 10X   11/22/22 physical therapy evaluation and HEP    PATIENT EDUCATION: Education details: Patient educated on exam findings, POC, scope of PT, HEP, and what to  expect next visit. Person educated: Patient Education method: Explanation, Demonstration, and Handouts Education comprehension: verbalized understanding, returned demonstration, verbal cues required, and tactile cues required  HOME EXERCISE PROGRAM: Access Code: [email or web address] URL: [email or web address] Date: 12/24/2022 Prepared by: [facility]  Exercises - Single Leg Stance  - 2-3 x daily - 7 x weekly - 1 sets - 10 reps - 30 sec hold - Hip Extension with Resistance Loop  - 2-3 x daily - 7 x weekly - 2 sets - 10 reps - Hip Abduction with Resistance Loop  - 2-3 x daily - 7 x weekly - 2 sets - 10 reps - Heel Toe Raises with Counter Support  - 2-3 x daily - 7 x weekly - 2 sets - 10 reps    Access Code: [email or web address] URL: [email or web address] Date: 11/22/2022 Prepared by: [facility]  Exercises - Single Leg Stance  - 2-3 x daily - 7 x weekly - 1 sets - 10 reps - 30 sec hold  GOALS: Goals reviewed with patient? Yes  SHORT TERM GOALS: Target date: 12/13/2022  patient will be independent with initial HEP   Baseline: Goal status: MET  2.  Patient will  self report 30% improvement to improve tolerance for functional activity  Baseline:  Goal status: MET  LONG TERM GOALS: Target date: 01/03/2023  Patient will be independent in self management strategies to improve quality of life and functional outcomes.  Baseline:  Goal status: MET  2.  Patient will self report 50% improvement to improve tolerance for functional activity  Baseline:  Goal status: MET  3.  Patient will be able to stand on each leg 30" SLS to demonstrate improved functional balance Baseline: right 18"; left 3 " Goal status: NOT MET/PARTLY MET  Rt: 30" , Lt" 22"  4.  Patient will improve DGI to 20/24 to demonstrate improved functional gait and balance Baseline: 17/24 Goal status: MET   ASSESSMENT:  CLINICAL IMPRESSION: Late arrival today.  Progress note completed this session . Pt  has completed 6 weeks of physical therapy with good progress made.  Strength and balance is now WNL and all goals met with exception of Lt single leg stance as she was only able to maintain max of 22 seconds (goal 30 seconds).  Pt is independent with and advanced HEP and is completing these.  Remaining issue is continued burning/cramping in her toes on Lt as well as burning in the palm of her Lt hand.  Pt is hopeful of more studies to determine the cause of these symptoms.  Pt with no further skilled needs and is ready for discharge.    OBJECTIVE IMPAIRMENTS: Abnormal gait, decreased activity tolerance, decreased balance, decreased endurance, difficulty walking, decreased strength, impaired perceived functional ability, and pain.   ACTIVITY LIMITATIONS: carrying, lifting, bending, sitting, standing, squatting, sleeping, stairs, locomotion level, and caring for others  PARTICIPATION LIMITATIONS: meal prep, cleaning, laundry, shopping, community activity, occupation, and yard work    [facility] POTENTIAL: Good  CLINICAL DECISION MAKING: Evolving/moderate complexity  EVALUATION COMPLEXITY: Moderate  PLAN:  PT FREQUENCY: 1x/week  PT DURATION: 6 weeks  PLANNED INTERVENTIONS: Therapeutic exercises, Therapeutic activity, Neuromuscular re-education, Balance training, Gait training, Patient/Family education, Joint manipulation, Joint mobilization, Stair training, Orthotic/Fit training, DME instructions, Aquatic Therapy, Dry Needling, Electrical stimulation, Spinal manipulation, Spinal mobilization, Cryotherapy, Moist heat, Compression bandaging, scar mobilization, Splintting, Taping, Traction, Ultrasound, Ionotophoresis 4mg /ml Dexamethasone, and Manual therapy   PLAN FOR NEXT SESSION: Discharge  7:47 AM, 01/01/23 Lurena Nida, PTA/CLT [facility] Ph: [phone]

## 2023-01-06 ENCOUNTER — Encounter (HOSPITAL_COMMUNITY): Payer: Self-pay

## 2023-01-06 ENCOUNTER — Telehealth: Payer: Self-pay | Admitting: Neurology

## 2023-01-06 NOTE — Therapy (Signed)
[facility] at [facility] [address] [place], [place], [address] Phone: [phone]   Fax:  [phone]  Patient Details  Name: TORYN GUNDER MRN: [id number] Date of Birth: [birth year] Referring Provider:  No ref. provider found  PHYSICAL THERAPY DISCHARGE SUMMARY  Visits from Start of Care: 5  Current functional level related to goals / functional outcomes: See below   Remaining deficits: See below   Education / Equipment: See below   Patient agrees to discharge. Patient goals were partially met. Patient is being discharged due to being pleased with the current functional level.  [facility] Outpatient Rehabilitation at [facility] [address] [place], [place], [address] Phone: [phone]   Fax:  [phone]

## 2023-01-06 NOTE — Telephone Encounter (Signed)
Pt has done her 6 weeks of therapy and would like to know be scheduled for her MRI

## 2023-01-13 ENCOUNTER — Telehealth: Payer: Medicaid Other | Admitting: Neurology

## 2023-01-13 NOTE — Telephone Encounter (Signed)
I submitted a new prior authorization request to her insurance and faxed the PT notes. Pending medical review.

## 2023-01-14 ENCOUNTER — Telehealth (INDEPENDENT_AMBULATORY_CARE_PROVIDER_SITE_OTHER): Payer: Medicaid Other | Admitting: Neurology

## 2023-01-14 ENCOUNTER — Telehealth: Payer: Self-pay | Admitting: Neurology

## 2023-01-14 ENCOUNTER — Other Ambulatory Visit: Payer: Self-pay | Admitting: *Deleted

## 2023-01-14 DIAGNOSIS — R202 Paresthesia of skin: Secondary | ICD-10-CM

## 2023-01-14 DIAGNOSIS — Z79899 Other long term (current) drug therapy: Secondary | ICD-10-CM

## 2023-01-14 DIAGNOSIS — G43709 Chronic migraine without aura, not intractable, without status migrainosus: Secondary | ICD-10-CM

## 2023-01-14 NOTE — Telephone Encounter (Signed)
Jillian, please call and schdule patient for emg/ncs mid to late August thanks

## 2023-01-14 NOTE — Telephone Encounter (Signed)
I called the patient and got her scheduled for the first available EMG/NCV that I could find with Dr Lucia Gaskins for 03/24/23 at 1:30 PM.

## 2023-01-14 NOTE — Telephone Encounter (Signed)
Please call patient, she needs a urine preg test and while she is here we can inject her with Ajovy while we wait for approval from insurance. Nurse visit.

## 2023-01-14 NOTE — Telephone Encounter (Signed)
I placed the order for the urine pregnancy test. Pt is amenable to coming in tomorrow. I placed her on the lab and nurse schedule. I was also able to schedule the patient for her EMG/NCV on 9/16 at 1:30 pm arrival 1-1:15 pm. Pt verbalized appreciation for the call and her questions were answered.

## 2023-01-14 NOTE — Progress Notes (Unsigned)
[facility]    Provider:  Dr Lucia Gaskins Requesting Provider: [name], DO Primary Care Provider:  [name], DO  CC:  Numbness  Virtual Visit via Video Note  I connected with Jaime Massey on 01/15/23 at 11:00 AM EDT by a video enabled telemedicine application and verified that I am speaking with the correct person using two identifiers.  Location: Patient: home Provider: office   I discussed the limitations of evaluation and management by telemedicine and the availability of in person appointments. The patient expressed understanding and agreed to proceed.   Follow Up Instructions:    I discussed the assessment and treatment plan with the patient. The patient was provided an opportunity to ask questions and all were answered. The patient agreed with the plan and demonstrated an understanding of the instructions.   The patient was advised to call back or seek an in-person evaluation if the symptoms worsen or if the condition fails to improve as anticipated.  I provided over 30 minutes of non-face-to-face time during this encounter.   Anson Fret, MD   01/14/2023: 25 headache days a month. 12 moderate to severe migraines can last 24-72 hours. Ajovy or emgality. She has a mirena IUD. May need a urine test. Nurses call and schedule an appt to inject you on AJovy and take a urine test. Waiting on mri cervical spine.  If the MRI cervical spine is negative, do an emg/ncs. Also have office call to schedule emg/ncs and can cancel if we find our answers in the mri cervical spine.  We discussed at length. Nurse will call and schedule an appt to inject her on AJovy and take a urine test.  From a thorough review of records, meds tried >59months that can be used for migraines/headaches include: tylenol, welbutrin,buspar, gabapentin, advil,lamictal,meloxicam, zofran,prednisone,trazodone, topamax, tramadol, BP meds contraindicated due to hypotension,  amitriptyline/nortriptyline contraindicated due to already being on seratonergic drugs and risk of seratonin syndrome, aimovig contraindicated due to constipation  Reviewed MRI brain images and agree: 07/19/2022:FINDINGS: Brain: Cerebral volume is stable and within normal limits. Mild asymmetry of the lateral ventricles is most likely normal anatomic variation.   No restricted diffusion to suggest acute infarction. No midline shift, mass effect, evidence of mass lesion, ventriculomegaly, extra-axial collection or acute intracranial hemorrhage. Cervicomedullary junction and pituitary are within normal limits.   [name] Minimal (series 15, image 26), nonspecific white matter T2 and FLAIR hyperintensity extent is normal for this age group. No cortical encephalomalacia or chronic cerebral blood products identified.   No abnormal enhancement identified. No dural thickening or enhancement.   Vascular: Major intracranial vascular flow voids are stable. Major dural venous sinuses are enhancing and appear to be patent.   Skull and upper cervical spine: Stable visible cervical spine with mild C3-C4 disc degeneration, spondylolisthesis. Visualized bone marrow signal is within normal limits.   Sinuses/Orbits: Stable, negative.   Other: Mastoids remain clear. Visible internal auditory structures appear normal. Negative visible scalp and face.   IMPRESSION: Stable and normal for age MRI appearance of the Brain.  Patient complains of symptoms per HPI as well as the following symptoms: migraines, constipation . Pertinent negatives and positives per HPI. All others negative   07/02/2022: We saw patient in 2021 for numbness. We completed an entire stroke workup. MRI brain and c-spine,emg/ncs, echo, CTA H&N, TCD in 2021 wasnormal an right-sided. 05/22/2022 She was  at work and left cheek went  numb, she had a facial droop, [facility], she had Procedures:CT head, Echocardiogram,MRI brain,CTA head and neck with contrast, CT head without contrast. She had a very bad headaches. She has headaches, she has a hx of migraines, she had a sharp pain on the left side of the head, followed by a severe headache for days, left side, light and sound sensitivity. They gave her TPA. Tobgue deviation, She wa still having facial droop during the imaging and tongue to the right lasted for 24 hours, arm and left le weak, she was not on aspirin, nothing since 2021. No palpitations. She is wearing a 14-day heart monitor. Snoring is terrible, partner will not sleep in the same room, he even recorded it, wakes with morning headache and dry mouth. She was having a difficult time talking, sounds like dysarthri and not aphasia. Everything is resolved. No other focal neurologic deficits, associated symptoms, inciting events or modifiable factors.   Patient complains of symptoms per HPI as well as the following symptoms: TIA . Pertinent negatives and positives per HPI. All others negative   HPI 05/16/2020:  Jaime Massey is a 47 y.o. female here as requested by [name], DO for "Numbness and tingling in arms and legs needs NCV".  Past medical history migraine with aura, anxiety, obesity, asthma, chronic left knee pain, depression, daily persistent headache onset August 17, 2016 and fever of unknown origin, history of pulmonary embolus in 2011 6 weeks status post C-section, tobacco abuse, prior warfarin use.  I reviewed Dr. Sherilyn Dacosta examination which showed normal general, HEENT, neck, heart, lungs, abdomen, extremities, skin, neuro and psych examinations.  I reviewed Dr. Verna Czech notes: Patient reports tingling on the right side, a few weeks prior she had hip pain and popping, reports hip pain during appointment, has numbness in her right leg and right arm, she is also having headaches which are chronic, blood  pressure was good today, has not had her eyes checked this year, she reports being at home since June 2020, feels like she is always stressed, more so with her grown children who are 24, 22 and 17, she does report a lot of stress.  Patient is here alone and she reports numbness, in August 2021 she said her right side went numb at the end of vacation, she saw her doctor and she started gabapentin, does help some but she still has the numbness and feels it is in her hands and foot, it is not the sharp tingling as it was before, she also has chronic headaches.In August they went on vacation, no significant exercise, she took a shower one evening and her right foot was swollen like a balloon, she went to bed and whole right side was numb, face, arm and leg, she drove home and thought she was going to have to pull over. She was dizzy, no known facial droop, no vision changes or talking problems, it never resolved, Gabapentin may help with the symptoms its a sharp tingling but her hand and right foot are still numb. Acute onset. When her right foot was swollen the lower leg sore. Symptoms are in the entire hand on the right, right foot feels different. The right side of the face was definitely involved initially. 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   Reviewed notes, labs and imaging from outside physicians, which showed:  MRI  of the brain wo contrast: reviewed report no acute intracranial process  04/12/2020: CBC and CMP  unremarkable, BUN 9, Creat 0.69, hgba1c 5.2 02/23/2020: Hgba1c 5.2, TSH 0.831  Review of Systems: Patient complains of symptoms per HPI as well as the following symptoms: Blurred vision, feeling hot, feeling cold, increased thirst, snoring, swelling in legs, fatigue, ringing in ears, constipation, joint pain, joint swelling, cramps, aching muscles, headache, numbness, weakness, dizziness, sleepiness, restless legs, depression,  anxiety, too much sleep, not in of sleep, disinterest in activity and decreased energy. Pertinent negatives and positives per HPI. All others negative.   Social History   Socioeconomic History   Marital status: Significant Other    Spouse name: Not on file   Number of children: 4   Years of education: Not on file   Highest education level: Some college, no degree  Occupational History   Not on file  Tobacco Use   Smoking status: Former    Packs/day: 0.50    Years: 26.00    Additional pack years: 0.00    Total pack years: 13.00    Types: Cigarettes    Quit date: 10/2017    Years since quitting: 5.2   Smokeless tobacco: Never  Vaping Use   Vaping Use: Never used  Substance and Sexual Activity   Alcohol use: Yes    Alcohol/week: 2.0 standard drinks of alcohol    Types: 2 Cans of beer per week    Comment: reports occasional beer but very seldom   Drug use: No   Sexual activity: Yes    Birth control/protection: I.U.D.  Other Topics Concern   Not on file  Social History Narrative   Lives at home with son and boyfriend   Right handed   Caffeine: soda daily   Social Determinants of Health   Financial Resource Strain: Not on file  Food Insecurity: Not on file  Transportation Needs: Not on file  Physical Activity: Not on file  Stress: Not on file  Social Connections: Not on file  Intimate Partner Violence: Not on file    Family History  Problem Relation Age of Onset   Breast cancer Maternal Grandmother    Skin cancer Maternal Grandmother    Cancer Maternal Grandfather    Diabetes Paternal Grandmother    Heart disease Paternal Grandfather    Breast cancer Mother    Aortic aneurysm Father    Stroke Neg Hx        none aware of    Neuropathy Neg Hx        none aware of     Past Medical History:  Diagnosis Date   Anxiety    Arthritis    Asthma due to environmental allergies    pt states she has never had asthma problems    Depression    DVT (deep venous  thrombosis) (HCC)    post c-section   Migraine    PE (pulmonary embolism)    post c-section   Seizures (HCC)    as child from severe ear infections- no meds now and no seizures since age 11   Warfarin anticoagulation    took from 03/2010-12/2010.     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    Chondromalacia patellae of right knee    Rupture of anterior cruciate ligament of right knee    Cyst of anterior horn of lateral meniscus of right knee    Adjustment disorder with anxious mood 02/15/2017   Bandemia without diagnosis of specific infection 02/15/2017   Pulmonary embolus (HCC) 02/15/2017   FUO (fever of unknown origin) 02/15/2017   New daily persistent headache 02/15/2017   Arthritis of knee, right 12/26/2016   Bone spur 12/03/2016   Fever of unknown origin 10/01/2016    Past Surgical History:  Procedure Laterality Date   CESAREAN SECTION     CHOLECYSTECTOMY N/A 12/21/2012   Procedure: LAPAROSCOPIC CHOLECYSTECTOMY;  Surgeon: Fabio Bering, MD;  Location: [facility];  Service: General;  Laterality: N/A;   KNEE ARTHROSCOPY WITH MEDIAL MENISECTOMY Right 03/05/2017   Procedure: chondroplasty patella, medial femoral condyle;  Surgeon: Vickki Hearing, MD;  Location: [facility];  Service: Orthopedics;  Laterality: Right;   TEE WITHOUT CARDIOVERSION N/A 12/03/2022   Procedure: TRANSESOPHAGEAL ECHOCARDIOGRAM;  Surgeon: Parke Poisson, MD;  Location: [facility] INVASIVE CV LAB;  Service: Cardiovascular;   Laterality: N/A;    Current Outpatient Medications  Medication Sig Dispense Refill   Fremanezumab-vfrm (AJOVY) 225 MG/1.5ML SOAJ Inject 225 mg into the skin every 30 (thirty) days. 1.5 mL 11   acetaminophen (TYLENOL) 500 MG tablet Take 1,500 mg by mouth in the morning.     aspirin EC 81 MG tablet Take 1 tablet (81 mg total) by mouth daily. Swallow whole. (Patient taking differently: Take 81 mg by mouth at bedtime. Swallow whole.) 30 tablet 11   buPROPion (WELLBUTRIN XL) 150 MG 24 hr tablet Take 150 mg by mouth daily.     busPIRone (BUSPAR) 5 MG tablet Take 5 mg by mouth 3 (three) times daily.     Fremanezumab-vfrm (AJOVY) 225 MG/1.5ML SOAJ Inject 225 mg into the skin every 30 (thirty) days. 1.5 mL 0   gabapentin (NEURONTIN) 100 MG capsule Take 100 mg by mouth at bedtime.     levonorgestrel (MIRENA, 52 MG,) 20 MCG/24HR IUD 1 each by Intrauterine route once.     meloxicam (MOBIC) 7.5 MG tablet TAKE 1 TABLET BY MOUTH IN THE MORNING AND AT BEDTIME 60 tablet 0   omeprazole (PRILOSEC) 20 MG capsule Take 20 mg by mouth daily as needed (Heatburn).     predniSONE (DELTASONE) 10 MG tablet TAKE 1 TABLET BY MOUTH ONCE DAILY AS NEEDED FOR SWELLING IN RIGHT KNEE IF NOT IMPROVED WITH IBUPROFEN 60 tablet 0   RESTASIS 0.05 % ophthalmic emulsion Place 1 drop into both eyes daily as needed (allergies in eyes).     traZODone (DESYREL) 50 MG tablet Take 50 mg by mouth at bedtime.     No current facility-administered medications for this visit.   Facility-Administered Medications Ordered in Other Visits  Medication Dose Route Frequency Provider Last Rate Last Admin   sodium chloride flush (NS) 0.9 % injection 16 mL  16 mL Intracatheter PRN Anson Fret, MD        Allergies as of 01/14/2023 - Review Complete 12/03/2022  Allergen Reaction Noted   Amoxicillin Hives 02/03/2017   Topiramate  11/07/2022   Penicillins Rash 06/25/2014    Vitals: There were no vitals taken for this visit. Last Weight:  Wt  Readings from Last 1 Encounters:  12/03/22 230 lb (104.3 kg)   Last Height:   Ht Readings from Last 1 Encounters:  12/03/22 5\' 9"  (1.753 m)    Physical exam: Exam: Gen: NAD, conversant, well nourised, obese, well groomed                     CV: RRR, no MRG. No Carotid Bruits. No peripheral edema, warm, nontender Eyes: Conjunctivae clear without exudates or hemorrhage  Neuro: Detailed Neurologic Exam  Speech:    Speech is normal; fluent and spontaneous with normal comprehension.  Cognition:    The patient is oriented to person, place, and time;     recent and remote memory intact;     language fluent;     normal attention, concentration,     fund of knowledge Cranial Nerves:    The pupils are equal, round, and reactive to light. The fundi are normal and spontaneous venous pulsations are present. Visual fields are full to finger confrontation. Extraocular movements are intact. Trigeminal sensation is intact and the muscles of mastication are normal. The face is symmetric. The palate elevates in the midline. Hearing intact. Voice is normal. Shoulder shrug is normal. The tongue has normal motion without fasciculations.   Coordination: nml  Gait: nml  Motor Observation:    No asymmetry, no atrophy, and no involuntary movements noted. Tone:    Normal muscle tone.    Posture:    Posture is normal. normal erect    Strength:    Strength is V/V in the upper and lower limbs.      Sensation: intact to LT     Reflex Exam:  DTR's:    Deep tendon reflexes in the upper and lower extremities are normal bilaterally.   Toes:    The toes are downgoing bilaterally.   Clonus:    Clonus is absent.     Assessment/Plan:   4We saw patient in 2021 for numbness. We completed an entire stroke workup. MRI brain and c-spine,emg/ncs, echo, CTA H&N, TCD in 2021 wasnormal an right-sided. 05/22/2022 She was at work and left cheek went numb, she had a facial droop, [facility], she had  Procedures:CT head, Echocardiogram,MRI brain,CTA head and neck with contrast, CT head without contrast. All negative.  She had a very bad headaches. She has headaches, she has a hx of migraines, she had a sharp pain on the left side of the head, followed by a severe headache for days, left side, light and sound sensitivity. They gave her TPA. Tobgue deviation, She wa still having facial droop during the imaging and tongue to the right lasted for 24 hours, arm and left le weak, she was not on aspirin, nothing since 2021. No palpitations. She is wearing a 14-day heart monitor. Snoring is terrible, partner will not sleep in the same room, he even recorded it, wakes with morning headache and dry mouth. She was having a difficult time talking, sounds like dysarthri and not aphasia. Everything is resolved. No other focal neurologic deficits, associated symptoms, inciting events or modifiable factors.  Past medical history migraine with aura, anxiety, obesity, asthma, chronic left knee pain, depression, daily persistent headache onset August 17, 2016 and fever of unknown origin, history of pulmonary embolus in 2011 6 weeks status post C-section, tobacco abuse, prior warfarin use.  Patient had acute onset left face, left arm and left leg numbness, facial droop, tongue deviation. Thorough workup x 2 (happened in 2021  right side) negative. Saw vascular neurology and scheduled an mri cervical spine and an emg/ncs if nothing is seen in the cervical spine.    Snoring is terrible, partner will not sleep in the same room, he even recorded it, wakes with morning headache and dry mouth: Sleep test, recent TIA in the hospital 12/14. Dr Frances Furbish negative "Patient understands that her sleep study did not show any significant obstructive sleep apnea. Intermittent mild to moderate snoring was detected. CPAP or AutoPap therapy treatment is not indicated. Weight loss and avoiding the supine sleep position may aid in reducing her  snoring. "  Neg for afib monitor  Bloodwork for genetic causes of blood clots negative  Second opinion to Dr. Pearlean Brownie who Is head of [facility] for second opinion: recommended mri cervical spine and ,if negative, emg/ncs  Differential included TIA vs Migraine with aura: likely migraine aura  MRI of the brain w/wo contrast: negative for stroke  Start Ajovy for chronic migraines. Do not get pregnant while pn medication and 6 months after, she acknowledged, will get a preg test From a thorough review of records, meds tried >59months that can be used for migraines/headaches include: tylenol, welbutrin,buspar, gabapentin, advil,lamictal,meloxicam, zofran,prednisone,trazodone, topamax, tramadol, BP meds contraindicated due to hypotension, amitriptyline/nortriptyline contraindicated due to already being on seratonergic drugs and risk of seratonin syndrome, aimovig contraindicated due to constipation   Orders Placed This Encounter  Procedures   Pregnancy, urine   Meds ordered this encounter  Medications   Fremanezumab-vfrm (AJOVY) 225 MG/1.5ML SOAJ    Sig: Inject 225 mg into the skin every 30 (thirty) days.    Dispense:  1.5 mL    Refill:  11    Cc: [name], DO,  Patel, [place], DO  Naomie Dean, MD  [facility] [address] [place], [place] [address]  Phone [phone] Fax [phone]

## 2023-01-15 ENCOUNTER — Telehealth: Payer: Self-pay | Admitting: Neurology

## 2023-01-15 ENCOUNTER — Other Ambulatory Visit (INDEPENDENT_AMBULATORY_CARE_PROVIDER_SITE_OTHER): Payer: Self-pay

## 2023-01-15 ENCOUNTER — Encounter: Payer: Self-pay | Admitting: Neurology

## 2023-01-15 ENCOUNTER — Ambulatory Visit (INDEPENDENT_AMBULATORY_CARE_PROVIDER_SITE_OTHER): Payer: Medicaid Other | Admitting: *Deleted

## 2023-01-15 DIAGNOSIS — G43611 Persistent migraine aura with cerebral infarction, intractable, with status migrainosus: Secondary | ICD-10-CM

## 2023-01-15 DIAGNOSIS — Z79899 Other long term (current) drug therapy: Secondary | ICD-10-CM

## 2023-01-15 DIAGNOSIS — Z7689 Persons encountering health services in other specified circumstances: Secondary | ICD-10-CM

## 2023-01-15 DIAGNOSIS — G43709 Chronic migraine without aura, not intractable, without status migrainosus: Secondary | ICD-10-CM | POA: Insufficient documentation

## 2023-01-15 DIAGNOSIS — Z0289 Encounter for other administrative examinations: Secondary | ICD-10-CM

## 2023-01-15 MED ORDER — AJOVY 225 MG/1.5ML ~~LOC~~ SOAJ
225.0000 mg | SUBCUTANEOUS | 0 refills | Status: DC
Start: 2023-01-15 — End: 2023-02-25

## 2023-01-15 MED ORDER — AJOVY 225 MG/1.5ML ~~LOC~~ SOAJ
225.0000 mg | SUBCUTANEOUS | 11 refills | Status: DC
Start: 2023-01-15 — End: 2024-03-15

## 2023-01-15 NOTE — Telephone Encounter (Signed)
I spoke with patient during video appointment. Going tp start her on Ajovy for migraines. She has medicaid so she has to come for a urine test and while she is here we can inject her with Ajovy to start her with. Would you please call and schedule a nurse visit to do this? Thanks  I discussed with her that a Nurse will call and schedule an appt to inject you with AJovy and take a urine test.

## 2023-01-15 NOTE — Telephone Encounter (Signed)
That's ok, September is fine thanks!

## 2023-01-15 NOTE — Progress Notes (Signed)
Patient arrived and was given, per v.o. Dr Lucia Gaskins, Ajovy 225 mg SQ in L upper outer arm. Patient tolerated well. She was advised of common side effect of injection site reaction. She was advised to seek emergency care for any severe reactions. Patient had pregnancy test done today. If confirmed negative, will submit PA for Ajovy so she can continue. Pt's questions were answered. Sample was documented.

## 2023-01-16 ENCOUNTER — Other Ambulatory Visit: Payer: Self-pay | Admitting: *Deleted

## 2023-01-16 ENCOUNTER — Other Ambulatory Visit (HOSPITAL_COMMUNITY): Payer: Self-pay

## 2023-01-16 ENCOUNTER — Telehealth: Payer: Self-pay

## 2023-01-16 LAB — PREGNANCY, URINE: Preg Test, Ur: NEGATIVE

## 2023-01-16 NOTE — Progress Notes (Signed)
error 

## 2023-01-16 NOTE — Telephone Encounter (Signed)
This has already been completed. 

## 2023-01-16 NOTE — Telephone Encounter (Signed)
Pharmacy Patient Advocate Encounter   Received notification from [employer] that prior authorization for AJOVY (fremanezumab-vfrm) injection 225MG /1.5ML auto-injectors is required/requested.   PA submitted to [facility] Medicaid via CoverMyMeds Key or (Medicaid) confirmation # [id number] Status is pending

## 2023-01-16 NOTE — Telephone Encounter (Signed)
Thank you! I finished my note with all meds she has tried so you should be good to go for PA now ty!

## 2023-01-17 NOTE — Telephone Encounter (Signed)
Pharmacy Patient Advocate Encounter  Received notification from [facility] Medicaid that Prior Authorization for AJOVY (fremanezumab-vfrm) injection 225MG /1.5ML auto-injectors has been DENIED because see below..     PA #/Case ID/Reference #: PA Case ID: [id number]   Please be advised we currently do not have a Pharmacist to review denials, therefore you will need to process appeals accordingly as needed. Thanks for your support at this time. Contact for appeals are as follows: Phone: [phone], Fax: [phone].  Reference Number: [id number]  The denial letter has been placed in the chart under the media tab along with appeal forms.  Thet denied stating we did not send them the information to those questions but I looked at the PA again and these questions were not on the PA at all.

## 2023-01-22 ENCOUNTER — Encounter: Payer: Self-pay | Admitting: *Deleted

## 2023-01-22 NOTE — Telephone Encounter (Signed)
[employer] medicaid Jaime Massey: [id number] exp. 01/22/23-03/14/23 sent to GI [phone]

## 2023-01-22 NOTE — Telephone Encounter (Signed)
I spoke with the patient. The appeal is complete but we need her signature on the appeal request form. I have placed the form up front. She will come by the office to sign it this Friday morning 7/19.

## 2023-01-23 ENCOUNTER — Ambulatory Visit: Payer: Medicaid Other | Admitting: Orthopedic Surgery

## 2023-01-23 DIAGNOSIS — M2241 Chondromalacia patellae, right knee: Secondary | ICD-10-CM

## 2023-01-23 DIAGNOSIS — G8929 Other chronic pain: Secondary | ICD-10-CM

## 2023-01-23 DIAGNOSIS — M94261 Chondromalacia, right knee: Secondary | ICD-10-CM

## 2023-01-23 DIAGNOSIS — Z9889 Other specified postprocedural states: Secondary | ICD-10-CM

## 2023-01-23 NOTE — Progress Notes (Unsigned)
   The patient has requested an injection   Chief Complaint  Patient presents with   Knee Pain    R/ here for an injection. Knee hurts but not as bad, depends on my activity.     Encounter Diagnoses  Name Primary?   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  The injection was performed at the lateral inferopatella portal   Medication Depomedrol 40 mg and 1% lidocaine plain   There were no complications  The patient was observed for any reactions there were none and the patient was discharged.

## 2023-01-27 ENCOUNTER — Ambulatory Visit: Payer: Medicaid Other | Admitting: Orthopedic Surgery

## 2023-01-27 NOTE — Telephone Encounter (Signed)
Pt signed the appeal request form. Appeal has been faxed to [facility]. Received a receipt of confirmation.

## 2023-02-10 NOTE — Telephone Encounter (Signed)
I spoke with patient and advised her of the Ajovy approval through 05/11/2023.  Patient will be due for her next injection on August 10.  I encouraged her to go ahead now and call [facility] to have it filled so she can maximize her benefit.  I asked the patient to check back in with [place] either at her EMG next month or anytime via MyChart, for example, to let us know how she is doing.  In early November we will have to redo the prior authorization letting insurance know how she is doing on the Ajovy and request extended approval.  The patient's questions were answered and she verbalized appreciation for the call.

## 2023-02-10 NOTE — Telephone Encounter (Signed)
Appeal has been overturned. Jaime Massey has been approved through 05/11/2023.  PA # [id number]

## 2023-02-11 ENCOUNTER — Ambulatory Visit
Admission: RE | Admit: 2023-02-11 | Discharge: 2023-02-11 | Disposition: A | Discharge status: Home / Self Care | Payer: Medicaid Other | Source: Ambulatory Visit | Attending: Neurology | Admitting: Neurology

## 2023-02-11 DIAGNOSIS — R202 Paresthesia of skin: Secondary | ICD-10-CM | POA: Diagnosis not present

## 2023-02-11 MED ORDER — GADOPICLENOL 0.5 MMOL/ML IV SOLN
10.0000 mL | Freq: Once | INTRAVENOUS | Status: AC | PRN
  Administered 2023-02-11: 10 mL via INTRAVENOUS

## 2023-02-12 NOTE — Progress Notes (Signed)
Kindly inform the patient that MRI scan of the cervical spine shows changes of wear-and-tear between the 6 and 7 vertebrae where there is moderate narrowing of the bony canal on the left side through which the nerve passes but there is no significant compression

## 2023-02-13 ENCOUNTER — Encounter: Payer: Self-pay | Admitting: Neurology

## 2023-02-19 ENCOUNTER — Ambulatory Visit: Payer: Medicaid Other | Admitting: Adult Health

## 2023-02-25 ENCOUNTER — Other Ambulatory Visit (HOSPITAL_BASED_OUTPATIENT_CLINIC_OR_DEPARTMENT_OTHER): Payer: Self-pay

## 2023-02-25 ENCOUNTER — Telehealth: Payer: Self-pay

## 2023-02-25 ENCOUNTER — Encounter (HOSPITAL_BASED_OUTPATIENT_CLINIC_OR_DEPARTMENT_OTHER): Payer: Self-pay | Admitting: Cardiology

## 2023-02-25 ENCOUNTER — Other Ambulatory Visit (HOSPITAL_COMMUNITY): Payer: Self-pay

## 2023-02-25 ENCOUNTER — Ambulatory Visit (INDEPENDENT_AMBULATORY_CARE_PROVIDER_SITE_OTHER): Payer: Medicaid Other | Admitting: Cardiology

## 2023-02-25 ENCOUNTER — Telehealth (HOSPITAL_BASED_OUTPATIENT_CLINIC_OR_DEPARTMENT_OTHER): Payer: Self-pay

## 2023-02-25 VITALS — BP 110/74 | HR 65 | Ht 66.0 in | Wt 244.0 lb

## 2023-02-25 DIAGNOSIS — Q239 Congenital malformation of aortic and mitral valves, unspecified: Secondary | ICD-10-CM

## 2023-02-25 DIAGNOSIS — Z8679 Personal history of other diseases of the circulatory system: Secondary | ICD-10-CM | POA: Diagnosis not present

## 2023-02-25 DIAGNOSIS — I639 Cerebral infarction, unspecified: Secondary | ICD-10-CM | POA: Diagnosis not present

## 2023-02-25 DIAGNOSIS — Z7189 Other specified counseling: Secondary | ICD-10-CM | POA: Diagnosis not present

## 2023-02-25 DIAGNOSIS — Z8249 Family history of ischemic heart disease and other diseases of the circulatory system: Secondary | ICD-10-CM | POA: Diagnosis not present

## 2023-02-25 DIAGNOSIS — Z712 Person consulting for explanation of examination or test findings: Secondary | ICD-10-CM

## 2023-02-25 MED ORDER — SEMAGLUTIDE-WEIGHT MANAGEMENT 1.7 MG/0.75ML ~~LOC~~ SOAJ
1.7000 mg | SUBCUTANEOUS | 1 refills | Status: DC
Start: 2023-05-23 — End: 2023-05-01
  Filled 2023-02-25: qty 3, fill #0

## 2023-02-25 MED ORDER — SEMAGLUTIDE-WEIGHT MANAGEMENT 0.5 MG/0.5ML ~~LOC~~ SOAJ
0.5000 mg | SUBCUTANEOUS | 1 refills | Status: DC
Start: 2023-03-26 — End: 2023-05-01
  Filled 2023-02-25: qty 2, 28d supply, fill #0

## 2023-02-25 MED ORDER — WEGOVY 0.25 MG/0.5ML ~~LOC~~ SOAJ: 0.2500 mg | SUBCUTANEOUS | Status: DC

## 2023-02-25 MED ORDER — SEMAGLUTIDE-WEIGHT MANAGEMENT 1 MG/0.5ML ~~LOC~~ SOAJ
1.0000 mg | SUBCUTANEOUS | 1 refills | Status: DC
Start: 2023-04-24 — End: 2023-05-01
  Filled 2023-02-25: qty 2, fill #0

## 2023-02-25 MED ORDER — SEMAGLUTIDE-WEIGHT MANAGEMENT 2.4 MG/0.75ML ~~LOC~~ SOAJ
2.4000 mg | SUBCUTANEOUS | 11 refills | Status: DC
Start: 2023-06-21 — End: 2023-05-01
  Filled 2023-02-25: qty 3, fill #0

## 2023-02-25 NOTE — Telephone Encounter (Signed)
Pharmacy Patient Advocate Encounter   Received notification from Physician's Office that prior authorization for [facility] is required/requested.   Insurance verification completed.   The patient is insured through [facility] [place] .   Per test claim: PA required; PA submitted to [facility] Medicaid via CoverMyMeds Key/confirmation #/EOC [id number] Status is pending

## 2023-02-25 NOTE — Progress Notes (Addendum)
Cardiology Office Note:  .    Date:  02/25/2023  ID:  MIKILA Massey, DOB 1976-05-11, MRN [id number] PCP: [name], DO  [facility] Providers Cardiologist:  Jodelle Red, MD     History of Present Illness: .    Jaime Massey is a 47 y.o. female with a hx of CVA treated with TNK 06/2022, who is seen for follow-up today. She was initially seen 11/25/2022 as a new consult at the request of [name], DO for the evaluation and management of history of CVA, request for TEE.   Notes reviewed. Seen by Dr. Pearlean Brownie on 11/07/22 for CVA history, recommended TEE for further evaluation.   History: -CVA 06/2022, [facility]: facial droop, numbness, tongue deviation. Treated with TNK with resolution of symptoms. Workup including CT angiogram, MRI, echo, A1c, cholesterol, event monitor, and transcranial dopplers was unremarkable.  -persistent left hand/left foot paresthesias   Cardiovascular risk factors: Prior clinical ASCVD: CVA as above Comorbid conditions: Denies hypertension, hyperlipidemia, diabetes, chronic kidney disease  Metabolic syndrome/Obesity: highest adult weight is current weight. Chronic inflammatory conditions: none that she knows of Tobacco use history: former, quit 2019 Family history: grandfather had first MI age 22, had several more, passed away late 50s/early 66s. Father has an aortic aneurysm, has been monitored for the last 14 years without growing. No other known heart disease. Great aunt had a stroke. Prior pertinent testing and/or incidental findings: see summary above Exercise level:gets shortness of breath with significant activity, but not limited. Has significant stress in her life currently. Current diet: breakfast and lunch can be variable, eats biscuit/sandwich. Past six weeks has been especially difficult as her father has been in/out of hospital and SNF.   At her initial visit, recommend Wegovy to decrease future CVD risk and planned to  discuss at follow up. Proceeded with TEE 12/03/2022 which revealed "tiny degenerative strands on the atrial surface of the mitral valve. 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 This likely has a lateral attachment point, P1 scallop. The mitral valve is otherwise grossly normal. Mild mitral valve regurgitation. No evidence of mitral stenosis". LVEF 50%. Trivial aortic regurgitation. Agitated saline contrast bubble study was negative, with no evidence of any interatrial shunt.   Today, she states she has been feeling the same since her last visit. We reviewed her TEE results at length. She denies any issues that occurred during the test, although her voice was hoarse for a few days afterwards.  Additionally she complains of frequent muscle cramping of her hands and feet. She wonders if her potassium is too low.  She continues to experience paresthesias. Had recent MRI of cervical spine, reportedly no significant compression.  Saw Dr. Lucia Gaskins last month. Previously intolerant of Topamax due to moodiness. Now on Ajovy which seems to be reducing the frequency of her headaches.  She denies any palpitations, chest pain, shortness of breath, peripheral edema, lightheadedness, syncope, orthopnea, or PND.  ROS:  Please see the history of present illness. ROS otherwise negative except as noted.  (+) Muscle cramping of bilateral hands and feet (+) Paresthesias  Studies Reviewed: .         Echo TEE   12/03/2022:  1. There are tiny degenerative strands on the atrial surface of the  mitral valve. 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 This likely has a lateral   attachment point, P1 scallop. The mitral valve is otherwise grossly  normal. Mild mitral valve regurgitation. No evidence of mitral  stenosis.   2. Left ventricular ejection fraction, by estimation, is 50%. The left  ventricle has low normal function.   3. Right ventricular systolic function is normal. The right ventricular  size is normal.   4. No left atrial/left atrial appendage thrombus was detected. The LAA  emptying velocity was 99 cm/s.   5. The aortic valve is tricuspid. Aortic valve regurgitation is trivial.  No aortic stenosis is present.   6. Agitated saline contrast bubble study was negative, with no evidence  of any interatrial shunt.   Physical Exam:    VS:  BP 110/74 (BP Location: Right Arm, Patient Position: Sitting, Cuff Size: Large)   Pulse 65   Ht 5\' 6"  (1.676 m)   Wt 244 lb (110.7 kg)   SpO2 97%   BMI 39.38 kg/m    Wt Readings from Last 3 Encounters:  02/25/23 244 lb (110.7 kg)  12/03/22 230 lb (104.3 kg)  11/25/22 236 lb 3.2 oz (107.1 kg)    GEN: Well nourished, well developed in no acute distress HEENT: Normal, moist mucous membranes NECK: No JVD CARDIAC: regular rhythm, normal S1 and S2, no rubs or gallops. No murmur. VASCULAR: Radial and DP pulses 2+ bilaterally. No carotid bruits RESPIRATORY:  Clear to auscultation without rales, wheezing or rhonchi  ABDOMEN: Soft, non-tender, non-distended MUSCULOSKELETAL:  Ambulates independently SKIN: Warm and dry, no edema NEUROLOGIC:  Alert and oriented x 3. No focal neuro deficits noted. PSYCHIATRIC:  Normal affect   ASSESSMENT AND PLAN: .    CVA -06/2022, treated with TNK -negative workup to date -Reviewed TEE today. There are small fibrinous areas on the mitral valve. There are not large masses concerning for embolic risk, but cannot exclude PFE. No PFO or shunt seen. Given recurrent CVA, would recheck TEE in a year to make sure these have not grown in size.   ASCVD history CVA -recommend Wegovy to decrease future CVD risk given prior ASCVD.   -Patient has a BMI of over 30 or over 27 with a weight related comorbidity -Current BMI  39 -Starting weight 244 -Weight related comorbidities: CVA -Has attempted weight loss with diet/exercise changes. Has not been able to lose >5% of weight on their own -Patient is medically appropriate for GLP treatment for weight loss -Patient will continue lifestyle modification including structured nutrition and physical activity -discussed potential side effects, including GI discomfort (nausea, constipation, diarrhea). Discussed data re: pancreatitis. No known history of multiple endocrine neoplasia.  Semaglutide starting dose plan: -0.25 mg dose every week for 4 weeks (sample), -Then use the 0.5 mg dose every week for 4 weeks, -Then use the 1 mg dose every week for 4 weeks, -Then use the 1.7 mg dose every week for 4 weeks, -Then use the 2.4 mg dose every week indefinitely  Some nausea is normal, as is decreased appetite. Instructed to call with severe nausea or stomach pain. Instructed on small meals.  Teaching done with demo pen today and samples given today.    Semaglutide starting dose plan: -Start with the 0.25 mg dose every week for 4 weeks, -Then use the 0.5 mg dose every week for 4 weeks, -Then continue with 1 mg dose every week. We will meet when you are on the 1 mg dose to discuss timing of uptitration.  Some nausea is normal, as is decreased appetite. If you have severe nausea or stomach pain, please contact us and stop the medication.   Teaching done with demo pen today and samples given today.   Addended to add: patient has been working to increase physical activity and make long term nutrition changes. She will continue to do this while on GLP1RA as well.   Cardiac risk counseling and prevention recommendations: -recommend heart healthy/Mediterranean diet, with whole grains, fruits, vegetable, fish, lean meats, nuts, and olive oil. Limit salt. -recommend moderate walking, 3-5 times/week for 30-50 minutes each session. Aim for at least 150 minutes.week. Goal should be  pace of 3 miles/hours, or walking 1.5 miles in 30 minutes -recommend avoidance of tobacco products. Avoid excess alcohol.  Dispo: Follow-up in 3 months, or sooner as needed.  I,Mathew Stumpf,acting as a Neurosurgeon for [employer], MD.,have documented all relevant documentation on the behalf of Jodelle Red, MD,as directed by  Jodelle Red, MD while in the presence of Jodelle Red, MD.  I, Jodelle Red, MD, have reviewed all documentation for this visit. The documentation on 02/25/23 for the exam, diagnosis, procedures, and orders are all accurate and complete.   Signed, Jodelle Red, MD

## 2023-02-25 NOTE — Telephone Encounter (Signed)
Please advise how you would like to proceed 

## 2023-02-25 NOTE — Telephone Encounter (Signed)
Patient started on wegovy today, will likely need prior auth. Patient had medicaid can used ASCVD and BMI for prior auth

## 2023-02-25 NOTE — Telephone Encounter (Signed)
PA request has been Submitted. New Encounter created for follow up. For additional info see Pharmacy Prior Auth telephone encounter from 02/25/23.

## 2023-02-25 NOTE — Telephone Encounter (Signed)
Pharmacy Patient Advocate Encounter  Received notification from [facility] Medicaid that Prior Authorization for [facility] has been DENIED. Please advise how you'd like to proceed. Full denial letter will be uploaded to the media tab. See denial reason below.

## 2023-02-25 NOTE — Patient Instructions (Addendum)
Medication Instructions:  START WEGOVY  INJECT 0.25 MG WEEKLY FOR 4 WEEKS  INCREASE TO 0.5 MG WEEKLY FOR 4 WEEKS,  INCREASE TO 1 MG WEEKLY FOR 4 WEEKS INCREASE TO 1.7 MG WEEKLY FOR 4 WEEKS INCREASE TO 2.4 MG WEEKLY FOR 4 WEEKS   *If you need a refill on your cardiac medications before your next appointment, please call your pharmacy*  Lab Work: NONE  Testing/Procedures: NONE  Follow-Up: At [facility], you and your health needs are our priority.  As part of our continuing mission to provide you with exceptional heart care, we have created designated Provider Care Teams.  These Care Teams include your primary Cardiologist (physician) and Advanced Practice Providers (APPs -  Physician Assistants and Nurse Practitioners) who all work together to provide you with the care you need, when you need it.  We recommend signing up for the patient portal called "MyChart".  Sign up information is provided on this After Visit Summary.  MyChart is used to connect with patients for Virtual Visits (Telemedicine).  Patients are able to view lab/test results, encounter notes, upcoming appointments, etc.  Non-urgent messages can be sent to your provider as well.   To learn more about what you can do with MyChart, go to [email or web address].    Your next appointment:   3 month(s)  The format for your next appointment:   In Person  Provider:   Jodelle Red, MD or Ronn Melena NP

## 2023-02-27 NOTE — Telephone Encounter (Signed)
Key for reference [id number]

## 2023-02-27 NOTE — Telephone Encounter (Signed)
Request was submitted with indications provided.

## 2023-03-03 ENCOUNTER — Other Ambulatory Visit (HOSPITAL_COMMUNITY): Payer: Self-pay

## 2023-03-03 NOTE — Telephone Encounter (Signed)
Reached out to nursing and provider to get appeal information. Waiting on updated chart notes.

## 2023-03-03 NOTE — Telephone Encounter (Signed)
Patient states medication is still being denied. Please advise

## 2023-03-04 ENCOUNTER — Other Ambulatory Visit (HOSPITAL_BASED_OUTPATIENT_CLINIC_OR_DEPARTMENT_OTHER): Payer: Self-pay

## 2023-03-05 ENCOUNTER — Other Ambulatory Visit (HOSPITAL_BASED_OUTPATIENT_CLINIC_OR_DEPARTMENT_OTHER): Payer: Self-pay

## 2023-03-06 ENCOUNTER — Other Ambulatory Visit: Payer: Self-pay | Admitting: Orthopedic Surgery

## 2023-03-07 ENCOUNTER — Other Ambulatory Visit (HOSPITAL_BASED_OUTPATIENT_CLINIC_OR_DEPARTMENT_OTHER): Payer: Self-pay

## 2023-03-24 ENCOUNTER — Ambulatory Visit (INDEPENDENT_AMBULATORY_CARE_PROVIDER_SITE_OTHER): Payer: Medicaid Other | Admitting: Neurology

## 2023-03-24 ENCOUNTER — Ambulatory Visit (INDEPENDENT_AMBULATORY_CARE_PROVIDER_SITE_OTHER): Payer: Self-pay | Admitting: Neurology

## 2023-03-24 DIAGNOSIS — R94131 Abnormal electromyogram [EMG]: Secondary | ICD-10-CM

## 2023-03-24 DIAGNOSIS — R202 Paresthesia of skin: Secondary | ICD-10-CM

## 2023-03-24 DIAGNOSIS — M545 Low back pain, unspecified: Secondary | ICD-10-CM

## 2023-03-24 DIAGNOSIS — Z0289 Encounter for other administrative examinations: Secondary | ICD-10-CM

## 2023-03-24 NOTE — Progress Notes (Unsigned)
  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   Summary: EMG/NCS performed on the left upper and lower extremities. EMG needle study shows spontaneous activity in the lower lumbar paraspinals. All nerves and remaining muscles (as indicated in the following tables) were within normal limits.     Conclusion: Spontaneous activity in the lower lumbar paraspinals may be due to acute/ongoing radiculopathy but cannot localize further due to overlap in innervation at this location recommend MRI lumbar spine.   Orders Placed This Encounter  Procedures   MR LUMBAR SPINE WO CONTRAST

## 2023-03-31 NOTE — Procedures (Signed)
Full Name: Jaime Massey Gender: Female MRN #: [id number] Date of Birth: October 09, 1975    Visit Date: 03/24/2023 13:26 Age: 47 Years Examining Physician: Dr. Naomie Dean Referring Physician: Dr. Naomie Dean Height: 5 feet 9 inch  History: Patient left arm and left leg numbness. Thorough workup x 2 (happened in 2021 right side) negative.  went to PT, no findings to explain symptoms on mri c-spine and brain will order an L-Spine.   Summary: EMG/NCS performed on the left upper and lower extremities. EMG needle study shows spontaneous activity in the lower lumbar paraspinals. All nerves and remaining muscles (as indicated in the following tables) were within normal limits.     Conclusion: Spontaneous activity in the lower lumbar paraspinals may be due to acute/ongoing radiculopathy but cannot localize further due to overlap in innervation at this location recommend MRI lumbar spine.   Orders Placed This Encounter  Procedures   MR LUMBAR SPINE WO CONTRAST       ------------------------------- Naomie Dean, M.D.  [facility] [address] [place], [place] [address] Tel: [phone] Fax: [phone]  Verbal informed consent was obtained from the patient, patient was informed of potential risk of procedure, including bruising, bleeding, hematoma formation, infection, muscle weakness, muscle pain, numbness, among others.        MNC    Nerve / Sites Muscle Latency Ref. Amplitude Ref. Rel Amp Segments Distance Velocity Ref. Area    ms ms mV mV %  cm m/s m/s mVms  L Median - APB     Wrist APB 3.0 <=4.4 8.2 >=4.0 100 Wrist - APB 7   36.4     Upper arm APB 7.9  6.2  74.8 Upper arm - Wrist 26.6 54 >=49 25.7  L Ulnar - ADM     Wrist ADM 2.5 <=3.3 8.2 >=6.0 100 Wrist - ADM 7   30.2     B.Elbow ADM 4.4  8.5  104 B.Elbow - Wrist 11 60 >=49 29.5     A.Elbow ADM 8.0  8.7  102 A.Elbow - B.Elbow 20 55 >=49 31.4  L Peroneal - EDB     Ankle EDB 4.4 <=6.5 4.0  >=2.0 100 Ankle - EDB 9   13.3     Fib head EDB 11.1  3.2  80.1 Fib head - Ankle 30 45 >=44 11.0     Pop fossa EDB 13.0  3.7  114 Pop fossa - Fib head 10 53 >=44 12.1         Pop fossa - Ankle      L Tibial - AH     Ankle AH 4.7 <=5.8 8.9 >=4.0 100 Ankle - AH 9   17.4     Pop fossa AH 12.8  5.6  63.4 Pop fossa - Ankle 37 46 >=41 17.3             SNC    Nerve / Sites Rec. Site Peak Lat Ref.  Amp Ref. Segments Distance Peak Diff Ref.    ms ms V V  cm ms ms  L Sural - Ankle (Calf)     Calf Ankle 3.9 <=4.4 9 >=6 Calf - Ankle 14    L Superficial peroneal - Ankle     Lat leg Ankle 3.8 <=4.4 6 >=6 Lat leg - Ankle 14    L Median, Ulnar - Transcarpal comparison     Median Palm Wrist 2.2 <=2.2 43 >=35 Median Palm - Wrist 8  Ulnar Palm Wrist 1.9 <=2.2 14 >=12 Ulnar Palm - Wrist 8          Median Palm - Ulnar Palm  0.3 <=0.4  L Median - Orthodromic (Dig II, Mid palm)     Dig II Wrist 3.4 <=3.4 13 >=10 Dig II - Wrist 13    L Ulnar - Orthodromic, (Dig V, Mid palm)     Dig V Wrist 2.9 <=3.1 6 >=5 Dig V - Wrist 57                 F  Wave    Nerve F Lat Ref.   ms ms  L Tibial - AH 46.3 <=56.0  L Ulnar - ADM 27.5 <=32.0         EMG Summary Table    Spontaneous MUAP Recruitment  Muscle IA Fib PSW Fasc Other Amp Dur. Poly Pattern  L. Deltoid Normal None None None _______ Normal Normal Normal Normal  L. Triceps brachii Normal None None None _______ Normal Normal Normal Normal  L. Pronator teres Normal None None None _______ Normal Normal Normal Normal  L. Opponens pollicis Normal None None None _______ Normal Normal Normal Normal  L. First dorsal interosseous Normal None None None _______ Normal Normal Normal Normal  L. Cervical paraspinals (low) Normal None None None _______ Normal Normal Normal Normal  L. Biceps femoris (long head) Normal None None None _______ Normal Normal Normal Normal  L. Lumbar paraspinals (low) Normal +1 +1 None _______ Normal Normal Normal Normal  L. Vastus  medialis Normal None None None _______ Normal Normal Normal Normal  L. Tibialis anterior Normal None None None _______ Normal Normal Normal Normal  L. Gastrocnemius (Medial head) Normal None None None _______ Normal Normal Normal Normal  L. Gluteus maximus Normal None None None _______ Normal Normal Normal Normal  L. Gluteus medius Normal None None None _______ Normal Normal Normal Normal  L. Extensor hallucis longus Normal None None None _______ Normal Normal Normal Normal

## 2023-04-01 NOTE — Telephone Encounter (Signed)
Appeal has been sent. Status is pending.

## 2023-04-02 ENCOUNTER — Telehealth: Payer: Self-pay | Admitting: Neurology

## 2023-04-02 NOTE — Telephone Encounter (Signed)
[employer] Berkley Harvey: [id number] exp. 03/31/23-05/30/23 sent to GI [phone]

## 2023-04-03 ENCOUNTER — Other Ambulatory Visit (HOSPITAL_COMMUNITY): Payer: Self-pay

## 2023-04-04 ENCOUNTER — Other Ambulatory Visit: Payer: Self-pay | Admitting: Orthopedic Surgery

## 2023-04-08 ENCOUNTER — Encounter (HOSPITAL_BASED_OUTPATIENT_CLINIC_OR_DEPARTMENT_OTHER): Payer: Self-pay

## 2023-04-08 NOTE — Telephone Encounter (Signed)
Patient sent duplicate encounters, will answer in first encounter.

## 2023-04-09 ENCOUNTER — Encounter: Payer: Self-pay | Admitting: Neurology

## 2023-04-14 ENCOUNTER — Other Ambulatory Visit (HOSPITAL_COMMUNITY): Payer: Self-pay

## 2023-04-14 NOTE — Telephone Encounter (Signed)
Pharmacy Patient Advocate Encounter  Received notification from [facility] Medicaid that Prior Authorization for wegovy has been APPROVED from 02/25/23 to 10/13/23. Ran test claim, Copay is $4.00. This test claim was processed through [facility]- copay amounts may vary at other pharmacies due to pharmacy/plan contracts, or as the patient moves through the different stages of their insurance plan.   PA #/Case ID/Reference #: [id number]

## 2023-04-15 ENCOUNTER — Ambulatory Visit
Admission: RE | Admit: 2023-04-15 | Discharge: 2023-04-15 | Disposition: A | Discharge status: Home / Self Care | Payer: Medicaid Other | Source: Ambulatory Visit | Attending: Neurology

## 2023-04-15 DIAGNOSIS — R202 Paresthesia of skin: Secondary | ICD-10-CM

## 2023-04-15 DIAGNOSIS — R94131 Abnormal electromyogram [EMG]: Secondary | ICD-10-CM

## 2023-04-15 DIAGNOSIS — M79605 Pain in left leg: Secondary | ICD-10-CM

## 2023-04-15 DIAGNOSIS — M545 Low back pain, unspecified: Secondary | ICD-10-CM | POA: Diagnosis not present

## 2023-04-21 ENCOUNTER — Telehealth: Payer: Self-pay | Admitting: *Deleted

## 2023-04-21 ENCOUNTER — Other Ambulatory Visit: Payer: Self-pay | Admitting: Neurology

## 2023-04-21 DIAGNOSIS — M5417 Radiculopathy, lumbosacral region: Secondary | ICD-10-CM

## 2023-04-21 NOTE — Telephone Encounter (Signed)
-----   Message from Anson Fret sent at 04/20/2023  4:55 PM EDT ----- Pod 4 please call and inquire and let me know thank you: Jaime Massey, looks like your left L5 nerve root may be pinched. That would be consistent with the EMG findings of the same. Would you like a referral to try an injection or see an orthopaedist or neurosurgeon? I'll have my team call you thanks

## 2023-04-21 NOTE — Telephone Encounter (Signed)
Ordered, thanks.

## 2023-04-21 NOTE — Telephone Encounter (Signed)
Spoke to pt gave mri lumbar results Pt states wants to move forward with neurosurgeon . Pt thanked me for calling  forward to Dr Lucia Gaskins to place order for referral

## 2023-04-23 ENCOUNTER — Telehealth: Payer: Self-pay | Admitting: Neurology

## 2023-04-23 NOTE — Telephone Encounter (Signed)
Referral for neurosurgery fax to [facility]. Phone: [phone], Fax: [phone]

## 2023-04-28 ENCOUNTER — Encounter: Payer: Self-pay | Admitting: Orthopedic Surgery

## 2023-04-28 ENCOUNTER — Ambulatory Visit (INDEPENDENT_AMBULATORY_CARE_PROVIDER_SITE_OTHER): Payer: Medicaid Other | Admitting: Orthopedic Surgery

## 2023-04-28 DIAGNOSIS — M2241 Chondromalacia patellae, right knee: Secondary | ICD-10-CM

## 2023-04-28 DIAGNOSIS — M1711 Unilateral primary osteoarthritis, right knee: Secondary | ICD-10-CM

## 2023-04-28 DIAGNOSIS — Z9889 Other specified postprocedural states: Secondary | ICD-10-CM

## 2023-04-28 DIAGNOSIS — M170 Bilateral primary osteoarthritis of knee: Secondary | ICD-10-CM

## 2023-04-28 DIAGNOSIS — G8929 Other chronic pain: Secondary | ICD-10-CM

## 2023-04-28 DIAGNOSIS — M1710 Unilateral primary osteoarthritis, unspecified knee: Secondary | ICD-10-CM

## 2023-04-28 MED ORDER — METHYLPREDNISOLONE ACETATE 40 MG/ML IJ SUSP
40.0000 mg | Freq: Once | INTRAMUSCULAR | Status: AC
Start: 2023-04-28 — End: 2023-04-28
  Administered 2023-04-28: 40 mg via INTRA_ARTICULAR

## 2023-04-28 NOTE — Progress Notes (Signed)
Injections  Jaime Massey December requested injections both knees for chronic right and left knee pain secondary to osteoarthritis  Encounter Diagnoses  Name Primary?   Chronic pain of right knee Yes   Chronic pain of left knee    Primary osteoarthritis of right knee    Chondromalacia patellae of right knee    S/P right knee arthroscopy 2018    Primary localized osteoarthritis of knee-LEFT     Procedure note for bilateral knee injections  Procedure note left knee injection verbal consent was obtained to inject left knee joint  Timeout was completed to confirm the site of injection  The medications used were 40 mg depomedrol and 3 cc of 1% lidocaine  Anesthesia was provided by ethyl chloride and the skin was prepped with alcohol.  After cleaning the skin with alcohol a 20-gauge needle was used to inject the left knee joint. There were no complications. A sterile bandage was applied.   Procedure note right knee injection verbal consent was obtained to inject right knee joint  Timeout was completed to confirm the site of injection  The medications used were 40 mg depomedrol and 3 cc of 1% lidocaine  Anesthesia was provided by ethyl chloride and the skin was prepped with alcohol.  After cleaning the skin with alcohol a 20-gauge needle was used to inject the right knee joint. There were no complications. A sterile bandage was applied.

## 2023-04-28 NOTE — Patient Instructions (Signed)
Jaime Massey is the office you can ask to be referred to Office address is [address] [place] The phone number is [phone]  The office will call you with an appointment Dr. Alvester Morin does the injections Dr Christell Constant is the surgeon

## 2023-05-01 ENCOUNTER — Other Ambulatory Visit: Payer: Self-pay

## 2023-05-01 ENCOUNTER — Other Ambulatory Visit (HOSPITAL_BASED_OUTPATIENT_CLINIC_OR_DEPARTMENT_OTHER): Payer: Self-pay

## 2023-05-01 DIAGNOSIS — Z8679 Personal history of other diseases of the circulatory system: Secondary | ICD-10-CM

## 2023-05-01 DIAGNOSIS — I639 Cerebral infarction, unspecified: Secondary | ICD-10-CM

## 2023-05-01 MED ORDER — WEGOVY 0.25 MG/0.5ML ~~LOC~~ SOAJ
0.2500 mg | SUBCUTANEOUS | 0 refills | Status: DC
  Filled 2023-05-01: qty 2, 28d supply, fill #0

## 2023-05-01 MED ORDER — SEMAGLUTIDE-WEIGHT MANAGEMENT 2.4 MG/0.75ML ~~LOC~~ SOAJ
2.4000 mg | SUBCUTANEOUS | 11 refills | Status: DC
  Filled 2023-05-01: qty 3, fill #0

## 2023-05-01 MED ORDER — SEMAGLUTIDE-WEIGHT MANAGEMENT 1.7 MG/0.75ML ~~LOC~~ SOAJ
1.7000 mg | SUBCUTANEOUS | 1 refills | Status: AC
  Filled 2023-05-01: qty 3, fill #0

## 2023-05-01 MED ORDER — SEMAGLUTIDE-WEIGHT MANAGEMENT 1 MG/0.5ML ~~LOC~~ SOAJ
1.0000 mg | SUBCUTANEOUS | 1 refills | Status: AC
  Filled 2023-05-01: qty 2, fill #0
  Filled 2023-07-30: qty 2, 28d supply, fill #0
  Filled 2023-09-10: qty 2, 28d supply, fill #1

## 2023-05-01 MED ORDER — SEMAGLUTIDE-WEIGHT MANAGEMENT 0.5 MG/0.5ML ~~LOC~~ SOAJ
0.5000 mg | SUBCUTANEOUS | 1 refills | Status: DC
  Filled 2023-05-01: qty 2, fill #0
  Filled 2023-05-29 (×2): qty 2, 28d supply, fill #0

## 2023-05-03 ENCOUNTER — Other Ambulatory Visit: Payer: Self-pay | Admitting: Orthopedic Surgery

## 2023-05-14 NOTE — Telephone Encounter (Signed)
Received office visit note from patient's visit at [facility] neurosurgery on 05/07/23.  Notes have been placed in Dr. Trevor Mace office for review.

## 2023-05-23 ENCOUNTER — Ambulatory Visit (HOSPITAL_BASED_OUTPATIENT_CLINIC_OR_DEPARTMENT_OTHER): Payer: Medicaid Other | Admitting: Cardiology

## 2023-05-29 ENCOUNTER — Other Ambulatory Visit (HOSPITAL_BASED_OUTPATIENT_CLINIC_OR_DEPARTMENT_OTHER): Payer: Self-pay | Admitting: Cardiology

## 2023-05-29 ENCOUNTER — Other Ambulatory Visit (HOSPITAL_BASED_OUTPATIENT_CLINIC_OR_DEPARTMENT_OTHER): Payer: Self-pay

## 2023-05-29 ENCOUNTER — Other Ambulatory Visit: Payer: Self-pay

## 2023-05-29 DIAGNOSIS — I639 Cerebral infarction, unspecified: Secondary | ICD-10-CM

## 2023-05-29 MED ORDER — WEGOVY 0.5 MG/0.5ML ~~LOC~~ SOAJ
0.5000 mg | SUBCUTANEOUS | 0 refills | Status: DC
  Filled 2023-05-29: qty 2, fill #0
  Filled 2023-06-25 – 2023-06-30 (×2): qty 2, 28d supply, fill #0

## 2023-05-29 NOTE — Telephone Encounter (Signed)
Wegovy refill/renewal.

## 2023-06-01 ENCOUNTER — Other Ambulatory Visit: Payer: Self-pay | Admitting: Orthopedic Surgery

## 2023-06-03 ENCOUNTER — Other Ambulatory Visit (HOSPITAL_BASED_OUTPATIENT_CLINIC_OR_DEPARTMENT_OTHER): Payer: Self-pay

## 2023-06-08 ENCOUNTER — Other Ambulatory Visit: Payer: Self-pay | Admitting: Orthopedic Surgery

## 2023-06-18 ENCOUNTER — Other Ambulatory Visit: Payer: Self-pay | Admitting: Orthopedic Surgery

## 2023-06-19 MED ORDER — MELOXICAM 7.5 MG PO TABS: 7.5000 mg | ORAL_TABLET | Freq: Every day | ORAL | 5 refills | Status: DC

## 2023-06-25 ENCOUNTER — Other Ambulatory Visit (HOSPITAL_BASED_OUTPATIENT_CLINIC_OR_DEPARTMENT_OTHER): Payer: Self-pay

## 2023-06-30 ENCOUNTER — Other Ambulatory Visit (INDEPENDENT_AMBULATORY_CARE_PROVIDER_SITE_OTHER): Payer: Self-pay

## 2023-06-30 ENCOUNTER — Other Ambulatory Visit: Payer: Self-pay

## 2023-06-30 ENCOUNTER — Other Ambulatory Visit (HOSPITAL_BASED_OUTPATIENT_CLINIC_OR_DEPARTMENT_OTHER): Payer: Self-pay

## 2023-06-30 DIAGNOSIS — Z0289 Encounter for other administrative examinations: Secondary | ICD-10-CM

## 2023-07-01 ENCOUNTER — Other Ambulatory Visit (HOSPITAL_BASED_OUTPATIENT_CLINIC_OR_DEPARTMENT_OTHER): Payer: Self-pay

## 2023-07-25 ENCOUNTER — Telehealth: Payer: Self-pay | Admitting: Orthopedic Surgery

## 2023-07-25 NOTE — Telephone Encounter (Signed)
Tried to return the patient's call to rs her appt, lvm for her to call back.

## 2023-07-30 ENCOUNTER — Other Ambulatory Visit (HOSPITAL_BASED_OUTPATIENT_CLINIC_OR_DEPARTMENT_OTHER): Payer: Self-pay

## 2023-08-01 ENCOUNTER — Ambulatory Visit: Payer: Medicaid Other | Admitting: Orthopedic Surgery

## 2023-08-01 DIAGNOSIS — M25559 Pain in unspecified hip: Secondary | ICD-10-CM | POA: Insufficient documentation

## 2023-08-04 ENCOUNTER — Encounter: Payer: Self-pay | Admitting: Orthopedic Surgery

## 2023-08-04 ENCOUNTER — Other Ambulatory Visit: Payer: Self-pay | Admitting: Orthopedic Surgery

## 2023-08-04 ENCOUNTER — Telehealth: Payer: Self-pay | Admitting: Orthopedic Surgery

## 2023-08-04 ENCOUNTER — Ambulatory Visit (INDEPENDENT_AMBULATORY_CARE_PROVIDER_SITE_OTHER): Payer: Medicaid Other | Admitting: Orthopedic Surgery

## 2023-08-04 DIAGNOSIS — G8929 Other chronic pain: Secondary | ICD-10-CM | POA: Diagnosis not present

## 2023-08-04 DIAGNOSIS — M25562 Pain in left knee: Secondary | ICD-10-CM

## 2023-08-04 DIAGNOSIS — M25561 Pain in right knee: Secondary | ICD-10-CM | POA: Diagnosis not present

## 2023-08-04 MED ORDER — MELOXICAM 7.5 MG PO TABS: 7.5000 mg | ORAL_TABLET | Freq: Every day | ORAL | 5 refills | Status: DC

## 2023-08-04 MED ORDER — METHYLPREDNISOLONE ACETATE 40 MG/ML IJ SUSP
40.0000 mg | Freq: Once | INTRAMUSCULAR | Status: AC
Start: 2023-08-04 — End: 2023-08-04
  Administered 2023-08-04: 40 mg via INTRA_ARTICULAR

## 2023-08-04 NOTE — Telephone Encounter (Signed)
Dr. Mort Sawyers pt - pt lvm stating she was here today for an appointment and she meant to ask him about a refill for Meloxicam 7.5, 60 tablets, Take 1 tablet (7.5 mg total) by mouth daily.  She stated it should be twice a day and that the last two refills she got was for once a day and she's running out.  She stated Dr. Romeo Apple may need to redo the script.  [facility] in [place].

## 2023-08-04 NOTE — Telephone Encounter (Signed)
DR HARRISON   Dr. Windy Canny wants to know when she needs to come back for follow up for her right knee or just keep doing the injections   Please call her back at [phone]

## 2023-08-04 NOTE — Progress Notes (Signed)
Encounter Diagnoses  Name Primary?   Chronic pain of right knee Yes   Chronic pain of left knee     Chief Complaint  Patient presents with   Injections    Both knees     Procedure note for bilateral knee injections  Procedure note left knee injection verbal consent was obtained to inject left knee joint  Timeout was completed to confirm the site of injection  The medications used were 40 mg depomedrol and 3 cc of 1% lidocaine  Anesthesia was provided by ethyl chloride and the skin was prepped with alcohol.  After cleaning the skin with alcohol a 20-gauge needle was used to inject the left knee joint. There were no complications. A sterile bandage was applied.   Procedure note right knee injection verbal consent was obtained to inject right knee joint  Timeout was completed to confirm the site of injection  The medications used were 40 mg depomedrol and 3 cc of 1% lidocaine  Anesthesia was provided by ethyl chloride and the skin was prepped with alcohol.  After cleaning the skin with alcohol a 20-gauge needle was used to inject the right knee joint. There were no complications. A sterile bandage was applied.

## 2023-08-05 ENCOUNTER — Encounter: Payer: Self-pay | Admitting: Orthopedic Surgery

## 2023-08-06 MED ORDER — MELOXICAM 7.5 MG PO TABS: 7.5000 mg | ORAL_TABLET | Freq: Two times a day (BID) | ORAL | 5 refills | Status: DC

## 2023-08-06 NOTE — Addendum Note (Signed)
Addended byCaffie Damme on: 08/06/2023 08:58 AM   Modules accepted: Orders

## 2023-09-10 ENCOUNTER — Other Ambulatory Visit (HOSPITAL_BASED_OUTPATIENT_CLINIC_OR_DEPARTMENT_OTHER): Payer: Self-pay

## 2023-09-10 ENCOUNTER — Ambulatory Visit (HOSPITAL_BASED_OUTPATIENT_CLINIC_OR_DEPARTMENT_OTHER): Payer: Medicaid Other | Admitting: Cardiology

## 2023-09-10 VITALS — BP 118/84 | HR 67 | Ht 66.0 in | Wt 241.4 lb

## 2023-09-10 DIAGNOSIS — Z8679 Personal history of other diseases of the circulatory system: Secondary | ICD-10-CM | POA: Diagnosis not present

## 2023-09-10 DIAGNOSIS — Z8673 Personal history of transient ischemic attack (TIA), and cerebral infarction without residual deficits: Secondary | ICD-10-CM | POA: Diagnosis not present

## 2023-09-10 DIAGNOSIS — Z8249 Family history of ischemic heart disease and other diseases of the circulatory system: Secondary | ICD-10-CM | POA: Diagnosis not present

## 2023-09-10 DIAGNOSIS — R0789 Other chest pain: Secondary | ICD-10-CM

## 2023-09-10 DIAGNOSIS — Z7189 Other specified counseling: Secondary | ICD-10-CM

## 2023-09-10 DIAGNOSIS — Q239 Congenital malformation of aortic and mitral valves, unspecified: Secondary | ICD-10-CM

## 2023-09-10 NOTE — Progress Notes (Signed)
 Cardiology Office Note:  .    Date:  09/10/2023  ID:  Jaime Massey, DOB 08-09-1975, MRN [id number] PCP: [name], DO  [facility] Providers Cardiologist:  Jodelle Red, MD     History of Present Illness: .    Jaime Massey is a 48 y.o. female with a hx of CVA treated with TNK 06/2022, who is seen for follow-up today. She was initially seen 11/25/2022 as a new consult at the request of [name], DO for the evaluation and management of history of CVA, request for TEE.   History: -CVA 06/2022, [facility]: facial droop, numbness, tongue deviation. Treated with TNK with resolution of symptoms. Workup including CT angiogram, MRI, echo, A1c, cholesterol, event monitor, and transcranial dopplers was unremarkable.  -persistent left hand/left foot paresthesias -TEE 12/03/2022 which revealed "tiny degenerative strands on the atrial surface of the mitral valve. 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 This likely has a lateral attachment point, P1 scallop. The mitral valve is otherwise grossly normal. Mild mitral valve regurgitation. No evidence of mitral stenosis". LVEF 50%. Trivial aortic regurgitation. Agitated saline contrast bubble study was negative, with no evidence of any interatrial shunt.    Tobacco use history: former, quit 2019 Family history: grandfather had first MI age 62, had several more, passed away late 50s/early 60s. Father has an aortic aneurysm, has been monitored for the last 14 years without growing. No other known heart disease. Great aunt had a stroke.   Today: Now on 1 mg dose of wegovy. Hasn't seen much change in her weight yet. Appetite is starting to decrease. Had some nausea with first dose of this last week. Has some acid reflux at night. Not eating late night snacks. Has significantly decreased her overall food intake. Has noticed  change in how her clothes fit.   Still working on giving up soda. Also discussed importance of activity.   Still dealing with headaches. Working with Dr. Lucia Gaskins.  Having intermittent chest pain, sharp, in the middle of her chest. Usually brief but can last up to 2 days, not severe, happens several times/week. Helped a little with PPI/tums.   Had one episode where the whole left side of her face was numb for about half a day, but no droop or other symptoms.  Denies shortness of breath at rest or with normal exertion. No PND, orthopnea, LE edema or unexpected weight gain. No syncope or palpitations. ROS otherwise negative except as noted.   ROS:  Please see the history of present illness. ROS otherwise negative except as noted.   Studies Reviewed: Marland Kitchen         Physical Exam:    VS:  BP 118/84   Pulse 67   Ht 5\' 6"  (1.676 m)   Wt 241 lb 6.4 oz (109.5 kg)   SpO2 96%   BMI 38.96 kg/m    Wt Readings from Last 3 Encounters:  09/10/23 241 lb 6.4 oz (109.5 kg)  02/25/23 244 lb (110.7 kg)  12/03/22 230 lb (104.3 kg)    GEN: Well nourished, well developed in no acute distress HEENT: Normal, moist mucous membranes NECK: No JVD CARDIAC: regular rhythm, normal S1 and S2, no rubs or gallops. No murmur. VASCULAR: Radial and DP pulses 2+ bilaterally. No carotid bruits RESPIRATORY:  Clear to auscultation without rales, wheezing or rhonchi  ABDOMEN: Soft, non-tender, non-distended MUSCULOSKELETAL:  Ambulates independently  SKIN: Warm and dry, no edema NEUROLOGIC:  Alert and oriented x 3. No focal neuro deficits noted. PSYCHIATRIC:  Normal affect    ASSESSMENT AND PLAN: .    Chest pain, atypical -sharp, nonexertional, helped by PPI/tums, supports GI -reviewed red flag warning signs that need immediate medical attention  CVA -06/2022, treated with TNK -negative workup to date -TEE 11/2022 reviewed, mitral leaflet fibrinous strands noted. We discussed rechecking the size of the fibrinous  strands by TEE; discussed pros/cons of this. We will re-evaluate at next visit.   ASCVD history CVA -now on Wegovy to decrease future CVD risk given prior ASCVD.  -Starting weight 244 lbs, current 241 lbs -currently on wegovy 1 mg weekly, has some nausea post dose and intermittent GERD -continue to monitor symptoms and increase dose as able. She will contact us if she needs to pause on Wegovy due to symptoms   Cardiac risk counseling and prevention recommendations: -recommend heart healthy/Mediterranean diet, with whole grains, fruits, vegetable, fish, lean meats, nuts, and olive oil. Limit salt. -recommend moderate walking, 3-5 times/week for 30-50 minutes each session. Aim for at least 150 minutes.week. Goal should be pace of 3 miles/hours, or walking 1.5 miles in 30 minutes -recommend avoidance of tobacco products. Avoid excess alcohol.  Dispo: Follow-up in 4-5 months, or sooner as needed.  Signed, Jodelle Red, MD

## 2023-09-10 NOTE — Patient Instructions (Signed)
 Medication Instructions:  Your physician recommends that you continue on your current medications as directed. Please refer to the Current Medication list given to you today.   Please contact office [phone] if you need to take a break from [facility] if symptoms occur.   *If you need a refill on your cardiac medications before your next appointment, please call your pharmacy*  Follow-Up: At [facility], you and your health needs are our priority.  As part of our continuing mission to provide you with exceptional heart care, we have created designated Provider Care Teams.  These Care Teams include your primary Cardiologist (physician) and Advanced Practice Providers (APPs -  Physician Assistants and Nurse Practitioners) who all work together to provide you with the care you need, when you need it.  We recommend signing up for the patient portal called "MyChart".  Sign up information is provided on this After Visit Summary.  MyChart is used to connect with patients for Virtual Visits (Telemedicine).  Patients are able to view lab/test results, encounter notes, upcoming appointments, etc.  Non-urgent messages can be sent to your provider as well.   To learn more about what you can do with MyChart, go to [email or web address].    Your next appointment:   4 to 5 month(s)  Provider:   Jodelle Red, MD

## 2023-09-28 ENCOUNTER — Encounter (HOSPITAL_BASED_OUTPATIENT_CLINIC_OR_DEPARTMENT_OTHER): Payer: Self-pay | Admitting: Cardiology

## 2023-10-01 ENCOUNTER — Ambulatory Visit (INDEPENDENT_AMBULATORY_CARE_PROVIDER_SITE_OTHER): Admitting: Podiatry

## 2023-10-01 DIAGNOSIS — B351 Tinea unguium: Secondary | ICD-10-CM | POA: Diagnosis not present

## 2023-10-01 DIAGNOSIS — B49 Unspecified mycosis: Secondary | ICD-10-CM

## 2023-10-01 MED ORDER — TERBINAFINE HCL 250 MG PO TABS: 250.0000 mg | ORAL_TABLET | Freq: Every day | ORAL | 0 refills | Status: DC

## 2023-10-01 NOTE — Progress Notes (Signed)
   Chief Complaint  Patient presents with   Nail Problem    RM#6 Patient states has had ingrown nails removed years ago both big toes and now growing back thicker.    Subjective: 48 y.o. female presenting today for evaluation of pain and tenderness associated to thick discolored toenails ongoing for several years.  She has tried topical antifungal medications with no improvement.  Past Medical History:  Diagnosis Date   Anxiety    Arthritis    Asthma due to environmental allergies    pt states she has never had asthma problems    Depression    DVT (deep venous thrombosis) (HCC)    post c-section   Migraine    PE (pulmonary embolism)    post c-section   Seizures (HCC)    as child from severe ear infections- no meds now and no seizures since age 44   Warfarin anticoagulation    took from 03/2010-12/2010.     Past Surgical History:  Procedure Laterality Date   CESAREAN SECTION     CHOLECYSTECTOMY N/A 12/21/2012   Procedure: LAPAROSCOPIC CHOLECYSTECTOMY;  Surgeon: Fabio Bering, MD;  Location: [facility];  Service: General;  Laterality: N/A;   KNEE ARTHROSCOPY WITH MEDIAL MENISECTOMY Right 03/05/2017   Procedure: chondroplasty patella, medial femoral condyle;  Surgeon: Vickki Hearing, MD;  Location: [facility];  Service: Orthopedics;  Laterality: Right;   TEE WITHOUT CARDIOVERSION N/A 12/03/2022   Procedure: TRANSESOPHAGEAL ECHOCARDIOGRAM;  Surgeon: Parke Poisson, MD;  Location: [facility] IN[facility]IVE CV LAB;  Service: Cardiovascular;  Laterality: N/A;    Allergies  Allergen Reactions   Amoxicillin Hives   Topiramate     Caused mood swing   Penicillins Rash    Objective: Physical Exam General: The patient is alert and oriented x3 in no acute distress.  Dermatology: Hyperkeratotic, discolored, thickened, onychodystrophy noted. Skin is warm, dry and supple bilateral lower extremities. Negative for open lesions or macerations.  Vascular: Palpable pedal pulses bilaterally. No edema or  erythema noted. Capillary refill within normal limits.  Neurological: Grossly intact via light touch  Musculoskeletal Exam: No pedal deformity noted  Assessment: #1 Onychomycosis of toenails bilateral  Plan of Care:  -Patient was evaluated. -Mechanical debridement of nails 1-5 was performed using a nail nipper without incident or bleeding -No biopsy was also obtained of the left hallux nail plate.  There is no biopsy was sent to pathology for fungal culture -Today we discussed different treatment options including oral, topical, and laser antifungal treatment modalities.  We discussed their efficacies and side effects.  Patient opts for oral antifungal treatment modality -Prescription for Lamisil 250 mg #90 daily. Pt denies a history of liver pathology or symptoms.  CMP 05/20/2023 WNL -Return to clinic 6 months   Felecia Shelling, DPM [facility]  Dr. Felecia Shelling, DPM    [address] [place], [place] [address]                Office [phone]  Fax [phone]

## 2023-10-01 NOTE — Addendum Note (Signed)
 Addended by: Demetrio Lapping E on: 10/01/2023 10:16 AM   Modules accepted: Orders

## 2023-10-01 NOTE — Addendum Note (Signed)
 Addended by: Demetrio Lapping E on: 10/01/2023 10:20 AM   Modules accepted: Orders

## 2023-10-13 ENCOUNTER — Other Ambulatory Visit: Payer: Self-pay | Admitting: Orthopedic Surgery

## 2023-10-20 ENCOUNTER — Other Ambulatory Visit: Payer: Self-pay | Admitting: Podiatry

## 2023-11-03 ENCOUNTER — Encounter: Payer: Self-pay | Admitting: Orthopedic Surgery

## 2023-11-03 ENCOUNTER — Ambulatory Visit (INDEPENDENT_AMBULATORY_CARE_PROVIDER_SITE_OTHER): Payer: Medicaid Other | Admitting: Orthopedic Surgery

## 2023-11-03 DIAGNOSIS — G8929 Other chronic pain: Secondary | ICD-10-CM | POA: Diagnosis not present

## 2023-11-03 DIAGNOSIS — M25562 Pain in left knee: Secondary | ICD-10-CM

## 2023-11-03 DIAGNOSIS — M1710 Unilateral primary osteoarthritis, unspecified knee: Secondary | ICD-10-CM

## 2023-11-03 DIAGNOSIS — M1711 Unilateral primary osteoarthritis, right knee: Secondary | ICD-10-CM

## 2023-11-03 DIAGNOSIS — M25561 Pain in right knee: Secondary | ICD-10-CM | POA: Diagnosis not present

## 2023-11-03 MED ORDER — METHYLPREDNISOLONE ACETATE 40 MG/ML IJ SUSP
40.0000 mg | Freq: Once | INTRAMUSCULAR | Status: AC
Start: 2023-11-03 — End: 2023-11-03
  Administered 2023-11-03: 40 mg via INTRA_ARTICULAR

## 2023-11-03 NOTE — Progress Notes (Signed)
 Chief Complaint  Patient presents with   Injections    Both knees    Encounter Diagnoses  Name Primary?   Chronic pain of right knee Yes   Chronic pain of left knee    Primary osteoarthritis of right knee    Primary localized osteoarthritis of knee-LEFT     Procedure note for bilateral knee injections  Procedure note left knee injection verbal consent was obtained to inject left knee joint  Timeout was completed to confirm the site of injection  The medications used were 40 mg depomedrol and 3 cc of 1% lidocaine   Anesthesia was provided by ethyl chloride and the skin was prepped with alcohol.  After cleaning the skin with alcohol a 20-gauge needle was used to inject the left knee joint. There were no complications. A sterile bandage was applied.   Procedure note right knee injection verbal consent was obtained to inject right knee joint  Timeout was completed to confirm the site of injection  The medications used were 40 mg depomedrol and 3 cc of 1% lidocaine   Anesthesia was provided by ethyl chloride and the skin was prepped with alcohol.  After cleaning the skin with alcohol a 20-gauge needle was used to inject the right knee joint. There were no complications. A sterile bandage was applied.

## 2023-11-03 NOTE — Patient Instructions (Signed)
 You have received an injection of steroids into the joint. 15% of patients will have increased pain within the 24 hours postinjection.   This is transient and will go away.   We recommend that you use ice packs on the injection site for 20 minutes every 2 hours and extra strength Tylenol 2 tablets every 8 as needed until the pain resolves.  If you continue to have pain after taking the Tylenol and using the ice please call the office for further instructions.

## 2024-01-16 ENCOUNTER — Other Ambulatory Visit: Payer: Self-pay | Admitting: Podiatry

## 2024-01-19 ENCOUNTER — Telehealth: Payer: Self-pay | Admitting: Neurology

## 2024-01-19 NOTE — Telephone Encounter (Signed)
 Pt made a request for an appointment re: migraines

## 2024-01-24 ENCOUNTER — Other Ambulatory Visit: Payer: Self-pay | Admitting: Podiatry

## 2024-01-24 DIAGNOSIS — Z79899 Other long term (current) drug therapy: Secondary | ICD-10-CM

## 2024-01-24 NOTE — Progress Notes (Signed)
 Updated order for LFTs.  If WNL we will refill Lamisil  250 mg #90 daily  Thresa EMERSON Sar, DPM [facility]  Dr. Thresa EMERSON Sar, DPM    [address] [place], [place] [address]                Office [phone]  Fax [phone]

## 2024-01-30 LAB — HEPATIC FUNCTION PANEL
ALT: 17 IU/L (ref 0–32)
AST: 17 IU/L (ref 0–40)
Albumin: 4.1 g/dL (ref 3.9–4.9)
Alkaline Phosphatase: 61 IU/L (ref 44–121)
Bilirubin Total: 0.3 mg/dL (ref 0.0–1.2)
Bilirubin, Direct: 0.11 mg/dL (ref 0.00–0.40)
Total Protein: 6.5 g/dL (ref 6.0–8.5)

## 2024-02-02 ENCOUNTER — Ambulatory Visit: Admitting: Orthopedic Surgery

## 2024-02-05 ENCOUNTER — Ambulatory Visit (INDEPENDENT_AMBULATORY_CARE_PROVIDER_SITE_OTHER): Admitting: Orthopedic Surgery

## 2024-02-05 ENCOUNTER — Encounter: Payer: Self-pay | Admitting: Orthopedic Surgery

## 2024-02-05 ENCOUNTER — Other Ambulatory Visit (INDEPENDENT_AMBULATORY_CARE_PROVIDER_SITE_OTHER): Payer: Self-pay

## 2024-02-05 VITALS — Ht 66.0 in | Wt 241.0 lb

## 2024-02-05 DIAGNOSIS — M25572 Pain in left ankle and joints of left foot: Secondary | ICD-10-CM

## 2024-02-05 DIAGNOSIS — G8929 Other chronic pain: Secondary | ICD-10-CM

## 2024-02-05 DIAGNOSIS — M170 Bilateral primary osteoarthritis of knee: Secondary | ICD-10-CM | POA: Diagnosis not present

## 2024-02-05 DIAGNOSIS — M1710 Unilateral primary osteoarthritis, unspecified knee: Secondary | ICD-10-CM

## 2024-02-05 DIAGNOSIS — M1711 Unilateral primary osteoarthritis, right knee: Secondary | ICD-10-CM

## 2024-02-05 MED ORDER — METHYLPREDNISOLONE ACETATE 40 MG/ML IJ SUSP
40.0000 mg | Freq: Once | INTRAMUSCULAR | Status: AC
Start: 2024-02-05 — End: 2024-02-05
  Administered 2024-02-05: 40 mg via INTRA_ARTICULAR

## 2024-02-05 NOTE — Progress Notes (Signed)
  Intake history:  Ht 5' 6 (1.676 m)   Wt 241 lb (109.3 kg)   BMI 38.90 kg/m  Body mass index is 38.9 kg/m.    WHAT ARE WE SEEING YOU FOR TODAY?  Knee injections bilateral  2. left ankle(s)  How long has this bothered you? (DOI?DOS?WS?)  on beach trip last week twisted foot/ ankle on left has pain medial since   Anticoag.  No/ has history of pulmonary embolism   Diabetes No  Heart disease Yes also history of CVA   Hypertension No  SMOKING HX No  Kidney disease No  Any ALLERGIES ____________ Allergies  Allergen Reactions   Amoxicillin Hives   Topiramate      Caused mood swing   Penicillins Rash   __________________________________   Treatment:  Have you taken:  Tylenol  Yes  Advil  Yes  Had PT No  Had injection No  Other  _________________________

## 2024-02-05 NOTE — Progress Notes (Addendum)
 Chief Complaint  Patient presents with   Knee Pain    Both / wants injections    Jaime Massey twisted her left foot and ankle at the beach about a week ago has medial lateral ankle pain with tenderness over the medial malleolus medial deltoid pain with eversion and then tenderness over the anterior talofibular ligament with a stable anterior drawer sign no fibular tenderness  X-ray obtained  Bilateral knee injections history of chronic knee pain arthritis  Encounter Diagnoses  Name Primary?   Pain in left ankle and joints of left foot Yes   Primary osteoarthritis of right knee    Chronic pain of right knee    Chronic pain of left knee    Primary localized osteoarthritis of knee-LEFT    DG Ankle Complete Left Result Date: 02/05/2024 Left ankle.  Twisted left foot and ankle at the beach last week.  Medial lateral pain medial lateral tenderness X-rays show no fracture dislocation ankle mortise stable Impression normal without fracture     RECHECK PRN  INJECT PRN   Procedure note for bilateral knee injections  Procedure note left knee injection verbal consent was obtained to inject left knee joint  Timeout was completed to confirm the site of injection  The medications used were 40 mg depomedrol and 3 cc of 1% lidocaine   Anesthesia was provided by ethyl chloride and the skin was prepped with alcohol.  After cleaning the skin with alcohol a 20-gauge needle was used to inject the left knee joint. There were no complications. A sterile bandage was applied.   Procedure note right knee injection verbal consent was obtained to inject right knee joint  Timeout was completed to confirm the site of injection  The medications used were 40 mg depomedrol and 3 cc of 1% lidocaine   Anesthesia was provided by ethyl chloride and the skin was prepped with alcohol.  After cleaning the skin with alcohol a 20-gauge needle was used to inject the right knee joint. There were no complications. A sterile  bandage was applied.

## 2024-02-09 ENCOUNTER — Other Ambulatory Visit: Payer: Self-pay | Admitting: Podiatry

## 2024-02-09 MED ORDER — TERBINAFINE HCL 250 MG PO TABS: 250.0000 mg | ORAL_TABLET | Freq: Every day | ORAL | 0 refills | Status: AC

## 2024-02-09 NOTE — Progress Notes (Signed)
 Normal liver function panel results. Refill Lamisil  250mg  #90 daily  Thresa EMERSON Sar, DPM [facility]  Dr. Thresa EMERSON Sar, DPM    [address] [place], [place] [address]                Office [phone]  Fax [phone]

## 2024-02-18 ENCOUNTER — Other Ambulatory Visit: Payer: Self-pay | Admitting: Orthopedic Surgery

## 2024-02-18 MED ORDER — PREDNISONE 10 MG PO TABS: 10.0000 mg | ORAL_TABLET | ORAL | 0 refills | Status: DC | PRN

## 2024-02-18 NOTE — Telephone Encounter (Signed)
Prednisone

## 2024-03-15 ENCOUNTER — Encounter: Payer: Self-pay | Admitting: Neurology

## 2024-03-15 ENCOUNTER — Ambulatory Visit: Admitting: Neurology

## 2024-03-15 VITALS — BP 115/73 | HR 65 | Ht 69.0 in | Wt 254.6 lb

## 2024-03-15 DIAGNOSIS — E538 Deficiency of other specified B group vitamins: Secondary | ICD-10-CM | POA: Diagnosis not present

## 2024-03-15 DIAGNOSIS — G2581 Restless legs syndrome: Secondary | ICD-10-CM

## 2024-03-15 DIAGNOSIS — G459 Transient cerebral ischemic attack, unspecified: Secondary | ICD-10-CM

## 2024-03-15 DIAGNOSIS — R5383 Other fatigue: Secondary | ICD-10-CM

## 2024-03-15 DIAGNOSIS — R7309 Other abnormal glucose: Secondary | ICD-10-CM

## 2024-03-15 DIAGNOSIS — R202 Paresthesia of skin: Secondary | ICD-10-CM | POA: Diagnosis not present

## 2024-03-15 NOTE — Progress Notes (Unsigned)
 [facility]    Provider:  Dr Ines Requesting Provider: Tobie Guy, DO Primary Care Provider:  Tobie Guy, DO  CC:  Numbness  03/15/2024: here for updates on migraines and numbness.   Migraines: she has headaches but the migraines are improved, 4 a month, she was on Ajovy  but stopped felt it wasn;t helping. Dr Buck negative 09/2022 Patient understands that her sleep study did not show any significant obstructive sleep apnea. Intermittent mild to moderate snoring was detected. CPAP or AutoPap therapy treatment is not indicated. Weight loss and avoiding the supine sleep position may aid in reducing her snoring.  Next try qulipta or botpx for migraines  From a thorough review of records, meds tried >85months that can be used for migraines/headaches include: tylenol , welbutrin,buspar, gabapentin, advil ,lamictal,meloxicam , zofran ,prednisone ,trazodone, topamax , tramadol , BP meds contraindicated due to hypotension, amitriptyline/nortriptyline contraindicated due to already being on seratonergic drugs and risk of seratonin syndrome, aimovig contraindicated due to constipation, ajovy   Numbness in the hands and feet: has had extensive testing in the past. Will repeat b12 and other serum testing:  emg/ncs since last visit shwed normal upper extremity and lower extremity with possible spontaneous activity in the lumbar paraspinals otherwie normal nerve conductions and emg/need exam of one upper and one lower extremity for numbness.  Follow up MRI lumbar spine significant for: At L4-L5, there is a left paramedian disc protrusion and mild facet hypertrophy with left ligamentum flavum hypertrophy causing moderate left lateral recess stenosis and mild spinal stenosis. There is no definite nerve root compression though the left L5 nerve root is slightly displaced posteriorly at the lateral recess. Previous had low B12, 10/01/2016 263 at that time had extensive workup including autoimmune  workup. Hgba1c 2023 and 2024 last 05/2023 nml. Ana/lyme/lupus and other bloodwork 12/2022 also without cause for her sensory symptoms.05/20/2023 tsh nml. MRi cervical spine 02/2023 was also completed with mild disc degenerative changes throughout most prominent at C6-7 where there is a broad-based disc osteophyte protrusion resulting in moderate left-sided foraminal narrowing but no definite compression. MRI brain 07/19/2022 unremarkable.   TIA(2021 right side, 2023 left side)Had stroke workups in the past:  Continue asa and maintain strict control of hypertension with blood pressure goal below 130/90, diabetes with hemoglobin A1c goal below 6.5% and lipids with LDL cholesterol goal below 70 mg/dL.I also advised the patient to eat a healthy diet, exercise regularly and maintain ideal body weight .Followup in the future with me in  2 months or call earlier if necessary.  Also restless legs:  check ferritin  Patient complains of symptoms per HPI as well as the following symptoms: none . Pertinent negatives and positives per HPI. All others negative   01/14/2023: 25 headache days a month. 12 moderate to severe migraines can last 24-72 hours. Ajovy  or emgality. She has a mirena IUD. May need a urine test. Nurses call and schedule an appt to inject you on AJovy  and take a urine test. Waiting on mri cervical spine.  If the MRI cervical spine is negative, do an emg/ncs. Also have office call to schedule emg/ncs and can cancel if we find our answers in the mri cervical spine.  We discussed at length. Nurse will call and schedule an appt to inject her on AJovy  and take a urine test.  From a thorough review of records, meds tried >57months that can be used for migraines/headaches include: tylenol , welbutrin,buspar, gabapentin, advil ,lamictal,meloxicam , zofran ,prednisone ,trazodone, topamax , tramadol , BP meds contraindicated due to hypotension, amitriptyline/nortriptyline contraindicated due to already being  on  seratonergic drugs and risk of seratonin syndrome, aimovig contraindicated due to constipation  Reviewed MRI brain images and agree: 07/19/2022:FINDINGS: Brain: Cerebral volume is stable and within normal limits. Mild asymmetry of the lateral ventricles is most likely normal anatomic variation.   No restricted diffusion to suggest acute infarction. No midline shift, mass effect, evidence of mass lesion, ventriculomegaly, extra-axial collection or acute intracranial hemorrhage. Cervicomedullary junction and pituitary are within normal limits.   Elnor and white matter signal throughout the brain appears stable from last month and within normal limits for age. Minimal (series 15, image 26), nonspecific white matter T2 and FLAIR hyperintensity extent is normal for this age group. No cortical encephalomalacia or chronic cerebral blood products identified.   No abnormal enhancement identified. No dural thickening or enhancement.   Vascular: Major intracranial vascular flow voids are stable. Major dural venous sinuses are enhancing and appear to be patent.   Skull and upper cervical spine: Stable visible cervical spine with mild C3-C4 disc degeneration, spondylolisthesis. Visualized bone marrow signal is within normal limits.   Sinuses/Orbits: Stable, negative.   Other: Mastoids remain clear. Visible internal auditory structures appear normal. Negative visible scalp and face.   IMPRESSION: Stable and normal for age MRI appearance of the Brain.  Patient complains of symptoms per HPI as well as the following symptoms: migraines, constipation . Pertinent negatives and positives per HPI. All others negative   07/02/2022: We saw patient in 2021 for numbness. We completed an entire stroke workup. MRI brain and c-spine,emg/ncs, echo, CTA H&N, TCD in 2021 wasnormal an right-sided. 05/22/2022 She was at work and left cheek went numb, she had a facial droop, [facility] took [facility] to [facility], she had  Procedures:CT head, Echocardiogram,MRI brain,CTA head and neck with contrast, CT head without contrast. She had a very bad headaches. She has headaches, she has a hx of migraines, she had a sharp pain on the left side of the head, followed by a severe headache for days, left side, light and sound sensitivity. They gave her TPA. Tobgue deviation, She wa still having facial droop during the imaging and tongue to the right lasted for 24 hours, arm and left le weak, she was not on aspirin , nothing since 2021. No palpitations. She is wearing a 14-day heart monitor. Snoring is terrible, partner will not sleep in the same room, he even recorded it, wakes with morning headache and dry mouth. She was having a difficult time talking, sounds like dysarthri and not aphasia. Everything is resolved. No other focal neurologic deficits, associated symptoms, inciting events or modifiable factors.   Patient complains of symptoms per HPI as well as the following symptoms: TIA . Pertinent negatives and positives per HPI. All others negative   HPI 05/16/2020:  Jaime Massey is a 47 y.o. female here as requested by Tobie Guy, DO for Numbness and tingling in arms and legs needs NCV.  Past medical history migraine with aura, anxiety, obesity, asthma, chronic left knee pain, depression, daily persistent headache onset August 17, 2016 and fever of unknown origin, history of pulmonary embolus in 2011 6 weeks status post C-section, tobacco abuse, prior warfarin use.  I reviewed Dr. Pryor examination which showed normal general, HEENT, neck, heart, lungs, abdomen, extremities, skin, neuro and psych examinations.  I reviewed Dr. Susa notes: Patient reports tingling on the right side, a few weeks prior she had hip pain and popping, reports hip pain during appointment, has numbness in her right leg and right arm, she  is also having headaches which are chronic, blood pressure was good today, has not had her eyes checked this  year, she reports being at home since June 2020, feels like she is always stressed, more so with her grown children who are 24, 22 and 17, she does report a lot of stress.  Patient is here alone and she reports numbness, in August 2021 she said her right side went numb at the end of vacation, she saw her doctor and she started gabapentin, does help some but she still has the numbness and feels it is in her hands and foot, it is not the sharp tingling as it was before, she also has chronic headaches.In August they went on vacation, no significant exercise, she took a shower one evening and her right foot was swollen like a balloon, she went to bed and whole right side was numb, face, arm and leg, she drove home and thought she was going to have to pull over. She was dizzy, no known facial droop, no vision changes or talking problems, it never resolved, Gabapentin may help with the symptoms its a sharp tingling but her hand and right foot are still numb. Acute onset. When her right foot was swollen the lower leg sore. Symptoms are in the entire hand on the right, right foot feels different. The right side of the face was definitely involved initially. 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   Reviewed notes, labs and imaging from outside physicians, which showed:  MRI of the brain wo contrast: reviewed report no acute intracranial process  04/12/2020: CBC and CMP  unremarkable, BUN 9, Creat 0.69, hgba1c 5.2 02/23/2020: Hgba1c 5.2, TSH 0.831  Review of Systems: Patient complains of symptoms per HPI as well as the following symptoms: Blurred vision, feeling hot, feeling cold, increased thirst, snoring, swelling in legs, fatigue, ringing in ears, constipation, joint pain, joint swelling, cramps, aching muscles, headache, numbness, weakness, dizziness, sleepiness, restless legs, depression, anxiety, too much sleep, not in of sleep, disinterest in  activity and decreased energy. Pertinent negatives and positives per HPI. All others negative.   Social History   Socioeconomic History   Marital status: Significant Other    Spouse name: Not on file   Number of children: 4   Years of education: Not on file   Highest education level: Some college, no degree  Occupational History   Not on file  Tobacco Use   Smoking status: Former    Current packs/day: 0.00    Average packs/day: 0.5 packs/day for 26.0 years (13.0 ttl pk-yrs)    Types: Cigarettes    Start date: 10/1991    Quit date: 10/2017    Years since quitting: 6.4   Smokeless tobacco: Never  Vaping Use   Vaping status: Never Used  Substance and Sexual Activity   Alcohol use: Not Currently    Comment: occ beer   Drug use: No   Sexual activity: Yes    Birth control/protection: I.U.D.  Other Topics Concern   Not on file  Social History Narrative   Lives at home with son and boyfriend   Right handed   Caffeine: soda daily   Pt works    Social Drivers of Corporate investment banker Strain: Low Risk  (05/20/2023)   Received [facility]   Overall Financial Resource Strain (CARDIA)    Difficulty of Paying Living Expenses: Not  hard at all  Food Insecurity: No Food Insecurity (05/20/2023)   Received from [facility]   Hunger Vital Sign    Within the past 12 months, you worried that your food would run out before you got the money to buy more.: Never true    Within the past 12 months, the food you bought just didn't last and you didn't have money to get more.: Never true  Transportation Needs: No Transportation Needs (05/20/2023)   Received from [facility] - Transportation    Lack of Transportation (Medical): No    Lack of Transportation (Non-Medical): No  Physical Activity: Sufficiently Active (05/20/2023)   Received from [facility]   Exercise Vital Sign    On average, how many days per week do you engage in moderate to strenuous  exercise (like a brisk walk)?: 4 days    On average, how many minutes do you engage in exercise at this level?: 40 min  Recent Concern: Physical Activity - Inactive (04/08/2023)   Received from [facility]   Exercise Vital Sign    Days of Exercise per Week: 0 days    Minutes of Exercise per Session: 0 min  Stress: Stress Concern Present (05/20/2023)   Received from [facility] - Occupational Stress Questionnaire    Feeling of Stress : To some extent  Social Connections: Moderately Integrated (05/20/2023)   Received from [facility]   Social Connection and Isolation Panel    In a typical week, how many times do you talk on the phone with family, friends, or neighbors?: More than three times a week    How often do you get together with friends or relatives?: Three times a week    How often do you attend church or religious services?: 1 to 4 times per year    Do you belong to any clubs or organizations such as church groups, unions, fraternal or athletic groups, or school groups?: No    How often do you attend meetings of the clubs or organizations you belong to?: 1 to 4 times per year    Are you married, widowed, divorced, separated, never married, or living with a partner?: Never married  Intimate Partner Violence: Not At Risk (06/03/2023)   Received from [facility]   Humiliation, Afraid, Rape, and Kick questionnaire    Within the last year, have you been afraid of your partner or ex-partner?: No    Within the last year, have you been humiliated or emotionally abused in other ways by your partner or ex-partner?: No    Within the last year, have you been kicked, hit, slapped, or otherwise physically hurt by your partner or ex-partner?: No    Within the last year, have you been raped or forced to have any kind of sexual activity by your partner or ex-partner?: No    Family History  Problem Relation Age of Onset   Breast cancer  Mother    Aortic aneurysm Father    Migraines Paternal Aunt    Breast cancer Maternal Grandmother    Skin cancer Maternal Grandmother    Cancer Maternal Grandfather    Diabetes Paternal Grandmother    Heart disease Paternal Grandfather    Stroke Neg Hx        none aware of    Neuropathy Neg Hx        none aware of  Past Medical History:  Diagnosis Date   Anxiety    Arthritis    Asthma due to environmental allergies    pt states she has never had asthma problems    Depression    DVT (deep venous thrombosis) (HCC)    post c-section   Migraine    PE (pulmonary embolism)    post c-section   Seizures (HCC)    as child from severe ear infections- no meds now and no seizures since age 59   Warfarin anticoagulation    took from 03/2010-12/2010.     Patient Active Problem List   Diagnosis Date Noted   Greater trochanteric pain syndrome 08/01/2023   Mitral leaflet abnormality 02/25/2023   Chronic migraine without aura without status migrainosus, not intractable 01/15/2023   Cerebrovascular accident (CVA) (HCC) 06/19/2022   History of pulmonary embolus (PE) 06/19/2022   Spinal stenosis of cervical region 08/30/2020   Numbness and tingling of right arm and leg 07/31/2020   S/P knee surgery patella chondroplasty 03/05/17 02/12/2018   Obesity 12/19/2017   Chondromalacia of lateral femoral condyle, right    Chondromalacia of medial condyle of right femur    Chondromalacia patellae of right knee    Rupture of anterior cruciate ligament of right knee    Cyst of anterior horn of lateral meniscus of right knee    Adjustment disorder with anxious mood 02/15/2017   Bandemia without diagnosis of specific infection 02/15/2017   Pulmonary embolus (HCC) 02/15/2017   FUO (fever of unknown origin) 02/15/2017   New daily persistent headache 02/15/2017   Arthritis of knee, right 12/26/2016   Bone spur 12/03/2016   Fever of unknown origin 10/01/2016    Past Surgical History:  Procedure  Laterality Date   CESAREAN SECTION     CHOLECYSTECTOMY N/A 12/21/2012   Procedure: LAPAROSCOPIC CHOLECYSTECTOMY;  Surgeon: Thresa JAYSON Pulling, MD;  Location: [facility];  Service: General;  Laterality: N/A;   KNEE ARTHROSCOPY WITH MEDIAL MENISECTOMY Right 03/05/2017   Procedure: chondroplasty patella, medial femoral condyle;  Surgeon: Margrette Taft BRAVO, MD;  Location: [facility];  Service: Orthopedics;  Laterality: Right;   TEE WITHOUT CARDIOVERSION N/A 12/03/2022   Procedure: TRANSESOPHAGEAL ECHOCARDIOGRAM;  Surgeon: Loni Soyla LABOR, MD;  Location: [facility] INVASIVE CV LAB;  Service: Cardiovascular;  Laterality: N/A;    Current Outpatient Medications  Medication Sig Dispense Refill   acetaminophen  (TYLENOL ) 500 MG tablet Take 1,500 mg by mouth in the morning.     aspirin  EC 81 MG tablet Take 1 tablet (81 mg total) by mouth daily. Swallow whole. (Patient taking differently: Take 81 mg by mouth at bedtime. Swallow whole.) 30 tablet 11   busPIRone (BUSPAR) 5 MG tablet Take 5 mg by mouth 3 (three) times daily.     citalopram (CELEXA) 20 MG tablet Take by mouth.     gabapentin (NEURONTIN) 100 MG capsule Take 100 mg by mouth at bedtime.     levonorgestrel (MIRENA, 52 MG,) 20 MCG/24HR IUD 1 each by Intrauterine route once.     meloxicam  (MOBIC ) 7.5 MG tablet Take 1 tablet (7.5 mg total) by mouth 2 (two) times daily. 60 tablet 5   omeprazole (PRILOSEC) 20 MG capsule Take 20 mg by mouth daily as needed (Heatburn).     predniSONE  (DELTASONE ) 10 MG tablet Take 1 tablet (10 mg total) by mouth as needed. 60 tablet 0   RESTASIS 0.05 % ophthalmic emulsion Place 1 drop into both eyes daily as needed (allergies in eyes).  terbinafine  (LAMISIL ) 250 MG tablet Take 1 tablet (250 mg total) by mouth daily. 90 tablet 0   traZODone (DESYREL) 50 MG tablet Take 50 mg by mouth at bedtime.     VENTOLIN HFA 108 (90 Base) MCG/ACT inhaler Inhale into the lungs.     No current facility-administered medications for this visit.    Facility-Administered Medications Ordered in Other Visits  Medication Dose Route Frequency Provider Last Rate Last Admin   sodium chloride  flush (NS) 0.9 % injection 16 mL  16 mL Intracatheter PRN Ines Onetha NOVAK, MD        Allergies as of 03/15/2024 - Review Complete 03/15/2024  Allergen Reaction Noted   Amoxicillin Hives 02/03/2017   Topiramate   11/07/2022   Penicillins Rash 06/25/2014    Vitals: BP 115/73   Pulse 65   Ht 5' 9 (1.753 m)   Wt 254 lb 9.6 oz (115.5 kg)   BMI 37.60 kg/m  Last Weight:  Wt Readings from Last 1 Encounters:  03/15/24 254 lb 9.6 oz (115.5 kg)   Last Height:   Ht Readings from Last 1 Encounters:  03/15/24 5' 9 (1.753 m)     Physical exam: Exam: Gen: NAD, conversant      CV: No palpitations or chest pain or SOB. VS: Breathing at a normal rate. obese. Not febrile. Eyes: Conjunctivae clear without exudates or hemorrhage  Neuro: Detailed Neurologic Exam  Speech:    Speech is normal; fluent and spontaneous with normal comprehension.  Cognition:    The patient is oriented to person, place, and time;     recent and remote memory intact;     language fluent;     normal attention, concentration, fund of knowledge Cranial Nerves:    The pupils are equal, round, and reactive to light. Visual fields are full Extraocular movements are intact.  The face is symmetric with normal sensation. The palate elevates in the midline. Hearing intact. Voice is normal. Shoulder shrug is normal. The tongue has normal motion without fasciculations.   Coordination: normal  Gait:    No abnormalities noted or reported  Motor Observation:   no involuntary movements noted. Tone:    Appears normal  Posture:    Posture is normal. normal erect    Strength:    Strength is anti-gravity and symmetric in the upper and lower limbs.      Sensation: intact to LT, no reports of numbness or tingling or paresthesias        Assessment/Plan:   48 year old  patient here to follow up for multiple concerns. 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; Chondromalacia patellae of right knee; Rupture of anterior cruciate ligament of right knee; Cyst of anterior horn of lateral meniscus of right knee; S/P knee surgery patella chondroplasty 03/05/17; Adjustment disorder with anxious mood; Bandemia without diagnosis of specific infection; Obesity; Pulmonary embolus (HCC); FUO (fever of unknown origin); Numbness and tingling of right arm and leg; New daily persistent headache; Spinal stenosis of cervical region; Cerebrovascular accident (CVA) (HCC); History of pulmonary embolus (PE); Chronic migraine without aura without status migrainosus, not intractable; Mitral leaflet abnormality; and Greater trochanteric pain syndrome on their problem list.  1. Cerebrovascular disease vs migraine aura  History of transient neurologic events in 2021 (right-sided numbness) and 2023 (left-sided facial droop, tongue deviation, hemiparesis) with extensive negative stroke workups including MRI brain, CTA head/neck, echocardiogram, TCD, Holter  monitoring, and laboratory testing for hypercoagulable states. TPA was administered during the 2023 episode.  Given recurrent stereotyped events, negative vascular and cardiac workup, and concurrent migrainous headache symptoms (sharp unilateral head pain, photophobia, phonophobia, prolonged headache), the episodes remain most consistent with migraine with aura rather than ischemic stroke/TIA.  Plan: continue aspirin  for secondary prevention given vascular risk history. Reinforce strict risk factor control: blood pressure <130/90, LDL <70, HbA1c <6.5%. Lifestyle counseling provided regarding diet, exercise, and weight management.  2. Chronic migraine, without aura (baseline) with intermittent migraine with aura  Current burden:  ~4 migraines/month. Previously trialed and discontinued Ajovy  (ineffective).  Prior migraine prophylactic therapies tried for >2 months and failed or contraindicated include: topiramate , gabapentin, lamotrigine, amitriptyline/nortriptyline (serotonergic risk), beta-blockers (hypotension), Aimovig (constipation), Ajovy , buspirone, Wellbutrin, trazodone, multiple NSAIDs, prednisone .  Plan: If needed, Consider next-line options including Qulipta (atogepant), Nurtec prn, Vyepti or Botox injections for prevention. Maintain acute therapy (as tolerated).can try nurtec or ubrelvy if needed  3. Peripheral sensory symptoms (numbness/tingling in hands and feet)  Extensive prior workup negative for structural or inflammatory neuropathy. EMG/NCS normal except for possible paraspinal spontaneous activity; no large-fiber neuropathy identified.  MRI cervical spine (02/2023): mild degenerative changes, broad-based C6-7 disc osteophyte with moderate left foraminal narrowing; no cord compression.  MRI lumbar spine (2024): left L4-L5 disc protrusion with ligamentum flavum hypertrophy causing moderate left lateral recess stenosis and mild central canal stenosis, without clear nerve root compression.  Prior B12 deficiency (2018: 263 pg/mL), repeat labs ordered for B12, folate, TSH, HbA1c, ANA, Lyme, lupus, and metabolic causes (all recent values normal). HbA1c most recent 05/2023 normal.  Plan: repeat B12 and serum metabolic studies. Monitor progression of sensory symptoms.  Reached out to Podiatry for small-fiber biopsy(appreciate out colleagues tremendously)  5. Restless legs syndrome  Symptomatic.  Plan: check serum ferritin; consider iron supplementation if <75 ng/mL.  6. Obesity and sleep-related symptoms  Prior sleep study (09/2022) showed mild-moderate intermittent snoring without obstructive sleep apnea. CPAP not indicated.  Lifestyle counseling: weight reduction, avoidance of supine sleep, sleep  hygiene.  7. Follow-Up:  Re-evaluate in 6 months, sooner if neurologic symptoms worsen or new deficits develop.  Continue multidisciplinary management including primary care for cardiovascular risk modification, weight management, and orthopedic follow-up for chronic right knee pathology.  Will check peripheral neuropathy labs  Orders Placed This Encounter  Procedures   B12 and Folate Panel   Methylmalonic acid, serum   Vitamin B6   Multiple Myeloma Panel (SPEP&IFE w/QIG)   Vitamin B1   Ferritin   Vitamin D , 25-hydroxy   Lipid Panel   CBC with Differential/Platelets   Comprehensive metabolic panel with GFR   TSH Rfx on Abnormal to Free T4   Hemoglobin A1c   TSH Rfx on Abnormal to Free T4   No orders of the defined types were placed in this encounter.   Cc: Tobie Guy, DO,  Tobie Guy, DO  Onetha Epp, MD  [facility] [address] [place], [place] [address]  Phone [phone] Fax [phone]  I spent 65 minutes of face-to-face and non-face-to-face time with patient on the  1. B12 deficiency   2. Paresthesias   3. RLS (restless legs syndrome)   4. Other fatigue   5. TIA (transient ischemic attack)   6. Elevated glucose    diagnosis.  This included previsit chart review, lab review, study review, order entry, electronic health record documentation, patient education on the different diagnostic and therapeutic options, counseling and coordination of care,  risks and benefits of management, compliance, or risk factor reduction

## 2024-03-15 NOTE — Patient Instructions (Addendum)
 Blood work today for other causes of numbness(b12,b1 and several other labs) Restless legs: ferritin, continue gabapentin Fatigue/memory: recheck b12 and vit d, ts and other labs TIA in the past: cholesterol panel, hgba1c, continue aspirin  Will contact podiatry and see if he can provide a skin biopsy for small-fiber neuropathy   Restless Legs Syndrome: What to Know Restless legs syndrome (RLS) is a condition that makes your legs feel uncomfortable, especially while sitting or lying down. This discomfort makes you want to move your legs. Sometimes, your arms can feel this way too. RLS can be mild or severe. It can make it hard for you to sleep. What are the causes? The cause of RLS isn't known. What increases the risk? You're more likely to get RLS if: You're older than 47 years of age. You're pregnant. You're female. Someone in your family has RLS. You don't have enough iron in your body. You smoke, drink alcohol, or drink too much caffeine. You have certain health problems like kidney disease, Parkinson's disease, or nerve damage. You take certain medicines for high blood pressure, feeling like you may throw up (nausea), colds, allergies, depression, or heart problems. What are the signs or symptoms? The main symptom of RLS is uncomfortable feelings in the legs, such as: Pulling. Tingling. Prickling. Throbbing. Crawling. Burning. Usually, the feelings: Happen on both sides of your body. Get worse when you sit or lie down. Get worse at night, making it hard to sleep. Make you want to move your legs. Feel better when you move your legs or stand up. Sometimes, your arms can feel this way too. People with RLS often feel tired during the day because they don't sleep well at night. How is this diagnosed? RLS may be diagnosed based on: Your symptoms. Blood tests. In some cases, you may be monitored in a sleep lab by a specialist. This is called a sleep study. It can help find out  if something is stopping you from sleeping well. How is this treated? RLS is treated by managing the symptoms. This may include: Lifestyle changes, such as exercising, using relaxation techniques, and avoiding caffeine, alcohol, or tobacco. Iron supplements. Medicines. Medicines for Parkinson's disease may be tried first. Medicines to prevent seizures may also help. Special foot wraps made for people with RLS. Vibration pads applied to the back of your legs. A device worn on the lower legs that makes the muscles move. This helps lessen the uncomfortable feelings from RLS. Follow these instructions at home: Lifestyle  Use good sleep habits. Go to bed and wake up at the same time every day. Most adults need 7-9 hours of sleep each night. Get regular exercise. Try to get at least 30 minutes of exercise most days of the week. Find ways to relax, like deep breathing, yoga, or meditation. Avoid caffeine and alcohol. Do not smoke, vape, or use nicotine or tobacco. General instructions Take your medicines only as told. Try these methods to relieve the discomfort in your legs: Massage your legs. Walk or stretch. Take a cold or warm bath. Keep all follow-up visits. Your health care provider will check if the treatment is working. Where to find more information To learn more: Go to BasicFM.no. Click Search and type restless legs syndrome. Find the link you need. Contact a health care provider if: Your symptoms get worse. Your symptoms don't get better with treatment. This information is not intended to replace advice given to you by your health care provider. Make sure you discuss  any questions you have with your health care provider. Document Revised: 10/14/2023 Document Reviewed: 10/14/2023 Elsevier Patient Education  2025 Elsevier Inc.  Transient Ischemic Attack A transient ischemic attack (TIA) happens when blood supply to the brain is blocked temporarily. A TIA causes  stroke-like symptoms that go away quickly without causing any permanent damage. Having a TIA can be considered a warning sign for a stroke and should not be ignored. A person who has a TIA is at higher risk for a stroke. What are the causes? This condition is caused by a temporary blockage in an artery in the head or neck. This means the brain does not get the blood supply it needs. A blockage can be caused by: Fatty buildup in an artery in the head or neck (atherosclerosis). A blood clot traveling from the heart. An artery tear (dissection). Inflammation of an artery (vasculitis). Sometimes the cause is not known. What increases the risk? Certain factors make you more likely to develop this condition. Some of these are things you can change, including: Using products that contain nicotine or tobacco. Being inactive. Heavy alcohol use. Drug use, especially cocaine and methamphetamine. Medical conditions that may increase your risk include: High blood pressure (hypertension). High cholesterol. Diabetes. Heart disease (coronary artery disease). An irregular heartbeat, also called atrial fibrillation (AFib). Sickle cell disease. Blood clotting disorders (hypercoagulable state). Other risk factors include: Being over the age of 58. Being female. Obesity. Sleep problems such as sleep apnea. Family history of stroke. Previous history of blood clots, stroke, TIA, or heart attack. What are the signs or symptoms? Symptoms of a TIA are the same as those of a stroke. The symptoms develop suddenly, and then go away quickly. They may include: Dizziness, loss of balance and coordination, or trouble walking. Vision changes, such as double vision, blurred vision, or loss of vision. Weakness or numbness in your face, arm, or leg, especially on one side of your body. Trouble speaking, understanding speech, or both (aphasia). Nausea and vomiting. Severe headache. Confusion. If possible, note what  time your symptoms started. Tell your health care provider. How is this diagnosed? This condition may be diagnosed based on: Your symptoms and medical history. A physical exam. Imaging tests, usually a CT scan or MRI of the brain. Blood tests. You may also have other tests, including: Electrocardiogram (ECG). Echocardiogram. Continuous heart monitoring. Carotid ultrasound. A scan of blood circulation in the brain (CT angiogram or MR angiogram). How is this treated? The goal of treatment is to reduce the risk for a stroke. Stroke prevention therapies may include: Changes to diet and lifestyle, such as being physically active and stopping smoking. Treating other health conditions, such as diabetes or AFib. Medicines to thin the blood (antiplatelets or anticoagulants). Blood pressure medicines. Medicines to reduce cholesterol. If testing shows a narrowing in the arteries to your brain, your health care provider may recommend a procedure, such as: Carotid endarterectomy. This is done to remove the blockage from your artery. Carotid angioplasty and stenting. This uses a small mesh tube (stent) to open or widen an artery in the neck. The stent helps keep the artery open by supporting the artery walls. Follow these instructions at home: Medicines Take over-the-counter and prescription medicines only as told by your health care provider. If you were told to take a medicine to thin your blood, such as aspirin  or an anticoagulant, use it exactly as told by your health care provider. Taking too much blood-thinning medicine can cause bleeding. Taking  too little will not protect you against a stroke and other problems. Eating and drinking  Eat 5 or more servings of fruits and vegetables each day. Follow guidelines from your health care provider about your diet. You may need to follow a certain diet to help manage risk factors for stroke. This may include: Eating a low-fat, low-salt diet. Choosing  high-fiber foods. Limiting carbohydrates and sugar. If you drink alcohol: Limit how much you have to: 0-1 drink a day for women who are not pregnant. 0-2 drinks a day for men. Know how much alcohol is in your drink. In the [place] one drink equals one 12 oz bottle of beer (355 mL), one 5 oz glass of wine (148 mL), or one 1 oz glass of hard liquor (44 mL). General instructions Maintain a healthy weight. Try to get at least 30 minutes of exercise on most days. Get treatment if you have sleep apnea. Do not use any products that contain nicotine or tobacco. These products include cigarettes, chewing tobacco, and vaping devices, such as e-cigarettes. If you need help quitting, ask your health care provider. Do not use illegal drugs. Keep all follow-up visits. Your health care provider will want to know if you have any more symptoms and to check blood labs if any medicines were prescribed. Where to find more information American Stroke Association: stroke.org Get help right away if: You have chest pain. You have fast or irregular heartbeats (palpitations). You have any symptoms of a stroke. BE FAST is an easy way to remember the main warning signs of a stroke. B - Balance. Signs are dizziness, sudden trouble walking, or loss of balance. E - Eyes. Signs are trouble seeing or a sudden change in vision. F - Face. Signs are sudden weakness or numbness of the face, or the face or eyelid drooping on one side. A - Arms. Signs are weakness or numbness in an arm. This happens suddenly and usually on one side of the body. S - Speech.Signs are sudden trouble speaking, slurred speech, or trouble understanding what people say. T - Time. Time to call emergency services. Write down what time symptoms started. You have other signs of a stroke, such as: A sudden, severe headache with no known cause. Nausea or vomiting. Seizure. These symptoms may be an emergency. Get help right away. Call 911. Do not wait  to see if the symptoms will go away. Do not drive yourself to the hospital. This information is not intended to replace advice given to you by your health care provider. Make sure you discuss any questions you have with your health care provider. Document Revised: 12/07/2021 Document Reviewed: 12/07/2021 Elsevier Patient Education  2024 Elsevier Inc. Vitamin B12 Deficiency? Vitamin B12 deficiency means that your body does not have enough vitamin B12. The body needs this important vitamin: To make red blood cells. To make genes (DNA). To help the nerves work. If you do not have enough vitamin B12 in your body, you can have health problems, such as not having enough red blood cells in the blood (anemia). What are the causes? Not eating enough foods that contain vitamin B12. Not being able to take in (absorb) vitamin B12 from the food that you eat. Certain diseases. A condition in which the body does not make enough of a certain protein. This results in your body not taking in enough vitamin B12. Having a surgery in which part of the stomach or small intestine is taken out. Taking medicines that make  it hard for the body to take in vitamin B12. These include: Heartburn medicines. Some medicines that are used to treat diabetes. What increases the risk? Being an older adult. Eating a vegetarian or vegan diet that does not include any foods that come from animals. Not eating enough foods that contain vitamin B12 while you are pregnant. Taking certain medicines. Having alcoholism. What are the signs or symptoms? In some cases, there are no symptoms. If the condition leads to too few blood cells or nerve damage, symptoms can occur, such as: Feeling weak or tired. Not being hungry. Losing feeling (numbness) or tingling in your hands and feet. Redness and burning of the tongue. Feeling sad (depressed). Confusion or memory problems. Trouble walking. If anemia is very bad, symptoms can  include: Being short of breath. Being dizzy. Having a very fast heartbeat. How is this treated? Changing the way you eat and drink, such as: Eating more foods that contain vitamin B12. Drinking little or no alcohol. Getting vitamin B12 shots. Taking vitamin B12 supplements by mouth (orally). Your doctor will tell you the dose that is best for you. Follow these instructions at home: Eating and drinking  Eat foods that come from animals and have a lot of vitamin B12 in them. These include: Meats and poultry. This includes beef, pork, chicken, [place], and organ meats, such as liver. Seafood, such as clams, rainbow trout, salmon, tuna, and haddock. Eggs. Dairy foods such as milk, yogurt, and cheese. Eat breakfast cereals that have vitamin B12 added to them (are fortified). Check the label. The items listed above may not be a complete list of foods and beverages you can eat and drink. Contact a dietitian for more information. Alcohol use Do not drink alcohol if: Your doctor tells you not to drink. You are pregnant, may be pregnant, or are planning to become pregnant. If you drink alcohol: Limit how much you have to: 0-1 drink a day for women. 0-2 drinks a day for men. Know how much alcohol is in your drink. In the [place] one drink equals one 12 oz bottle of beer (355 mL), one 5 oz glass of wine (148 mL), or one 1 oz glass of hard liquor (44 mL). General instructions Get any vitamin B12 shots if told by your doctor. Take supplements only as told by your doctor. Follow the directions. Keep all follow-up visits. Contact a doctor if: Your symptoms come back. Your symptoms get worse or do not get better with treatment. Get help right away if: You have trouble breathing. You have a very fast heartbeat. You have chest pain. You get dizzy. You faint. These symptoms may be an emergency. Get help right away. Call 911. Do not wait to see if the symptoms will go away. Do not drive yourself  to the hospital. Summary Vitamin B12 deficiency means that your body is not getting enough of the vitamin. In some cases, there are no symptoms of this condition. Treatment may include making a change in the way you eat and drink, getting shots, or taking supplements. Eat foods that have vitamin B12 in them. This information is not intended to replace advice given to you by your health care provider. Make sure you discuss any questions you have with your health care provider. Document Revised: 02/16/2021 Document Reviewed: 02/16/2021 Elsevier Patient Education  2024 [employer].

## 2024-03-22 ENCOUNTER — Ambulatory Visit: Payer: Self-pay | Admitting: Neurology

## 2024-03-22 ENCOUNTER — Encounter: Payer: Self-pay | Admitting: Neurology

## 2024-03-22 LAB — CBC WITH DIFFERENTIAL/PLATELET
Basophils Absolute: 0.1 x10E3/uL (ref 0.0–0.2)
Basos: 1 %
EOS (ABSOLUTE): 0.1 x10E3/uL (ref 0.0–0.4)
Eos: 1 %
Hematocrit: 41 % (ref 34.0–46.6)
Hemoglobin: 13.7 g/dL (ref 11.1–15.9)
Immature Grans (Abs): 0 x10E3/uL (ref 0.0–0.1)
Immature Granulocytes: 0 %
Lymphocytes Absolute: 2.1 x10E3/uL (ref 0.7–3.1)
Lymphs: 21 %
MCH: 33.1 pg — ABNORMAL HIGH (ref 26.6–33.0)
MCHC: 33.4 g/dL (ref 31.5–35.7)
MCV: 99 fL — ABNORMAL HIGH (ref 79–97)
Monocytes Absolute: 0.5 x10E3/uL (ref 0.1–0.9)
Monocytes: 5 %
Neutrophils Absolute: 7.5 x10E3/uL — ABNORMAL HIGH (ref 1.4–7.0)
Neutrophils: 72 %
Platelets: 187 x10E3/uL (ref 150–450)
RBC: 4.14 x10E6/uL (ref 3.77–5.28)
RDW: 12.1 % (ref 11.7–15.4)
WBC: 10.2 x10E3/uL (ref 3.4–10.8)

## 2024-03-22 LAB — VITAMIN B6: Vitamin B6: 6.4 ug/L (ref 3.4–65.2)

## 2024-03-22 LAB — COMPREHENSIVE METABOLIC PANEL WITH GFR
ALT: 31 IU/L (ref 0–32)
AST: 21 IU/L (ref 0–40)
Albumin: 4.2 g/dL (ref 3.9–4.9)
Alkaline Phosphatase: 63 IU/L (ref 44–121)
BUN/Creatinine Ratio: 15 (ref 9–23)
BUN: 11 mg/dL (ref 6–24)
Bilirubin Total: 0.3 mg/dL (ref 0.0–1.2)
CO2: 21 mmol/L (ref 20–29)
Calcium: 8.8 mg/dL (ref 8.7–10.2)
Chloride: 107 mmol/L — ABNORMAL HIGH (ref 96–106)
Creatinine, Ser: 0.75 mg/dL (ref 0.57–1.00)
Globulin, Total: 2.3 g/dL (ref 1.5–4.5)
Glucose: 74 mg/dL (ref 70–99)
Potassium: 3.7 mmol/L (ref 3.5–5.2)
Sodium: 140 mmol/L (ref 134–144)
Total Protein: 6.5 g/dL (ref 6.0–8.5)
eGFR: 99 mL/min/1.73 (ref >59)

## 2024-03-22 LAB — LIPID PANEL
Chol/HDL Ratio: 3.1 ratio (ref 0.0–4.4)
Cholesterol, Total: 148 mg/dL (ref 100–199)
HDL: 48 mg/dL (ref >39)
LDL Chol Calc (NIH): 80 mg/dL (ref 0–99)
Triglycerides: 112 mg/dL (ref 0–149)
VLDL Cholesterol Cal: 20 mg/dL (ref 5–40)

## 2024-03-22 LAB — TSH RFX ON ABNORMAL TO FREE T4: TSH: 1.1 u[IU]/mL (ref 0.450–4.500)

## 2024-03-22 LAB — MULTIPLE MYELOMA PANEL, SERUM
Albumin SerPl Elph-Mcnc: 3.7 g/dL (ref 2.9–4.4)
Albumin/Glob SerPl: 1.4 (ref 0.7–1.7)
Alpha 1: 0.2 g/dL (ref 0.0–0.4)
Alpha2 Glob SerPl Elph-Mcnc: 0.7 g/dL (ref 0.4–1.0)
B-Globulin SerPl Elph-Mcnc: 1 g/dL (ref 0.7–1.3)
Gamma Glob SerPl Elph-Mcnc: 0.9 g/dL (ref 0.4–1.8)
Globulin, Total: 2.8 g/dL (ref 2.2–3.9)
IgA/Immunoglobulin A, Serum: 174 mg/dL (ref 87–352)
IgG (Immunoglobin G), Serum: 958 mg/dL (ref 586–1602)
IgM (Immunoglobulin M), Srm: 78 mg/dL (ref 26–217)

## 2024-03-22 LAB — FERRITIN: Ferritin: 254 ng/mL — ABNORMAL HIGH (ref 15–150)

## 2024-03-22 LAB — HEMOGLOBIN A1C
Est. average glucose Bld gHb Est-mCnc: 103 mg/dL
Hgb A1c MFr Bld: 5.2 % (ref 4.8–5.6)

## 2024-03-22 LAB — B12 AND FOLATE PANEL
Folate: 7 ng/mL (ref >3.0)
Vitamin B-12: 251 pg/mL (ref 232–1245)

## 2024-03-22 LAB — VITAMIN B1: Thiamine: 145.3 nmol/L (ref 66.5–200.0)

## 2024-03-22 LAB — VITAMIN D 25 HYDROXY (VIT D DEFICIENCY, FRACTURES): Vit D, 25-Hydroxy: 41.6 ng/mL (ref 30.0–100.0)

## 2024-03-22 LAB — METHYLMALONIC ACID, SERUM: Methylmalonic Acid: 399 nmol/L — ABNORMAL HIGH (ref 0–378)

## 2024-03-30 ENCOUNTER — Encounter (HOSPITAL_BASED_OUTPATIENT_CLINIC_OR_DEPARTMENT_OTHER): Payer: Self-pay | Admitting: Cardiology

## 2024-03-30 ENCOUNTER — Ambulatory Visit (HOSPITAL_BASED_OUTPATIENT_CLINIC_OR_DEPARTMENT_OTHER): Admitting: Cardiology

## 2024-03-30 VITALS — BP 108/70 | HR 83 | Resp 17 | Ht 69.0 in | Wt 258.0 lb

## 2024-03-30 DIAGNOSIS — E66812 Obesity, class 2: Secondary | ICD-10-CM | POA: Diagnosis not present

## 2024-03-30 DIAGNOSIS — R197 Diarrhea, unspecified: Secondary | ICD-10-CM

## 2024-03-30 DIAGNOSIS — R110 Nausea: Secondary | ICD-10-CM

## 2024-03-30 DIAGNOSIS — Z6838 Body mass index (BMI) 38.0-38.9, adult: Secondary | ICD-10-CM

## 2024-03-30 DIAGNOSIS — Z8673 Personal history of transient ischemic attack (TIA), and cerebral infarction without residual deficits: Secondary | ICD-10-CM

## 2024-03-30 DIAGNOSIS — Z7189 Other specified counseling: Secondary | ICD-10-CM

## 2024-03-30 NOTE — Patient Instructions (Signed)

## 2024-03-30 NOTE — Progress Notes (Signed)
 Cardiology Office Note:  .    Date:  03/30/2024  ID:  Jaime Massey, DOB 1976/01/03, MRN [id number] PCP: Jaime Guy, DO  [facility] Providers Cardiologist:  Jaime Bruckner, MD     History of Present Illness: .    Jaime Massey is a 48 y.o. female with a hx of CVA treated with TNK 06/2022, who is seen for follow-up today. She was initially seen 11/25/2022 as a new consult for the evaluation and management of history of CVA, request for TEE.   History: -CVA 06/2022, [facility]: facial droop, numbness, tongue deviation. Treated with TNK with resolution of symptoms. Workup including CT angiogram, MRI, echo, A1c, cholesterol, event monitor, and transcranial dopplers was unremarkable.  -persistent left hand/left foot paresthesias -TEE 12/03/2022 which revealed tiny degenerative strands on the atrial surface of the mitral valve. 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 This likely has a lateral attachment point, P1 scallop. The mitral valve is otherwise grossly normal. Mild mitral valve regurgitation. No evidence of mitral stenosis. LVEF 50%. Trivial aortic regurgitation. Agitated saline contrast bubble study was negative, with no evidence of any interatrial shunt.    Tobacco use history: former, quit 2019 Family history: grandfather had first MI age 45, had several more, passed away late 50s/early 41s. Father has an aortic aneurysm, has been monitored for the last 14 years without growing. No other known heart disease. Great aunt had a stroke.   Today: Went back to neurologist due to persistent numbness, B12 was low, has started on supplementation.  Stopped Wegovy  at the 1.7 mg weekly dose several months ago. Wasn't able to cut back on soda, but she did eat less food. Had nausea but this did not improve with stopping Wegovy . Has gained weight since stopping. Has  noticed more yellow/bile diarrhea in the last few months. This is off GLP. She alternated constipation and diarrhea while on the GLP.   Chest pain is unchanged, sharp, brief, no clear triggers.  Denies shortness of breath at rest or with normal exertion. No PND, orthopnea, LE edema. No syncope or palpitations. ROS otherwise negative except as noted.   ROS:  Please see the history of present illness. ROS otherwise negative except as noted.   Studies Reviewed: Jaime Massey    EKG Interpretation Date/Time:  Tuesday March 30 2024 15:13:25 EDT Ventricular Rate:  83 PR Interval:  172 QRS Duration:  82 QT Interval:  398 QTC Calculation: 467 R Axis:   7  Text Interpretation: Normal sinus rhythm Normal ECG Confirmed by Massey Jaime [phone]) on 03/30/2024 4:09:24 PM    Physical Exam:    VS:  BP 108/70 (BP Location: Left Arm, Patient Position: Sitting, Cuff Size: Large)   Pulse 83   Resp 17   Ht 5' 9 (1.753 m)   Wt 258 lb (117 kg)   SpO2 97%   BMI 38.10 kg/m    Wt Readings from Last 3 Encounters:  03/30/24 258 lb (117 kg)  03/15/24 254 lb 9.6 oz (115.5 kg)  02/05/24 241 lb (109.3 kg)    GEN: Well nourished, well developed in no acute distress HEENT: Normal, moist mucous membranes NECK: No JVD CARDIAC: regular rhythm, normal S1 and S2, no rubs or gallops. No murmur. VASCULAR: Radial and DP pulses 2+ bilaterally. No carotid bruits RESPIRATORY:  Clear to auscultation without rales, wheezing or rhonchi  ABDOMEN: Soft, non-tender, non-distended MUSCULOSKELETAL:  Ambulates independently SKIN: Warm and dry, no edema NEUROLOGIC:  Alert and oriented x 3. No focal neuro deficits noted. PSYCHIATRIC:  Normal affect    ASSESSMENT AND PLAN: .    Chest pain, atypical -sharp, nonexertional, helped by PPI/tums, supports GI -reviewed red flag warning signs that need immediate medical attention  CVA -06/2022, treated with TNK -negative workup to date -TEE 11/2022 reviewed, mitral leaflet  fibrinous strands noted. We discussed rechecking the size of the fibrinous strands by TEE; discussed pros/cons of this. Discussed that the potential outcomes could be that the strands are gone; that the strands are the same; or that they are worse. Discussed if they are worse/concerning for embolic risk, options would be to consider open heart surgery, which comes with risk as well. -on aspirin , tolerating -was on atorvastatin for a week after her stroke, stopped at her neurology visit post hospitalization. Her LDL is now 80, goal is <70. We discussed restarting statin    ASCVD history CVA -was on Wegovy  to decrease future CVD risk given prior ASCVD.  -Starting weight 244 lbs, current 258 lbs -now with worsening diarrhea/nausea, does not have gallbladder, has been off GLP for months -would recommend GI evaluation before retrial of GLP. Recommend she discuss with her PCP (she is pending a new PCP) to discuss   Cardiac risk counseling and prevention recommendations: -recommend heart healthy/Mediterranean diet, with whole grains, fruits, vegetable, fish, lean meats, nuts, and olive oil. Limit salt. -recommend moderate walking, 3-5 times/week for 30-50 minutes each session. Aim for at least 150 minutes.week. Goal should be pace of 3 miles/hours, or walking 1.5 miles in 30 minutes -recommend avoidance of tobacco products. Avoid excess alcohol.  Dispo: Follow-up in 6 months, or sooner as needed. In the interim, she will meet with new PCP and discuss GI referral. If GI symptoms improve, would re-trial Wegovy .  Signed, Jaime Bruckner, MD

## 2024-03-31 ENCOUNTER — Encounter: Payer: Self-pay | Admitting: Podiatry

## 2024-03-31 ENCOUNTER — Other Ambulatory Visit: Payer: Self-pay | Admitting: Orthopedic Surgery

## 2024-03-31 ENCOUNTER — Ambulatory Visit: Admitting: Podiatry

## 2024-03-31 VITALS — Ht 69.0 in | Wt 258.0 lb

## 2024-03-31 DIAGNOSIS — M5416 Radiculopathy, lumbar region: Secondary | ICD-10-CM

## 2024-03-31 DIAGNOSIS — G609 Hereditary and idiopathic neuropathy, unspecified: Secondary | ICD-10-CM | POA: Diagnosis not present

## 2024-03-31 DIAGNOSIS — G5791 Unspecified mononeuropathy of right lower limb: Secondary | ICD-10-CM

## 2024-03-31 NOTE — Progress Notes (Unsigned)
   Chief Complaint  Patient presents with   Nail Problem    Pt is here to f/u on toenail fungus, stop taking medication states here toenails has cleared up currently has nail polish on toenails.    Subjective: 48 y.o. female presenting today for evaluation of pain and tenderness associated to thick discolored toenails ongoing for several years.  She has tried topical antifungal medications with no improvement.  Past Medical History:  Diagnosis Date   Anxiety    Arthritis    Asthma due to environmental allergies    pt states she has never had asthma problems    Depression    DVT (deep venous thrombosis) (HCC)    post c-section   Migraine    PE (pulmonary embolism)    post c-section   Seizures (HCC)    as child from severe ear infections- no meds now and no seizures since age 58   Warfarin anticoagulation    took from 03/2010-12/2010.     Past Surgical History:  Procedure Laterality Date   CESAREAN SECTION     CHOLECYSTECTOMY N/A 12/21/2012   Procedure: LAPAROSCOPIC CHOLECYSTECTOMY;  Surgeon: Thresa JAYSON Pulling, MD;  Location: [facility];  Service: General;  Laterality: N/A;   KNEE ARTHROSCOPY WITH MEDIAL MENISECTOMY Right 03/05/2017   Procedure: chondroplasty patella, medial femoral condyle;  Surgeon: Margrette Taft BRAVO, MD;  Location: [facility];  Service: Orthopedics;  Laterality: Right;   TEE WITHOUT CARDIOVERSION N/A 12/03/2022   Procedure: TRANSESOPHAGEAL ECHOCARDIOGRAM;  Surgeon: Loni Soyla LABOR, MD;  Location: [facility] INVASIVE CV LAB;  Service: Cardiovascular;  Laterality: N/A;    Allergies  Allergen Reactions   Amoxicillin Hives   Topiramate      Caused mood swing   Penicillins Rash    Objective: Physical Exam General: The patient is alert and oriented x3 in no acute distress.  Dermatology: Hyperkeratotic, discolored, thickened, onychodystrophy noted. Skin is warm, dry and supple bilateral lower extremities. Negative for open lesions or macerations.  Vascular: Palpable pedal  pulses bilaterally. No edema or erythema noted. Capillary refill within normal limits.  Neurological: Grossly intact via light touch  Musculoskeletal Exam: No pedal deformity noted  Assessment: #1 Onychomycosis of toenails bilateral  Plan of Care:  -Patient was evaluated. -Mechanical debridement of nails 1-5 was performed using a nail nipper without incident or bleeding -No biopsy was also obtained of the left hallux nail plate.  There is no biopsy was sent to pathology for fungal culture -Today we discussed different treatment options including oral, topical, and laser antifungal treatment modalities.  We discussed their efficacies and side effects.  Patient opts for oral antifungal treatment modality -Prescription for Lamisil  250 mg #90 daily. Pt denies a history of liver pathology or symptoms.  CMP 05/20/2023 WNL -Return to clinic 6 months   Thresa EMERSON Sar, DPM [facility]  Dr. Thresa EMERSON Sar, DPM    [address] [place], [place] [address]                Office [phone]  Fax [phone]

## 2024-04-07 ENCOUNTER — Encounter: Payer: Self-pay | Admitting: Neurology

## 2024-04-13 ENCOUNTER — Other Ambulatory Visit: Payer: Self-pay | Admitting: Podiatry

## 2024-04-13 ENCOUNTER — Encounter: Payer: Self-pay | Admitting: Podiatry

## 2024-04-14 ENCOUNTER — Ambulatory Visit: Payer: Self-pay | Admitting: Podiatry

## 2024-04-22 ENCOUNTER — Encounter: Payer: Self-pay | Admitting: Student in an Organized Health Care Education/Training Program

## 2024-04-22 ENCOUNTER — Ambulatory Visit
Attending: Student in an Organized Health Care Education/Training Program | Admitting: Student in an Organized Health Care Education/Training Program

## 2024-04-22 VITALS — BP 143/74 | HR 66 | Temp 98.2°F | Resp 16 | Ht 69.0 in | Wt 258.0 lb

## 2024-04-22 DIAGNOSIS — M5416 Radiculopathy, lumbar region: Secondary | ICD-10-CM | POA: Insufficient documentation

## 2024-04-22 DIAGNOSIS — G894 Chronic pain syndrome: Secondary | ICD-10-CM | POA: Insufficient documentation

## 2024-04-22 DIAGNOSIS — I69398 Other sequelae of cerebral infarction: Secondary | ICD-10-CM | POA: Insufficient documentation

## 2024-04-22 DIAGNOSIS — M792 Neuralgia and neuritis, unspecified: Secondary | ICD-10-CM | POA: Diagnosis present

## 2024-04-22 DIAGNOSIS — G8929 Other chronic pain: Secondary | ICD-10-CM | POA: Insufficient documentation

## 2024-04-22 MED ORDER — JOURNAVX 50 MG PO TABS: 50.0000 mg | ORAL_TABLET | Freq: Two times a day (BID) | ORAL | 1 refills | Status: AC | PRN

## 2024-04-22 NOTE — Patient Instructions (Addendum)
 Discuss with your PCP Cymbalta at 20 or 30 mg to start of with. He may need to decrease or discontinue Celexa if you try Cymbalta.  Rx for Journavax  Moderate Conscious Sedation, Adult Sedation is the use of medicines to help you relax and not feel pain. Moderate conscious sedation is a type of sedation that makes you less alert than normal. You are still able to respond to instructions, touch, or both. This type of sedation is used during short medical and dental procedures. It is milder than deep sedation, which is a type of sedation you cannot be easily woken up from. It is also milder than general anesthesia, which is the use of medicines to make you fall asleep. Moderate conscious sedation lets you return to your normal activities sooner. Tell a health care provider about: Any allergies you have. All medicines you are taking, including vitamins, herbs, steroids, eye drops, creams, and over-the-counter medicines. Any problems you or family members have had with anesthesia. Any bleeding problems you have. Any surgeries you have had. Any medical conditions you have. Whether you are pregnant or may be pregnant. Any recent alcohol, tobacco, or drug use. What are the risks? Your health care provider will talk with you about risks. These may include: Oversedation. This is when you get too much medicine. Nausea or vomiting. Allergic reaction to medicines. Trouble breathing. If this happens, a breathing tube may be used. It will be removed when you can breathe better on your own. Heart trouble. Lung trouble. Emergence delirium. This is when you feel confused while the sedation wears off. This gets better with time. What happens before the procedure? When to stop eating and drinking Follow instructions from your health care provider about what you may eat and drink. These may include: 8 hours before your procedure Stop eating most foods. Do not eat meat, fried foods, or fatty foods. Eat only  light foods, such as toast or crackers. All liquids are okay except energy drinks and alcohol. 6 hours before your procedure Stop eating. Drink only clear liquids, such as water, clear fruit juice, black coffee, plain tea, and sports drinks. Do not drink energy drinks or alcohol. 2 hours before your procedure Stop drinking all liquids. You may be allowed to take medicines with small sips of water. If you do not follow your health care provider's instructions, your procedure may be delayed or canceled. Medicines Ask your health care provider about: Changing or stopping your regular medicines. These include any diabetes medicines or blood thinners you take. Taking medicines such as aspirin  and ibuprofen . These medicines can thin your blood. Do not take them unless your health care provider tells you to. Taking over-the-counter medicines, vitamins, herbs, and supplements. Tests and exams You may have an exam or testing. You may have a blood or urine sample taken. General instructions Do not use any products that contain nicotine or tobacco for at least 4 weeks before the procedure. These products include cigarettes, chewing tobacco, and vaping devices, such as e-cigarettes. If you need help quitting, ask your health care provider. If you will be going home right after the procedure, plan to have a responsible adult: Take you home from the hospital or clinic. You will not be allowed to drive. Care for you for the time you are told. What happens during the procedure?  You will be given the sedative. It may be given: As a pill you can take by mouth. It can also be put into the rectum.  As a spray through the nose. As an injection into muscle. As an injection into a vein through an IV. You may be given oxygen as needed. Your blood pressure, heart rate, breathing rate, and blood oxygen level will be monitored during the procedure. The medical or dental procedure will be done. The procedure  may vary among health care providers and hospitals. What happens after the procedure? Your blood pressure, heart rate, breathing rate, and blood oxygen level will be monitored until you leave the hospital or clinic. You will get fluids through an IV as needed. Do not drive or operate machinery until your health care provider says that it is safe. This information is not intended to replace advice given to you by your health care provider. Make sure you discuss any questions you have with your health care provider. Document Revised: 01/07/2022 Document Reviewed: 01/07/2022 Elsevier Patient Education  2024 [employer]GENERAL RISKS AND COMPLICATIONS  What are the risk, side effects and possible complications? Generally speaking, most procedures are safe.  However, with any procedure there are risks, side effects, and the possibility of complications.  The risks and complications are dependent upon the sites that are lesioned, or the type of nerve block to be performed.  The closer the procedure is to the spine, the more serious the risks are.  Great care is taken when placing the radio frequency needles, block needles or lesioning probes, but sometimes complications can occur. Infection: Any time there is an injection through the skin, there is a risk of infection.  This is why sterile conditions are used for these blocks.  There are four possible types of infection. Localized skin infection. Central Nervous System Infection-This can be in the form of Meningitis, which can be deadly. Epidural Infections-This can be in the form of an epidural abscess, which can cause pressure inside of the spine, causing compression of the spinal cord with subsequent paralysis. This would require an emergency surgery to decompress, and there are no guarantees that the patient would recover from the paralysis. Discitis-This is an infection of the intervertebral discs.  It occurs in about 1% of discography procedures.   It is difficult to treat and it may lead to surgery.        2. Pain: the needles have to go through skin and soft tissues, will cause soreness.       3. Damage to internal structures:  The nerves to be lesioned may be near blood vessels or    other nerves which can be potentially damaged.       4. Bleeding: Bleeding is more common if the patient is taking blood thinners such as  aspirin , Coumadin, Ticiid, Plavix, etc., or if he/she have some genetic predisposition  such as hemophilia. Bleeding into the spinal canal can cause compression of the spinal  cord with subsequent paralysis.  This would require an emergency surgery to  decompress and there are no guarantees that the patient would recover from the  paralysis.       5. Pneumothorax:  Puncturing of a lung is a possibility, every time a needle is introduced in  the area of the chest or upper back.  Pneumothorax refers to free air around the  collapsed lung(s), inside of the thoracic cavity (chest cavity).  Another two possible  complications related to a similar event would include: Hemothorax and Chylothorax.   These are variations of the Pneumothorax, where instead of air around the collapsed  lung(s), you may have blood  or chyle, respectively.       6. Spinal headaches: They may occur with any procedures in the area of the spine.       7. Persistent CSF (Cerebro-Spinal Fluid) leakage: This is a rare problem, but may occur  with prolonged intrathecal or epidural catheters either due to the formation of a fistulous  track or a dural tear.       8. Nerve damage: By working so close to the spinal cord, there is always a possibility of  nerve damage, which could be as serious as a permanent spinal cord injury with  paralysis.       9. Death:  Although rare, severe deadly allergic reactions known as Anaphylactic  reaction can occur to any of the medications used.      10. Worsening of the symptoms:  We can always make thing worse.  What are the  chances of something like this happening? Chances of any of this occuring are extremely low.  By statistics, you have more of a chance of getting killed in a motor vehicle accident: while driving to the hospital than any of the above occurring .  Nevertheless, you should be aware that they are possibilities.  In general, it is similar to taking a shower.  Everybody knows that you can slip, hit your head and get killed.  Does that mean that you should not shower again?  Nevertheless always keep in mind that statistics do not mean anything if you happen to be on the wrong side of them.  Even if a procedure has a 1 (one) in a 1,000,000 (million) chance of going wrong, it you happen to be that one..Also, keep in mind that by statistics, you have more of a chance of having something go wrong when taking medications.  Who should not have this procedure? If you are on a blood thinning medication (e.g. Coumadin, Plavix, see list of Blood Thinners), or if you have an active infection going on, you should not have the procedure.  If you are taking any blood thinners, please inform your physician.  How should I prepare for this procedure? Do not eat or drink anything at least six hours prior to the procedure. Bring a driver with you .  It cannot be a taxi. Come accompanied by an adult that can drive you back, and that is strong enough to help you if your legs get weak or numb from the local anesthetic. Take all of your medicines the morning of the procedure with just enough water to swallow them. If you have diabetes, make sure that you are scheduled to have your procedure done first thing in the morning, whenever possible. If you have diabetes, take only half of your insulin dose and notify our nurse that you have done so as soon as you arrive at the clinic. If you are diabetic, but only take blood sugar pills (oral hypoglycemic), then do not take them on the morning of your procedure.  You may take them after  you have had the procedure. Do not take aspirin  or any aspirin -containing medications, at least eleven (11) days prior to the procedure.  They may prolong bleeding. Wear loose fitting clothing that may be easy to take off and that you would not mind if it got stained with Betadine or blood. Do not wear any jewelry or perfume Remove any nail coloring.  It will interfere with some of our monitoring equipment.  NOTE: Remember that this is not meant  to be interpreted as a complete list of all possible complications.  Unforeseen problems may occur.  BLOOD THINNERS The following drugs contain aspirin  or other products, which can cause increased bleeding during surgery and should not be taken for 2 weeks prior to and 1 week after surgery.  If you should need take something for relief of minor pain, you may take acetaminophen  which is found in Tylenol ,m Datril, Anacin-3 and Panadol. It is not blood thinner. The products listed below are.  Do not take any of the products listed below in addition to any listed on your instruction sheet.  A.P.C or A.P.C with Codeine  Codeine  Phosphate Capsules #3 Ibuprofen  Ridaura  ABC compound Congesprin Imuran rimadil  Advil  Cope Indocin Robaxisal  Alka-Seltzer Effervescent Pain Reliever and Antacid Coricidin or Coricidin-D  Indomethacin Rufen   Alka-Seltzer plus Cold Medicine Cosprin Ketoprofen S-A-C Tablets  Anacin Analgesic Tablets or Capsules Coumadin Korlgesic Salflex  Anacin Extra Strength Analgesic tablets or capsules CP-2 Tablets Lanoril Salicylate  Anaprox  Cuprimine Capsules Levenox Salocol  Anexsia-D Dalteparin Magan Salsalate  Anodynos Darvon compound Magnesium Salicylate Sine-off  Ansaid Dasin Capsules Magsal Sodium Salicylate  Anturane Depen Capsules Marnal Soma  APF Arthritis pain formula Dewitt's Pills Measurin  Stanback  Argesic Dia-Gesic Meclofenamic Sulfinpyrazone  Arthritis Bayer Timed Release Aspirin  Diclofenac  Meclomen Sulindac  Arthritis pain  formula Anacin Dicumarol Medipren  Supac  Analgesic (Safety coated) Arthralgen Diffunasal Mefanamic Suprofen  Arthritis Strength Bufferin Dihydrocodeine Mepro Compound Suprol  Arthropan liquid Dopirydamole Methcarbomol with Aspirin  Synalgos  ASA tablets/Enseals Disalcid Micrainin Tagament  Ascriptin Doan's Midol  Talwin  Ascriptin A/D Dolene Mobidin Tanderil  Ascriptin Extra Strength Dolobid Moblgesic Ticlid  Ascriptin with Codeine  Doloprin or Doloprin with Codeine  Momentum Tolectin  Asperbuf Duoprin Mono-gesic Trendar  Aspergum Duradyne Motrin  or Motrin  IB Triminicin  Aspirin  plain, buffered or enteric coated Durasal Myochrisine Trigesic  Aspirin  Suppositories Easprin Nalfon Trillsate  Aspirin  with Codeine  Ecotrin Regular or Extra Strength Naprosyn  Uracel  Atromid-S Efficin Naproxen  Ursinus  Auranofin Capsules Elmiron Neocylate Vanquish  Axotal Emagrin Norgesic Verin  Azathioprine Empirin or Empirin with Codeine  Normiflo Vitamin E  Azolid Emprazil Nuprin  Voltaren   Bayer Aspirin  plain, buffered or children's or timed BC Tablets or powders Encaprin Orgaran Warfarin Sodium  Buff-a-Comp Enoxaparin Orudis Zorpin  Buff-a-Comp with Codeine  Equegesic Os-Cal-Gesic   Buffaprin Excedrin plain, buffered or Extra Strength Oxalid   Bufferin Arthritis Strength Feldene Oxphenbutazone   Bufferin plain or Extra Strength Feldene Capsules Oxycodone  with Aspirin    Bufferin with Codeine  Fenoprofen Fenoprofen Pabalate or Pabalate-SF   Buffets II Flogesic Panagesic   Buffinol plain or Extra Strength Florinal or Florinal with Codeine  Panwarfarin   Buf-Tabs Flurbiprofen Penicillamine   Butalbital Compound Four-way cold tablets Penicillin   Butazolidin Fragmin Pepto-Bismol   Carbenicillin Geminisyn Percodan   Carna Arthritis Reliever Geopen Persantine   Carprofen Gold's salt Persistin   Chloramphenicol Goody's Phenylbutazone   Chloromycetin Haltrain Piroxlcam   Clmetidine heparin Plaquenil   Cllnoril  Hyco-pap Ponstel   Clofibrate Hydroxy chloroquine Propoxyphen         Before stopping any of these medications, be sure to consult the physician who ordered them.  Some, such as Coumadin (Warfarin) are ordered to prevent or treat serious conditions such as deep thrombosis, pumonary embolisms, and other heart problems.  The amount of time that you may need off of the medication may also vary with the medication and the reason for which you were taking it.  If you are taking any of these medications, please make sure you notify  your pain physician before you undergo any procedures.         Epidural Steroid Injection Patient Information  Description: The epidural space surrounds the nerves as they exit the spinal cord.  In some patients, the nerves can be compressed and inflamed by a bulging disc or a tight spinal canal (spinal stenosis).  By injecting steroids into the epidural space, we can bring irritated nerves into direct contact with a potentially helpful medication.  These steroids act directly on the irritated nerves and can reduce swelling and inflammation which often leads to decreased pain.  Epidural steroids may be injected anywhere along the spine and from the neck to the low back depending upon the location of your pain.   After numbing the skin with local anesthetic (like Novocaine), a small needle is passed into the epidural space slowly.  You may experience a sensation of pressure while this is being done.  The entire block usually last less than 10 minutes.  Conditions which may be treated by epidural steroids:  Low back and leg pain Neck and arm pain Spinal stenosis Post-laminectomy syndrome Herpes zoster (shingles) pain Pain from compression fractures  Preparation for the injection:  Do not eat any solid food or dairy products within 8 hours of your appointment.  You may drink clear liquids up to 3 hours before appointment.  Clear liquids include water, black  coffee, juice or soda.  No milk or cream please. You may take your regular medication, including pain medications, with a sip of water before your appointment  Diabetics should hold regular insulin (if taken separately) and take 1/2 normal NPH dos the morning of the procedure.  Carry some sugar containing items with you to your appointment. A driver must accompany you and be prepared to drive you home after your procedure.  Bring all your current medications with your. An IV may be inserted and sedation may be given at the discretion of the physician.   A blood pressure cuff, EKG and other monitors will often be applied during the procedure.  Some patients may need to have extra oxygen administered for a short period. You will be asked to provide medical information, including your allergies, prior to the procedure.  We must know immediately if you are taking blood thinners (like Coumadin/Warfarin)  Or if you are allergic to IV iodine contrast (dye). We must know if you could possible be pregnant.  Possible side-effects: Bleeding from needle site Infection (rare, may require surgery) Nerve injury (rare) Numbness & tingling (temporary) Difficulty urinating (rare, temporary) Spinal headache ( a headache worse with upright posture) Light -headedness (temporary) Pain at injection site (several days) Decreased blood pressure (temporary) Weakness in arm/leg (temporary) Pressure sensation in back/neck (temporary)  Call if you experience: Fever/chills associated with headache or increased back/neck pain. Headache worsened by an upright position. New onset weakness or numbness of an extremity below the injection site Hives or difficulty breathing (go to the emergency room) Inflammation or drainage at the infection site Severe back/neck pain Any new symptoms which are concerning to you  Please note:  Although the local anesthetic injected can often make your back or neck feel good for several  hours after the injection, the pain will likely return.  It takes 3-7 days for steroids to work in the epidural space.  You may not notice any pain relief for at least that one week.  If effective, we will often do a series of three injections spaced 3-6 weeks apart  to maximally decrease your pain.  After the initial series, we generally will wait several months before considering a repeat injection of the same type.  If you have any questions, please call [phone] [facility]

## 2024-04-22 NOTE — Progress Notes (Signed)
 Safety precautions to be maintained throughout the outpatient stay will include: orient to surroundings, keep bed in low position, maintain call bell within reach at all times, provide assistance with transfer out of bed and ambulation.

## 2024-04-22 NOTE — Progress Notes (Signed)
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   TEE WITHOUT CARDIOVERSION N/A 12/03/2022   Procedure: TRANSESOPHAGEAL ECHOCARDIOGRAM;  Surgeon: Loni Soyla LABOR, MD;  Location: [facility] INVASIVE CV [facility];  Service: Cardiovascular;  Laterality: N/A;   Active Ambulatory Problems    Diagnosis Date Noted   Fever of unknown origin 10/01/2016   Bone spur 12/03/2016   Arthritis of knee, right 12/26/2016   Chondromalacia of lateral femoral condyle, right    Chondromalacia of medial condyle of right femur    Chondromalacia patellae of right knee    Rupture of anterior cruciate ligament of right knee    Cyst of anterior horn of lateral meniscus of right knee    S/P knee surgery  patella chondroplasty 03/05/17 02/12/2018   Adjustment disorder with anxious mood 02/15/2017   Bandemia without diagnosis of specific infection 02/15/2017   Obesity 12/19/2017   Pulmonary embolus (HCC) 02/15/2017   FUO (fever of unknown origin) 02/15/2017   Numbness and tingling of right arm and leg 07/31/2020   New daily persistent headache 02/15/2017   Spinal stenosis of cervical region 08/30/2020   Cerebrovascular accident (CVA) (HCC) 06/19/2022   History of pulmonary embolus (PE) 06/19/2022   Chronic migraine without aura without status migrainosus, not intractable 01/15/2023   Mitral leaflet abnormality 02/25/2023   Greater trochanteric pain syndrome 08/01/2023   Chronic radicular lumbar pain 04/22/2024   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 In no apparent acute distress Vitals:   04/22/24 1332  BP: (!) 143/74  Pulse: 66  Resp: 16  Temp: 98.2 F (36.8 C)  TempSrc: Temporal  SpO2: 97%  Weight: 258 lb (117 kg)  Height: 5' 9 (1.753 m)   BMI Assessment: Estimated body mass index is 38.1 kg/m as calculated from the following:   Height as of this encounter: 5' 9 (1.753 m).   Weight as of this encounter: 258 lb (117 kg).  BMI interpretation table: BMI level Category Range association with higher incidence of chronic pain  <18 kg/m2 Underweight   18.5-24.9 kg/m2 Ideal body weight   25-29.9 kg/m2 Overweight Increased incidence by 20%  30-34.9 kg/m2 Obese (Class  I) Increased incidence by 68%  35-39.9 kg/m2 Severe obesity (Class II) Increased incidence by 136%  >40 kg/m2 Extreme obesity (Class III) Increased incidence by 254%   Patient's current BMI Ideal Body weight  Body mass index is 38.1 kg/m. Ideal body weight: 66.2 kg (145 lb 15.1 oz) Adjusted ideal body weight: 86.5 kg (190 lb 12.3 oz)   BMI Readings from Last 4 Encounters:  04/22/24 38.10 kg/m  03/31/24 38.10 kg/m  03/30/24 38.10 kg/m  03/15/24 37.60 kg/m   Wt Readings from Last 4 Encounters:  04/22/24 258 lb (117 kg)  03/31/24 258 lb (117 kg)  03/30/24 258 lb (117 kg)  03/15/24 254 lb 9.6 oz (115.5 kg)    Psych/Mental status: Alert, oriented x 3 (person, place, & time)       Eyes: PERLA Respiratory: No evidence of acute respiratory distress Lumbar Spine Area Exam  Skin & Axial Inspection: No masses, redness, or swelling Alignment: Symmetrical Functional ROM: Pain restricted ROM       Stability: No instability detected Muscle Tone/Strength: Functionally intact. No obvious neuro-muscular anomalies detected. Sensory (Neurological): Dermatomal pain pattern left leg Palpation: No palpable anomalies       Provocative Tests: Hyperextension/rotation test: (+) due to pain. Lumbar quadrant test (Kemp's test): (+) on the left for foraminal stenosis  Gait & Posture Assessment  Ambulation: Unassisted Gait: Relatively normal for age and body habitus Posture: WNL  Lower Extremity Exam    Side: Right lower extremity  Side: Left lower extremity  Stability: No instability observed          Stability: No instability observed          Skin & Extremity Inspection: knee brace in place  Skin & Extremity Inspection: Skin color, temperature, and hair growth are WNL. No peripheral edema or cyanosis. No masses, redness, swelling, asymmetry, or associated skin lesions. No contractures.  Functional ROM: Unrestricted ROM                  Functional ROM: Unrestricted ROM                  Muscle  Tone/Strength: Functionally intact. No obvious neuro-muscular anomalies detected.  Muscle Tone/Strength: Functionally intact. No obvious neuro-muscular anomalies detected.  Sensory (Neurological): Unimpaired        Sensory (Neurological): Unimpaired        DTR: Patellar: deferred today Achilles: deferred today Plantar: deferred today  DTR: Patellar: deferred today Achilles: deferred today Plantar: deferred today  Palpation: No palpable anomalies  Palpation: No palpable anomalies  Assessment  Primary Diagnosis & Pertinent Problem List: The primary encounter diagnosis was Lumbar radiculopathy. 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  Visit Diagnosis (New problems to examiner): 1. Lumbar radiculopathy   2. Chronic radicular lumbar pain   3. 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 Chronic pain syndrome    Plan of Care (Initial workup plan)  Assessment and Plan    Peripheral neuropathy (small fiber neuropathy)   Chronic numbness and tingling in both hands and feet are likely due to small fiber neuropathy, confirmed by a positive small fiber punch biopsy showing microscopic nerve changes. Differential diagnosis also includes post-stroke neuropathy, as there is no history of diabetes or chemotherapy. Symptoms follow a stocking-glove pattern, possibly worsened by central nervous system inflammation post-stroke. Discuss with her PCP the initiation of Cymbalta at 20 or 30 mg to manage neuropathic pain and depression/anxiety, with potential discontinuation of Celexa to avoid serotonin syndrome. Prescribe Jornavax, a sodium channel blocker, for acute pain management.  Left L4-L5 disc protrusion with left lateral recess stenosis (sciatica)   Left L4-L5 disc protrusion with mild facet hypertrophy and left ligamentum flavum  hypertrophy causes moderate left lateral recess stenosis and mild spinal stenosis. Although there is no definitive nerve root compression, the left L5 nerve root is slightly displaced posteriorly. Symptoms include left-sided weakness and sciatica, with back pain exacerbated by standing or certain positions. Schedule an epidural injection for left L4-L5 to target the inflamed L5 nerve, with Valium for sedation during the procedure.  Cerebrovascular accident with residual left-sided weakness   She experienced a stroke in 2023 with residual left-sided weakness. An initial episode in 2020 was possibly a TIA. The stroke may contribute to current neuropathic symptoms in a stocking-glove pattern.  Patient is on aspirin  81 mg  Depression and anxiety   Current treatment includes Celexa and Buspar. Consider Cymbalta for its dual efficacy in treating depression/anxiety and neuropathic pain. There is a risk of serotonin syndrome if combined with Celexa. Discuss with her PCP the potential switch from Celexa to Cymbalta, with appropriate weaning off Celexa.        Procedure Orders         Lumbar Epidural Injection     Pharmacotherapy (current): Medications ordered:  Meds ordered this encounter  Medications   Suzetrigine (JOURNAVX) 50 MG TABS    Sig: Take 50 mg by mouth every 12 (twelve) hours as needed for up to 15 days.    Dispense:  60 tablet    Refill:  1   Medications administered during this visit: Jaime Massey had no medications administered during this visit.  Provider-requested follow-up: Return in about 18 days (around 05/10/2024) for Left L4/5 ESI.  Future Appointments  Date Time Provider Department Center  05/10/2024  9:15 AM Margrette Taft BRAVO, MD [facility] None  07/09/2024  1:20 PM Lonni Slain, MD [facility] [id number]  10/19/2024  1:45 PM Sherryl Bouchard, NP [facility] None   I discussed the assessment and treatment plan with the patient. The patient was provided an opportunity  to ask questions and all were answered. The patient agreed with the plan and demonstrated an understanding of the instructions.  Patient advised to call back or seek an in-person evaluation if the symptoms or condition worsens. I personally spent a total of 60 minutes in the care of the patient today including  preparing to see the patient, getting/reviewing separately obtained history, performing a medically appropriate exam/evaluation, counseling and educating, placing orders, and documenting clinical information in the EHR. y reported) and communicating results to the patient/ family/caregiver Care coordination (not separately reported)  Note by: Wallie Sherry, MD (TTS and AI technology used. I apologize for any typographical errors that were not detected and corrected.) Date: 04/22/2024; Time: 3:15 PM

## 2024-05-10 ENCOUNTER — Encounter: Payer: Self-pay | Admitting: Orthopedic Surgery

## 2024-05-10 ENCOUNTER — Ambulatory Visit: Admitting: Orthopedic Surgery

## 2024-05-10 DIAGNOSIS — M1710 Unilateral primary osteoarthritis, unspecified knee: Secondary | ICD-10-CM

## 2024-05-10 DIAGNOSIS — M1711 Unilateral primary osteoarthritis, right knee: Secondary | ICD-10-CM

## 2024-05-10 DIAGNOSIS — M170 Bilateral primary osteoarthritis of knee: Secondary | ICD-10-CM | POA: Diagnosis not present

## 2024-05-10 DIAGNOSIS — M5410 Radiculopathy, site unspecified: Secondary | ICD-10-CM

## 2024-05-10 MED ORDER — METHYLPREDNISOLONE ACETATE 40 MG/ML IJ SUSP
40.0000 mg | Freq: Once | INTRAMUSCULAR | Status: AC
  Administered 2024-05-10: 40 mg via INTRA_ARTICULAR

## 2024-05-10 NOTE — Patient Instructions (Addendum)
 We are referring you to [facility] from [facility] address is [address] [place] The phone number is [phone]  The office will call you with an appointment Dr. Georgina  Joint Steroid Injection A joint steroid injection is a procedure to relieve swelling and pain in a joint. Steroids are medicines that reduce inflammation. In this procedure, your health care provider uses a syringe and a needle to inject a steroid medicine into a painful and inflamed joint. A pain-relieving medicine (anesthetic) may be injected along with the steroid. In some cases, your health care provider may use an imaging technique such as ultrasound or fluoroscopy to guide the injection. Joints that are often treated with steroid injections include the knee, shoulder, hip, and spine. These injections may also be used in the elbow, ankle, and joints of the hands or feet. You may have joint steroid injections as part of your treatment for inflammation caused by: Gout. Rheumatoid arthritis. Advanced wear-and-tear arthritis (osteoarthritis). Tendinitis. Bursitis. Joint steroid injections may be repeated, but having them too often can damage a joint or the skin over the joint. You should not have joint steroid injections less than 6 weeks apart or more than four times a year. Tell a health care provider about: Any allergies you have. All medicines you are taking, including vitamins, herbs, eye drops, creams, and over-the-counter medicines. Any problems you or family members have had with anesthetic medicines. Any blood disorders you have. Any surgeries you have had. Any medical conditions you have. Whether you are pregnant or may be pregnant. What are the risks? Generally, this is a safe treatment. However, problems may occur, including: Infection. Bleeding. Allergic reactions to medicines. Damage to the joint or tissues around the joint. Thinning of skin or loss of skin color  over the joint. Temporary flushing of the face or chest. Temporary increase in pain. Temporary increase in blood sugar. Failure to relieve inflammation or pain. What happens before the treatment? Medicines Ask your health care provider about: Changing or stopping your regular medicines. This is especially important if you are taking diabetes medicines or blood thinners. Taking medicines such as aspirin  and ibuprofen . These medicines can thin your blood. Do not take these medicines unless your health care provider tells you to take them. Taking over-the-counter medicines, vitamins, herbs, and supplements. General instructions You may have imaging tests of your joint. Ask your health care provider if you can drive yourself home after the procedure. What happens during the treatment?  Your health care provider will position you for the injection and locate the injection site over your joint. The skin over the joint will be cleaned with a germ-killing soap. Your health care provider may: Spray a numbing solution (topical anesthetic) over the injection site. Inject a local anesthetic under the skin above your joint. The needle will be placed through your skin into your joint. Your health care provider may use imaging to guide the needle to the right spot for the injection. If imaging is used, a special contrast dye may be injected to confirm that the needle is in the correct location. The steroid medicine will be injected into your joint. Anesthetic may be injected along with the steroid. This may be a medicine that relieves pain for a short time (short-acting anesthetic) or for a longer time (long-acting anesthetic). The needle will be removed, and an adhesive bandage (dressing) will be placed over the injection site. The procedure may vary among health care providers and  hospitals. What can I expect after the treatment? You will be able to go home after the treatment. It is normal to feel  slight flushing for a few days after the injection. After the treatment, it is common to have an increase in joint pain after the anesthetic has worn off. This may happen about an hour after a short-acting anesthetic or about 8 hours after a longer-acting anesthetic. You should begin to feel relief from joint pain and swelling after 24 to 48 hours. Contact your health care provider if you do not begin to feel relief after 2 days. Follow these instructions at home: Injection site care Leave the adhesive dressing over your injection site in place until your health care provider says you can remove it. Check your injection site every day for signs of infection. Check for: More redness, swelling, or pain. Fluid or blood. Warmth. Pus or a bad smell. Activity Return to your normal activities as told by your health care provider. Ask your health care provider what activities are safe for you. You may be asked to limit activities that put stress on the joint for a few days. Do joint exercises as told by your health care provider. Do not take baths, swim, or use a hot tub until your health care provider approves. Ask your health care provider if you may take showers. You may only be allowed to take sponge baths. Managing pain, stiffness, and swelling  If directed, put ice on the joint. To do this: Put ice in a plastic bag. Place a towel between your skin and the bag. Leave the ice on for 20 minutes, 2-3 times a day. Remove the ice if your skin turns bright red. This is very important. If you cannot feel pain, heat, or cold, you have a greater risk of damage to the area. Raise (elevate) your joint above the level of your heart when you are sitting or lying down. General instructions Take over-the-counter and prescription medicines only as told by your health care provider. Do not use any products that contain nicotine or tobacco, such as cigarettes, e-cigarettes, and chewing tobacco. These can delay  joint healing. If you need help quitting, ask your health care provider. If you have diabetes, be aware that your blood sugar may be slightly elevated for several days after the injection. Keep all follow-up visits. This is important. Contact a health care provider if you have: Chills or a fever. Any signs of infection at your injection site. Increased pain or swelling or no relief after 2 days. Summary A joint steroid injection is a treatment to relieve pain and swelling in a joint. Steroids are medicines that reduce inflammation. Your health care provider may add an anesthetic along with the steroid. You may have joint steroid injections as part of your arthritis treatment. Joint steroid injections may be repeated, but having them too often can damage a joint or the skin over the joint. Contact your health care provider if you have a fever, chills, or signs of infection, or if you get no relief from joint pain or swelling. This information is not intended to replace advice given to you by your health care provider. Make sure you discuss any questions you have with your health care provider. Document Revised: 12/03/2019 Document Reviewed: 12/03/2019 Elsevier Patient Education  2024 [employer].

## 2024-05-10 NOTE — Addendum Note (Signed)
 Addended byBETHA JENEAN GREIG LELON on: 05/10/2024 09:38 AM   Modules accepted: Orders

## 2024-05-10 NOTE — Progress Notes (Signed)
    05/10/2024   Chief Complaint  Patient presents with   Hip Pain    Left hip pain and swelling / painful to WB    Knee Pain    Both     No diagnosis found.  What pharmacy do you use ? [facility]________  DOI/DOS/ Date: worse last week / Thursday,     had Troch bursitis last year but this is worse  The patient reports that last Thursday something got worse??  History of trochanteric bursitis last year but it seems that this pain is worse  She was scheduled to get 2 knee injections today

## 2024-05-10 NOTE — Progress Notes (Signed)
   Patient: Jaime Massey           Date of Birth: 1975/11/13           MRN: [id number] Visit Date: 05/10/2024 Requested by: Tobie Guy, DO [address] [place],  [place] [address] PCP: Tobie Guy, DO  Encounter Diagnoses  Name Primary?   Radicular pain of left lower extremity Yes   Primary osteoarthritis of right knee    Primary localized osteoarthritis of knee-LEFT     Assessment and plan:  Jaime Massey is a 48 year old female with osteoarthritis both knees we will schedule for bilateral knee injections which we did today.  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  She was followed by [facility] neurology had an MRI which I will include in the report below.  Dr. Darleen was seeing her and moved and she could not locate her for follow-up.  She was in the urgent care facility recently had a Toradol  injection for recurrent left leg radiculopathy which helped for short period of time she was also started on Flexeril without relief  She was referred to our spine service for further management IMPRESSION: 04/15/2023: This MRI of the lumbar spine without contrast shows the following: At L4-L5, there is a left paramedian disc protrusion and mild facet hypertrophy with left ligamentum flavum hypertrophy causing moderate left lateral recess stenosis and mild spinal stenosis.  There is no definite nerve root compression though the left L5 nerve root is slightly displaced posteriorly at the lateral recess Milder degenerative changes at the other lumbar levels as detailed above not leading to spinal stenosis or nerve root compression.     No orders of the defined types were placed in this encounter.    Chief Complaint  Patient presents with   Hip Pain    Left hip pain and swelling / painful to WB    Knee Pain    Both     History:  Presents for bilateral knee injections  Focused exam findings:  Left hip pain which is located over her left lower spine  radiating down her left leg  No results found.     Procedure note for injection   Chief Complaint  Patient presents with   Hip Pain    Left hip pain and swelling / painful to WB    Knee Pain    Both      Encounter Diagnoses  Name Primary?   Radicular pain of left lower extremity Yes   Primary osteoarthritis of right knee    Primary localized osteoarthritis of knee-LEFT         The patient has consented for injection of the right and left Joint: knee  Medication: Depo-Medrol  40 mg and lidocaine  1%  Time out completed: Yes We started on the right side The site of injection was cleaned with alcohol and ethyl chloride.  The injection was given without any complications appropriate precautions were given.  We repeated this on the left side

## 2024-05-17 ENCOUNTER — Telehealth: Payer: Self-pay

## 2024-05-17 DIAGNOSIS — M5416 Radiculopathy, lumbar region: Secondary | ICD-10-CM

## 2024-05-17 NOTE — Telephone Encounter (Signed)
 Her insurance denied auth for the lesi due to no PT. She states she will do PT in [place]. There is a [facility] in  [place].

## 2024-05-25 NOTE — Telephone Encounter (Signed)
 Will you put in the PT order?

## 2024-06-16 ENCOUNTER — Encounter: Admitting: Orthopedic Surgery

## 2024-06-28 ENCOUNTER — Other Ambulatory Visit: Payer: Self-pay | Admitting: *Deleted

## 2024-06-28 MED ORDER — JOURNAVX 50 MG PO TABS: 1.0000 | ORAL_TABLET | Freq: Two times a day (BID) | ORAL | 1 refills | Status: AC | PRN

## 2024-07-05 ENCOUNTER — Other Ambulatory Visit: Payer: Self-pay | Admitting: Orthopedic Surgery

## 2024-07-09 ENCOUNTER — Encounter (HOSPITAL_BASED_OUTPATIENT_CLINIC_OR_DEPARTMENT_OTHER): Payer: Self-pay | Admitting: Cardiology

## 2024-07-09 ENCOUNTER — Ambulatory Visit (HOSPITAL_BASED_OUTPATIENT_CLINIC_OR_DEPARTMENT_OTHER): Admitting: Cardiology

## 2024-07-09 NOTE — Progress Notes (Incomplete)
 " Cardiology Office Note:  .    Date:  07/09/2024  ID:  Jaime Massey, DOB Nov 20, 1975, MRN [id number] PCP: Tobie Guy, DO  [facility] Providers Cardiologist:  Shelda Bruckner, MD     History of Present Illness: .    Jaime Massey is a 49 y.o. female with a hx of CVA treated with TNK 06/2022, who is seen for follow-up today. She was initially seen 11/25/2022 as a new consult for the evaluation and management of history of CVA, request for TEE.   History: -CVA 06/2022, [facility]: facial droop, numbness, tongue deviation. Treated with TNK with resolution of symptoms. Workup including CT angiogram, MRI, echo, A1c, cholesterol, event monitor, and transcranial dopplers was unremarkable.  -persistent left hand/left foot paresthesias -TEE 12/03/2022 which revealed tiny degenerative strands on the atrial surface of the mitral valve. 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 This likely has a lateral attachment point, P1 scallop. The mitral valve is otherwise grossly normal. Mild mitral valve regurgitation. No evidence of mitral stenosis. LVEF 50%. Trivial aortic regurgitation. Agitated saline contrast bubble study was negative, with no evidence of any interatrial shunt.    Tobacco use history: former, quit 2019 Family history: grandfather had first MI age 79, had several more, passed away late 50s/early 69s. Father has an aortic aneurysm, has been monitored for the last 14 years without growing. No other known heart disease. Great aunt had a stroke.   Today: Went back to neurologist due to persistent numbness, B12 was low, has started on supplementation.  Stopped Wegovy  at the 1.7 mg weekly dose several months ago. Wasn't able to cut back on soda, but she did eat less food. Had nausea but this did not improve with stopping Wegovy . Has gained weight since stopping. Has  noticed more yellow/bile diarrhea in the last few months. This is off GLP. She alternated constipation and diarrhea while on the GLP.   Chest pain is unchanged, sharp, brief, no clear triggers.  Denies shortness of breath at rest or with normal exertion. No PND, orthopnea, LE edema. No syncope or palpitations. ROS otherwise negative except as noted.   ROS:  Please see the history of present illness. ROS otherwise negative except as noted.   Studies Reviewed: SABRA         Physical Exam:    VS:  There were no vitals taken for this visit.   Wt Readings from Last 3 Encounters:  04/22/24 258 lb (117 kg)  03/31/24 258 lb (117 kg)  03/30/24 258 lb (117 kg)    GEN: Well nourished, well developed in no acute distress HEENT: Normal, moist mucous membranes NECK: No JVD CARDIAC: regular rhythm, normal S1 and S2, no rubs or gallops. No murmur. VASCULAR: Radial and DP pulses 2+ bilaterally. No carotid bruits RESPIRATORY:  Clear to auscultation without rales, wheezing or rhonchi  ABDOMEN: Soft, non-tender, non-distended MUSCULOSKELETAL:  Ambulates independently SKIN: Warm and dry, no edema NEUROLOGIC:  Alert and oriented x 3. No focal neuro deficits noted. PSYCHIATRIC:  Normal affect    ASSESSMENT AND PLAN: .    Chest pain, atypical -sharp, nonexertional, helped by PPI/tums, supports GI -reviewed red flag warning signs that need immediate medical attention  CVA -06/2022, treated with TNK -negative workup to date -TEE 11/2022 reviewed, mitral leaflet fibrinous strands noted. We discussed rechecking the size of the fibrinous strands by TEE; discussed pros/cons  of this. Discussed that the potential outcomes could be that the strands are gone; that the strands are the same; or that they are worse. Discussed if they are worse/concerning for embolic risk, options would be to consider open heart surgery, which comes with risk as well. -on aspirin , tolerating -was on atorvastatin for a week after her  stroke, stopped at her neurology visit post hospitalization. Her LDL is now 80, goal is <70. We discussed restarting statin    ASCVD history CVA -was on Wegovy  to decrease future CVD risk given prior ASCVD.  -Starting weight 244 lbs, current 258 lbs -now with worsening diarrhea/nausea, does not have gallbladder, has been off GLP for months -would recommend GI evaluation before retrial of GLP. Recommend she discuss with her PCP (she is pending a new PCP) to discuss   Cardiac risk counseling and prevention recommendations: -recommend heart healthy/Mediterranean diet, with whole grains, fruits, vegetable, fish, lean meats, nuts, and olive oil. Limit salt. -recommend moderate walking, 3-5 times/week for 30-50 minutes each session. Aim for at least 150 minutes.week. Goal should be pace of 3 miles/hours, or walking 1.5 miles in 30 minutes -recommend avoidance of tobacco products. Avoid excess alcohol.  Dispo: Follow-up in 6 months, or sooner as needed. In the interim, she will meet with new PCP and discuss GI referral. If GI symptoms improve, would re-trial Wegovy .  Signed, Shelda Bruckner, MD   "

## 2024-08-02 ENCOUNTER — Encounter: Admitting: Orthopedic Surgery

## 2024-08-04 ENCOUNTER — Other Ambulatory Visit (INDEPENDENT_AMBULATORY_CARE_PROVIDER_SITE_OTHER): Payer: Self-pay

## 2024-08-04 ENCOUNTER — Ambulatory Visit: Admitting: Orthopedic Surgery

## 2024-08-04 VITALS — BP 134/73 | HR 96 | Ht 69.0 in | Wt 261.8 lb

## 2024-08-04 DIAGNOSIS — M545 Low back pain, unspecified: Secondary | ICD-10-CM

## 2024-08-04 NOTE — Progress Notes (Signed)
 Orthopedic Spine Surgery Office Note  Assessment: Patient is a 49 y.o. female with chronic low back pain   Plan: -Explained that initially conservative treatment is tried as a significant number of patients may experience relief with these treatment modalities. Discussed that the conservative treatments include:  -activity modification  -physical therapy  -over the counter pain medications  -medrol  dosepak  -steroid injections -Patient has tried tylenol , ibuprofen , oral steroids, intramuscular toradol   -Recommended diagnostic/therapeutic facet injections. If she does well with those, could consider RFA in the future -Her lateral leg pain may be radicular in nature but I would want to do a diagnostic injection before considering any kind of decompression surgery since it only hurts when she lays on that side which does not sound radicular from a history stand point -Patient should return to office on an as needed basis   Patient expressed understanding of the plan and all questions were answered to the patient's satisfaction.   ___________________________________________________________________________   History:  Patient is a 48 y.o. female who presents today for lumbar spine. Patient has a history of chronic lower back pain. She feels it in the lower lumbar region. There was no trauma or injury that preceded the onset of the pain. She notes the pain is worse if she is standing or walking. It gets better if she sits or lays down. She also has noticed left lateral thigh pain. It does not radiate past the knee. She notes it when she lays on that side. She has no right side lower extremity pain. Her back pain is her main issue. No bowel or bladder incontinence. No saddle anesthesia. Has numbness and paresthesias in her left hand and foot for which she underwent work up with neurology. She was told it was residual symptoms from a prior stroke. Patient rates her pain as a 7/10 at its worst.    Treatments tried: tylenol , ibuprofen , oral steroids, intramuscular toradol    Review of systems: Denies fevers and chills, night sweats, unexplained weight loss, history of cancer, pain that wakes them at night  Past medical history: History of stroke Migraines History of DVT/PE Depression/anxiety IBS Chronic pain  Allergies: amoxicillin, topiramate , penicillin  Past surgical history:  C section Cholecystectomy Right knee partial meniscectomy  Social history: Denies use of nicotine product (smoking, vaping, patches, smokeless) Alcohol use: denies Denies recreational drug use   Physical Exam:  BMI of 38.7  General: no acute distress, appears stated age Neurologic: alert, answering questions appropriately, following commands Respiratory: unlabored breathing on room air, symmetric chest rise Psychiatric: appropriate affect, normal cadence to speech   MSK (spine):  -Strength exam      Left  Right EHL    5/5  5/5 TA    5/5  5/5 GSC    5/5  5/5 Knee extension  5/5  5/5 Hip flexion   5/5  5/5  -Sensory exam    Sensation intact to light touch in L3-S1 nerve distributions of bilateral lower extremities  -Achilles DTR: 2/4 on the left, 2/4 on the right -Patellar tendon DTR: 2/4 on the left, 2/4 on the right  -Straight leg raise: negative bilaterally -Clonus: no beats bilaterally  -Left hip exam: no pain through range of motion  -Right hip exam: no pain through range of motion  Imaging: XRs of the lumbar spine from 08/04/2024 were independently reviewed and interpreted, showing disc height loss with anterior osteophyte formation at L2/3. No other significant degenerative changes seen. No evidence of instability on flexion/extension views.  No fracture or dislocation seen.   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 No other  stenosis seen. Facet arthropathy at L4/5 and L5/S1.    Patient name: Jaime Massey Patient MRN: [id number] Date of visit: 08/04/24

## 2024-08-11 ENCOUNTER — Ambulatory Visit: Admitting: Orthopedic Surgery

## 2024-08-16 ENCOUNTER — Ambulatory Visit: Admitting: Physical Medicine and Rehabilitation

## 2024-08-23 ENCOUNTER — Ambulatory Visit: Admitting: Orthopedic Surgery

## 2024-09-17 ENCOUNTER — Ambulatory Visit (HOSPITAL_BASED_OUTPATIENT_CLINIC_OR_DEPARTMENT_OTHER): Admitting: Cardiology

## 2024-10-19 ENCOUNTER — Telehealth: Admitting: Adult Health
